# Patient Record
Sex: Male | Born: 1960
Health system: Southern US, Community
[De-identification: ages and names within clinical notes are randomized; demographics above are authoritative.]

## PROBLEM LIST (undated history)

## (undated) DIAGNOSIS — E113511 Type 2 diabetes mellitus with proliferative diabetic retinopathy with macular edema, right eye: Secondary | ICD-10-CM

## (undated) DIAGNOSIS — I10 Essential (primary) hypertension: Secondary | ICD-10-CM

## (undated) DIAGNOSIS — I25118 Atherosclerotic heart disease of native coronary artery with other forms of angina pectoris: Secondary | ICD-10-CM

## (undated) DIAGNOSIS — J069 Acute upper respiratory infection, unspecified: Secondary | ICD-10-CM

## (undated) DIAGNOSIS — E782 Mixed hyperlipidemia: Secondary | ICD-10-CM

## (undated) DIAGNOSIS — Z794 Long term (current) use of insulin: Secondary | ICD-10-CM

## (undated) DIAGNOSIS — E119 Type 2 diabetes mellitus without complications: Secondary | ICD-10-CM

## (undated) DIAGNOSIS — R059 Cough, unspecified: Secondary | ICD-10-CM

## (undated) DIAGNOSIS — I219 Acute myocardial infarction, unspecified: Secondary | ICD-10-CM

## (undated) DIAGNOSIS — Z803 Family history of malignant neoplasm of breast: Secondary | ICD-10-CM

## (undated) DIAGNOSIS — Z8719 Personal history of other diseases of the digestive system: Secondary | ICD-10-CM

## (undated) DIAGNOSIS — E785 Hyperlipidemia, unspecified: Secondary | ICD-10-CM

## (undated) DIAGNOSIS — E039 Hypothyroidism, unspecified: Secondary | ICD-10-CM

## (undated) DIAGNOSIS — C61 Malignant neoplasm of prostate: Secondary | ICD-10-CM

## (undated) DIAGNOSIS — Z800 Family history of malignant neoplasm of digestive organs: Secondary | ICD-10-CM

## (undated) HISTORY — DX: Family history of malignant neoplasm of digestive organs: Z80.0

## (undated) HISTORY — DX: Family history of malignant neoplasm of breast: Z80.3

## (undated) HISTORY — DX: Long term (current) use of insulin: E11.3511

## (undated) HISTORY — DX: Acute upper respiratory infection, unspecified: J06.9

## (undated) HISTORY — DX: Personal history of other diseases of the digestive system: Z87.19

## (undated) HISTORY — DX: Cough, unspecified: R05.9

## (undated) HISTORY — DX: Type 2 diabetes mellitus with proliferative diabetic retinopathy with macular edema, right eye: E11.3511

## (undated) HISTORY — PX: CARDIAC CATHETERIZATION: SHX172

## (undated) HISTORY — PX: SHOULDER SURGERY: SHX246

## (undated) HISTORY — PX: RETINAL DETACHMENT SURGERY: SHX105

## (undated) HISTORY — DX: Atherosclerotic heart disease of native coronary artery with other forms of angina pectoris: I25.118

## (undated) HISTORY — DX: Hypothyroidism, unspecified: E03.9

## (undated) HISTORY — DX: Mixed hyperlipidemia: E78.2

## (undated) HISTORY — PX: APPENDECTOMY: SHX54

## (undated) HISTORY — DX: Long term (current) use of insulin: Z79.4

## (undated) HISTORY — PX: PROSTATE BIOPSY: SHX241

## (undated) MED FILL — Leuprolide Acetate (4 Month) For Subcutaneous Inj Kit 30 MG: SUBCUTANEOUS | Qty: 30 | Status: AC

---

## 1998-05-24 ENCOUNTER — Ambulatory Visit: Admission: RE | Admit: 1998-05-24 | Discharge: 1998-05-24 | Payer: Self-pay | Admitting: Pulmonary Disease

## 1998-08-09 ENCOUNTER — Ambulatory Visit: Admission: RE | Admit: 1998-08-09 | Discharge: 1998-08-09 | Payer: Self-pay | Admitting: Pulmonary Disease

## 2003-02-07 ENCOUNTER — Emergency Department (HOSPITAL_COMMUNITY): Admission: EM | Admit: 2003-02-07 | Discharge: 2003-02-07 | Payer: Self-pay | Admitting: Emergency Medicine

## 2012-12-19 DIAGNOSIS — I219 Acute myocardial infarction, unspecified: Secondary | ICD-10-CM

## 2012-12-19 HISTORY — PX: CARDIAC CATHETERIZATION: SHX172

## 2012-12-19 HISTORY — DX: Acute myocardial infarction, unspecified: I21.9

## 2013-08-02 DIAGNOSIS — E119 Type 2 diabetes mellitus without complications: Secondary | ICD-10-CM | POA: Insufficient documentation

## 2013-08-02 DIAGNOSIS — E785 Hyperlipidemia, unspecified: Secondary | ICD-10-CM | POA: Insufficient documentation

## 2013-08-02 HISTORY — DX: Type 2 diabetes mellitus without complications: E11.9

## 2013-10-04 DIAGNOSIS — I2510 Atherosclerotic heart disease of native coronary artery without angina pectoris: Secondary | ICD-10-CM

## 2013-10-04 HISTORY — DX: Atherosclerotic heart disease of native coronary artery without angina pectoris: I25.10

## 2016-03-19 ENCOUNTER — Emergency Department (HOSPITAL_COMMUNITY)
Admission: EM | Admit: 2016-03-19 | Discharge: 2016-03-19 | Disposition: A | Discharge status: Home / Self Care | Payer: BLUE CROSS/BLUE SHIELD | Attending: Emergency Medicine | Admitting: Emergency Medicine

## 2016-03-19 ENCOUNTER — Encounter (HOSPITAL_COMMUNITY): Payer: Self-pay

## 2016-03-19 DIAGNOSIS — E119 Type 2 diabetes mellitus without complications: Secondary | ICD-10-CM | POA: Insufficient documentation

## 2016-03-19 DIAGNOSIS — S41112A Laceration without foreign body of left upper arm, initial encounter: Secondary | ICD-10-CM | POA: Diagnosis not present

## 2016-03-19 DIAGNOSIS — Z9889 Other specified postprocedural states: Secondary | ICD-10-CM | POA: Diagnosis not present

## 2016-03-19 DIAGNOSIS — Y9389 Activity, other specified: Secondary | ICD-10-CM | POA: Insufficient documentation

## 2016-03-19 DIAGNOSIS — Z23 Encounter for immunization: Secondary | ICD-10-CM | POA: Diagnosis not present

## 2016-03-19 DIAGNOSIS — W208XXA Other cause of strike by thrown, projected or falling object, initial encounter: Secondary | ICD-10-CM | POA: Insufficient documentation

## 2016-03-19 DIAGNOSIS — I252 Old myocardial infarction: Secondary | ICD-10-CM | POA: Insufficient documentation

## 2016-03-19 DIAGNOSIS — I10 Essential (primary) hypertension: Secondary | ICD-10-CM | POA: Diagnosis not present

## 2016-03-19 DIAGNOSIS — S6992XA Unspecified injury of left wrist, hand and finger(s), initial encounter: Secondary | ICD-10-CM | POA: Diagnosis present

## 2016-03-19 DIAGNOSIS — Y998 Other external cause status: Secondary | ICD-10-CM | POA: Insufficient documentation

## 2016-03-19 DIAGNOSIS — S51812A Laceration without foreign body of left forearm, initial encounter: Secondary | ICD-10-CM | POA: Insufficient documentation

## 2016-03-19 DIAGNOSIS — IMO0002 Reserved for concepts with insufficient information to code with codable children: Secondary | ICD-10-CM

## 2016-03-19 DIAGNOSIS — Y9289 Other specified places as the place of occurrence of the external cause: Secondary | ICD-10-CM | POA: Diagnosis not present

## 2016-03-19 HISTORY — DX: Essential (primary) hypertension: I10

## 2016-03-19 HISTORY — DX: Hyperlipidemia, unspecified: E78.5

## 2016-03-19 HISTORY — DX: Acute myocardial infarction, unspecified: I21.9

## 2016-03-19 HISTORY — DX: Type 2 diabetes mellitus without complications: E11.9

## 2016-03-19 MED ORDER — LIDOCAINE-EPINEPHRINE (PF) 2 %-1:200000 IJ SOLN
10.0000 mL | Freq: Once | INTRAMUSCULAR | Status: AC
  Administered 2016-03-19: 10 mL
  Filled 2016-03-19: qty 20

## 2016-03-19 MED ORDER — TETANUS-DIPHTH-ACELL PERTUSSIS 5-2.5-18.5 LF-MCG/0.5 IM SUSP
0.5000 mL | Freq: Once | INTRAMUSCULAR | Status: AC
  Administered 2016-03-19: 0.5 mL via INTRAMUSCULAR
  Filled 2016-03-19: qty 0.5

## 2016-03-19 MED ORDER — CEPHALEXIN 500 MG PO CAPS
500.0000 mg | ORAL_CAPSULE | Freq: Four times a day (QID) | ORAL | Status: DC
Start: 2016-03-19 — End: 2017-07-28

## 2016-03-19 NOTE — Discharge Instructions (Signed)
[name],  Nice meeting you! Please follow-up with your primary care provider or elsewhere to have your sutures removed in 10 days. Return to the emergency department if you develop red streaking around the wound, yellow/green drainage, fevers/chills. Feel better soon!  S. Wendie Simmer, PA-C

## 2016-03-19 NOTE — ED Notes (Signed)
Pt here with a laceration to the left antecubital region. He was removing a window when the window fell and cut his left arm. Pt feels it cut his artery due to blood squirting and bleeding perfusely. Pt denies pain. Seen at [facility] and was sent here. Pt takes Brilinta due to previous MI.

## 2016-03-19 NOTE — ED Provider Notes (Signed)
CSN: [id number]     Arrival date & time 03/19/16  1258 History   First MD Initiated Contact with Patient 03/19/16 1324     Chief Complaint  Patient presents with  . Extremity Laceration   HPI Adam Cruz is a 55 y.o. male PMH significant for MI, DM, hyperlipidemia, HTN presenting with a left antecubital laceration. He states he was removing a window when the window fell and landed on his left arm. The window was still intact. He endorsed arterial bleeding, (blood squirting and bleeding profusely). He was seen at urgent care and sent here for concern for arterial bleeding. He takes Brilinta for a previous MI.   Past Medical History  Diagnosis Date  . MI (myocardial infarction) ([place])   . Diabetes mellitus without complication ([place])   . Hyperlipidemia   . Hypertension    Past Surgical History  Procedure Laterality Date  . Cardiac catheterization    . Appendectomy     History reviewed. No pertinent family history. Social History  Substance Use Topics  . Smoking status: Never Smoker   . Smokeless tobacco: None  . Alcohol Use: No    Review of Systems  Ten systems are reviewed and are negative for acute change except as noted in the HPI  Allergies  Review of patient's allergies indicates no known allergies.  Home Medications   Prior to Admission medications   Not on File   BP 120/81 mmHg  Pulse 93  Temp(Src) 98.3 F (36.8 C) (Oral)  Resp 16  SpO2 96% Physical Exam  Constitutional: He appears well-developed and well-nourished. No distress.  HENT:  Head: Normocephalic and atraumatic.  Eyes: Conjunctivae are normal. Right eye exhibits no discharge. Left eye exhibits no discharge. No scleral icterus.  Neck: No tracheal deviation present.  Cardiovascular: Normal rate, regular rhythm and intact distal pulses.   Pulmonary/Chest: Effort normal. No respiratory distress.  Abdominal: Soft. Bowel sounds are normal. He exhibits no distension and no mass. There is no  tenderness. There is no rebound and no guarding.  Musculoskeletal: Normal range of motion. He exhibits no edema.  NVI BL.   Lymphadenopathy:    He has no cervical adenopathy.  Neurological: He is alert. Coordination normal.  Skin: Skin is warm and dry. No rash noted. He is not diaphoretic. No erythema.  1.5 cm laceration at left antecubital. Bleeding controlled. Large clot visualized. No pulsatile hematoma.   Psychiatric: He has a normal mood and affect. His behavior is normal.  Nursing note and vitals reviewed.   ED Course  .Marland KitchenLaceration Repair Date/Time: 03/19/2016 2:32 PM Performed by: Alisia Ferrari NICOLE Authorized by: [place] Consent: Verbal consent obtained. Consent given by: patient Patient understanding: patient states understanding of the procedure being performed Patient identity confirmed: verbally with patient Body area: upper extremity Location details: left upper arm Laceration length: 1.5 cm Foreign bodies: no foreign bodies Tendon involvement: none Nerve involvement: none Anesthesia: local infiltration Local anesthetic: lidocaine 2% with epinephrine Preparation: Patient was prepped and draped in the usual sterile fashion. Irrigation solution: saline Irrigation method: syringe Amount of cleaning: standard Skin closure: 4-0 Prolene Number of sutures: 3 Technique: simple Approximation: close Approximation difficulty: simple Dressing: antibiotic ointment, 4x4 sterile gauze, gauze roll and pressure dressing Patient tolerance: Patient tolerated the procedure well with no immediate complications       MDM   Final diagnoses:  Laceration   Dr. Tomi Bamberger evaluated patient as well. No concern for current arterial bleeding, as clot is visualized  and bleeding is controlled. Patient is neurovascularly intact BL.  Pressure irrigation performed. Wound explored and base of wound visualized in a bloodless field without evidence of foreign body.  Laceration  occurred < 8 hours prior to repair which was well tolerated. Tdap updated.  Pt discharged with keflex. Discussed suture home care with patient and answered questions. Pt to follow-up for wound check and suture removal in 10-14 days; they are to return to the ED sooner for signs of infection. Pt is hemodynamically stable with no complaints prior to dc.    Ivor Lions, PA-C 03/19/16 1443  Dorie Rank, MD 03/19/16 [phone]

## 2016-04-08 DIAGNOSIS — I10 Essential (primary) hypertension: Secondary | ICD-10-CM | POA: Diagnosis not present

## 2016-04-08 DIAGNOSIS — E039 Hypothyroidism, unspecified: Secondary | ICD-10-CM | POA: Diagnosis not present

## 2016-04-08 DIAGNOSIS — E559 Vitamin D deficiency, unspecified: Secondary | ICD-10-CM | POA: Diagnosis not present

## 2016-04-08 DIAGNOSIS — E1165 Type 2 diabetes mellitus with hyperglycemia: Secondary | ICD-10-CM | POA: Diagnosis not present

## 2016-06-17 DIAGNOSIS — E559 Vitamin D deficiency, unspecified: Secondary | ICD-10-CM | POA: Diagnosis not present

## 2016-06-17 DIAGNOSIS — E785 Hyperlipidemia, unspecified: Secondary | ICD-10-CM | POA: Diagnosis not present

## 2016-06-17 DIAGNOSIS — E1165 Type 2 diabetes mellitus with hyperglycemia: Secondary | ICD-10-CM | POA: Diagnosis not present

## 2016-06-17 DIAGNOSIS — I10 Essential (primary) hypertension: Secondary | ICD-10-CM | POA: Diagnosis not present

## 2016-07-18 DIAGNOSIS — E1165 Type 2 diabetes mellitus with hyperglycemia: Secondary | ICD-10-CM | POA: Diagnosis not present

## 2016-07-18 DIAGNOSIS — I2510 Atherosclerotic heart disease of native coronary artery without angina pectoris: Secondary | ICD-10-CM | POA: Diagnosis not present

## 2016-07-18 DIAGNOSIS — E785 Hyperlipidemia, unspecified: Secondary | ICD-10-CM | POA: Diagnosis not present

## 2016-07-18 DIAGNOSIS — E039 Hypothyroidism, unspecified: Secondary | ICD-10-CM | POA: Diagnosis not present

## 2016-07-21 DIAGNOSIS — H25813 Combined forms of age-related cataract, bilateral: Secondary | ICD-10-CM | POA: Diagnosis not present

## 2016-07-21 DIAGNOSIS — E113213 Type 2 diabetes mellitus with mild nonproliferative diabetic retinopathy with macular edema, bilateral: Secondary | ICD-10-CM | POA: Diagnosis not present

## 2016-08-03 DIAGNOSIS — E113412 Type 2 diabetes mellitus with severe nonproliferative diabetic retinopathy with macular edema, left eye: Secondary | ICD-10-CM | POA: Diagnosis not present

## 2016-08-10 DIAGNOSIS — E113412 Type 2 diabetes mellitus with severe nonproliferative diabetic retinopathy with macular edema, left eye: Secondary | ICD-10-CM | POA: Diagnosis not present

## 2016-09-11 DIAGNOSIS — I252 Old myocardial infarction: Secondary | ICD-10-CM | POA: Diagnosis not present

## 2016-09-11 DIAGNOSIS — T22211A Burn of second degree of right forearm, initial encounter: Secondary | ICD-10-CM | POA: Diagnosis not present

## 2016-09-11 DIAGNOSIS — T24201A Burn of second degree of unspecified site of right lower limb, except ankle and foot, initial encounter: Secondary | ICD-10-CM | POA: Diagnosis not present

## 2016-09-11 DIAGNOSIS — E119 Type 2 diabetes mellitus without complications: Secondary | ICD-10-CM | POA: Diagnosis not present

## 2016-09-11 DIAGNOSIS — T23201A Burn of second degree of right hand, unspecified site, initial encounter: Secondary | ICD-10-CM | POA: Diagnosis not present

## 2016-09-11 DIAGNOSIS — T24231A Burn of second degree of right lower leg, initial encounter: Secondary | ICD-10-CM | POA: Diagnosis not present

## 2016-09-11 DIAGNOSIS — T310 Burns involving less than 10% of body surface: Secondary | ICD-10-CM | POA: Diagnosis not present

## 2016-09-11 DIAGNOSIS — Z955 Presence of coronary angioplasty implant and graft: Secondary | ICD-10-CM | POA: Diagnosis not present

## 2016-09-11 DIAGNOSIS — T2200XA Burn of unspecified degree of shoulder and upper limb, except wrist and hand, unspecified site, initial encounter: Secondary | ICD-10-CM | POA: Diagnosis not present

## 2016-10-04 DIAGNOSIS — T24201D Burn of second degree of unspecified site of right lower limb, except ankle and foot, subsequent encounter: Secondary | ICD-10-CM | POA: Diagnosis not present

## 2016-10-04 DIAGNOSIS — T310 Burns involving less than 10% of body surface: Secondary | ICD-10-CM

## 2016-10-04 DIAGNOSIS — G8911 Acute pain due to trauma: Secondary | ICD-10-CM

## 2016-10-04 HISTORY — DX: Burns involving less than 10% of body surface: T31.0

## 2016-10-04 HISTORY — DX: Acute pain due to trauma: G89.11

## 2016-10-24 DIAGNOSIS — E1140 Type 2 diabetes mellitus with diabetic neuropathy, unspecified: Secondary | ICD-10-CM | POA: Diagnosis not present

## 2016-10-24 DIAGNOSIS — E785 Hyperlipidemia, unspecified: Secondary | ICD-10-CM | POA: Diagnosis not present

## 2016-10-24 DIAGNOSIS — E0851 Diabetes mellitus due to underlying condition with diabetic peripheral angiopathy without gangrene: Secondary | ICD-10-CM | POA: Diagnosis not present

## 2016-10-24 DIAGNOSIS — E1165 Type 2 diabetes mellitus with hyperglycemia: Secondary | ICD-10-CM | POA: Diagnosis not present

## 2016-11-17 DIAGNOSIS — E113412 Type 2 diabetes mellitus with severe nonproliferative diabetic retinopathy with macular edema, left eye: Secondary | ICD-10-CM | POA: Diagnosis not present

## 2016-11-17 DIAGNOSIS — E113213 Type 2 diabetes mellitus with mild nonproliferative diabetic retinopathy with macular edema, bilateral: Secondary | ICD-10-CM | POA: Diagnosis not present

## 2017-02-10 DIAGNOSIS — H6690 Otitis media, unspecified, unspecified ear: Secondary | ICD-10-CM | POA: Diagnosis not present

## 2017-02-10 DIAGNOSIS — J40 Bronchitis, not specified as acute or chronic: Secondary | ICD-10-CM | POA: Diagnosis not present

## 2017-02-21 DIAGNOSIS — E1165 Type 2 diabetes mellitus with hyperglycemia: Secondary | ICD-10-CM | POA: Diagnosis not present

## 2017-02-21 DIAGNOSIS — E039 Hypothyroidism, unspecified: Secondary | ICD-10-CM | POA: Diagnosis not present

## 2017-02-21 DIAGNOSIS — E1140 Type 2 diabetes mellitus with diabetic neuropathy, unspecified: Secondary | ICD-10-CM | POA: Diagnosis not present

## 2017-02-21 DIAGNOSIS — I10 Essential (primary) hypertension: Secondary | ICD-10-CM | POA: Diagnosis not present

## 2017-04-05 DIAGNOSIS — E291 Testicular hypofunction: Secondary | ICD-10-CM | POA: Diagnosis not present

## 2017-04-05 DIAGNOSIS — E1140 Type 2 diabetes mellitus with diabetic neuropathy, unspecified: Secondary | ICD-10-CM | POA: Diagnosis not present

## 2017-04-05 DIAGNOSIS — E785 Hyperlipidemia, unspecified: Secondary | ICD-10-CM | POA: Diagnosis not present

## 2017-04-05 DIAGNOSIS — I10 Essential (primary) hypertension: Secondary | ICD-10-CM | POA: Diagnosis not present

## 2017-05-03 DIAGNOSIS — M9902 Segmental and somatic dysfunction of thoracic region: Secondary | ICD-10-CM | POA: Diagnosis not present

## 2017-05-03 DIAGNOSIS — M9903 Segmental and somatic dysfunction of lumbar region: Secondary | ICD-10-CM | POA: Diagnosis not present

## 2017-05-03 DIAGNOSIS — M9905 Segmental and somatic dysfunction of pelvic region: Secondary | ICD-10-CM | POA: Diagnosis not present

## 2017-05-03 DIAGNOSIS — M5441 Lumbago with sciatica, right side: Secondary | ICD-10-CM | POA: Diagnosis not present

## 2017-05-05 DIAGNOSIS — M9902 Segmental and somatic dysfunction of thoracic region: Secondary | ICD-10-CM | POA: Diagnosis not present

## 2017-05-05 DIAGNOSIS — M5441 Lumbago with sciatica, right side: Secondary | ICD-10-CM | POA: Diagnosis not present

## 2017-05-05 DIAGNOSIS — M9903 Segmental and somatic dysfunction of lumbar region: Secondary | ICD-10-CM | POA: Diagnosis not present

## 2017-05-05 DIAGNOSIS — M9905 Segmental and somatic dysfunction of pelvic region: Secondary | ICD-10-CM | POA: Diagnosis not present

## 2017-05-29 DIAGNOSIS — N521 Erectile dysfunction due to diseases classified elsewhere: Secondary | ICD-10-CM | POA: Diagnosis not present

## 2017-05-29 DIAGNOSIS — I10 Essential (primary) hypertension: Secondary | ICD-10-CM | POA: Diagnosis not present

## 2017-05-29 DIAGNOSIS — E1140 Type 2 diabetes mellitus with diabetic neuropathy, unspecified: Secondary | ICD-10-CM | POA: Diagnosis not present

## 2017-05-29 DIAGNOSIS — Z1389 Encounter for screening for other disorder: Secondary | ICD-10-CM | POA: Diagnosis not present

## 2017-05-29 DIAGNOSIS — E785 Hyperlipidemia, unspecified: Secondary | ICD-10-CM | POA: Diagnosis not present

## 2017-05-29 DIAGNOSIS — Z0000 Encounter for general adult medical examination without abnormal findings: Secondary | ICD-10-CM | POA: Diagnosis not present

## 2017-06-02 DIAGNOSIS — R972 Elevated prostate specific antigen [PSA]: Secondary | ICD-10-CM | POA: Diagnosis not present

## 2017-06-02 DIAGNOSIS — N3000 Acute cystitis without hematuria: Secondary | ICD-10-CM | POA: Diagnosis not present

## 2017-06-08 ENCOUNTER — Other Ambulatory Visit: Payer: Self-pay | Admitting: Urology

## 2017-06-08 DIAGNOSIS — N4232 Atypical small acinar proliferation of prostate: Secondary | ICD-10-CM | POA: Diagnosis not present

## 2017-06-08 DIAGNOSIS — R972 Elevated prostate specific antigen [PSA]: Secondary | ICD-10-CM | POA: Diagnosis not present

## 2017-06-08 DIAGNOSIS — C61 Malignant neoplasm of prostate: Secondary | ICD-10-CM | POA: Diagnosis not present

## 2017-06-15 DIAGNOSIS — C61 Malignant neoplasm of prostate: Secondary | ICD-10-CM | POA: Diagnosis not present

## 2017-06-19 DIAGNOSIS — C61 Malignant neoplasm of prostate: Secondary | ICD-10-CM | POA: Diagnosis not present

## 2017-06-20 DIAGNOSIS — C61 Malignant neoplasm of prostate: Secondary | ICD-10-CM | POA: Diagnosis not present

## 2017-06-26 DIAGNOSIS — C61 Malignant neoplasm of prostate: Secondary | ICD-10-CM | POA: Diagnosis not present

## 2017-07-07 DIAGNOSIS — C61 Malignant neoplasm of prostate: Secondary | ICD-10-CM | POA: Diagnosis not present

## 2017-07-20 ENCOUNTER — Telehealth: Payer: Self-pay | Admitting: Medical Oncology

## 2017-07-20 NOTE — Telephone Encounter (Signed)
I called pt to introduce myself as the Prostate Nurse Navigator and the Coordinator of the Prostate [place].  1. I confirmed with the patient he is aware of his referral to the clinic 07/28/17 arriving between 7:45-8:00 am.  2. I discussed the format of the clinic and the physicians he will be seeing that day.  3. I discussed where the clinic is located and how to contact me.  4. I confirmed his address and informed him I would be mailing a packet of information and forms to be completed. I asked him to bring them with him the day of his appointment.   He voiced understanding of the above. I asked him to call me if he has any questions or concerns regarding his appointments or the forms he needs to complete.

## 2017-07-21 ENCOUNTER — Telehealth: Payer: Self-pay | Admitting: Medical Oncology

## 2017-07-21 NOTE — Telephone Encounter (Signed)
Called and faxed request for prostate biopsy slides for Prostate MDC

## 2017-07-24 ENCOUNTER — Encounter: Payer: Self-pay | Admitting: Medical Oncology

## 2017-07-24 DIAGNOSIS — C61 Malignant neoplasm of prostate: Secondary | ICD-10-CM | POA: Diagnosis not present

## 2017-07-26 ENCOUNTER — Encounter: Payer: Self-pay | Admitting: Radiation Oncology

## 2017-07-26 NOTE — Progress Notes (Signed)
GU Location of Tumor / Histology: prostatic adenocarcinoma  If Prostate Cancer, Gleason Score is (4 + 4) and PSA is (66) in June 2018. Prostate volume: 28.24 grams.   Adam Cruz was referred to Dr. Emi Holes by Dr. Reesa Chew for further evaluation of an elevated PSA and LUTS.  Biopsies of prostate (if applicable) revealed:    Past/Anticipated interventions by urology, if any: prostate biopsy, CT which revealed RP and bulky pelvic LAD. Negative bone scan. Casodex and Lupron. Referral to [facility].   Past/Anticipated interventions by medical oncology, if any: no  Weight changes, if any: no  Bowel/Bladder complaints, if any: urgency, frequency, hesitancy, slow urine stream  Nausea/Vomiting, if any: no  Pain issues, if any:  Back pain  SAFETY ISSUES:  Prior radiation? no  Pacemaker/ICD? no  Possible current pregnancy? no  Is the patient on methotrexate? no  Current Complaints / other details:  56 year old male. Married with one son and one daughter. Strong family hx of ca. NKDA.

## 2017-07-27 ENCOUNTER — Telehealth: Payer: Self-pay | Admitting: Medical Oncology

## 2017-07-27 NOTE — Telephone Encounter (Signed)
Called [name] to confirm appointment for Prostate MDC. I reviewed location, registration and reminded him to bring his completed medical forms. He voiced understanding.

## 2017-07-27 NOTE — Progress Notes (Signed)
Adam Cruz called stating he received a call from [facility] informing him he has an appointment with Dr. Alinda Money at 9 am 07/28/17. He is confused. I explained he will be seeing Dr. Alinda Money in the Prostate [place] here at [facility] 07/28/17. He voiced understanding.

## 2017-07-28 ENCOUNTER — Encounter: Payer: Self-pay | Admitting: Medical Oncology

## 2017-07-28 ENCOUNTER — Encounter: Payer: Self-pay | Admitting: General Practice

## 2017-07-28 ENCOUNTER — Ambulatory Visit
Admission: RE | Admit: 2017-07-28 | Discharge: 2017-07-28 | Disposition: A | Discharge status: Home / Self Care | Payer: BLUE CROSS/BLUE SHIELD | Source: Ambulatory Visit | Attending: Radiation Oncology | Admitting: Radiation Oncology

## 2017-07-28 ENCOUNTER — Encounter: Payer: Self-pay | Admitting: Radiation Oncology

## 2017-07-28 ENCOUNTER — Ambulatory Visit (HOSPITAL_BASED_OUTPATIENT_CLINIC_OR_DEPARTMENT_OTHER): Payer: BLUE CROSS/BLUE SHIELD | Admitting: Oncology

## 2017-07-28 VITALS — BP 136/83 | HR 75 | Resp 16 | Ht 71.5 in | Wt 221.4 lb

## 2017-07-28 DIAGNOSIS — R972 Elevated prostate specific antigen [PSA]: Secondary | ICD-10-CM | POA: Diagnosis not present

## 2017-07-28 DIAGNOSIS — C61 Malignant neoplasm of prostate: Secondary | ICD-10-CM

## 2017-07-28 DIAGNOSIS — Z800 Family history of malignant neoplasm of digestive organs: Secondary | ICD-10-CM

## 2017-07-28 DIAGNOSIS — Z801 Family history of malignant neoplasm of trachea, bronchus and lung: Secondary | ICD-10-CM

## 2017-07-28 DIAGNOSIS — Z803 Family history of malignant neoplasm of breast: Secondary | ICD-10-CM | POA: Diagnosis not present

## 2017-07-28 DIAGNOSIS — Z1501 Genetic susceptibility to malignant neoplasm of breast: Secondary | ICD-10-CM | POA: Diagnosis not present

## 2017-07-28 DIAGNOSIS — Z79818 Long term (current) use of other agents affecting estrogen receptors and estrogen levels: Secondary | ICD-10-CM | POA: Diagnosis not present

## 2017-07-28 DIAGNOSIS — I219 Acute myocardial infarction, unspecified: Secondary | ICD-10-CM | POA: Diagnosis not present

## 2017-07-28 DIAGNOSIS — E119 Type 2 diabetes mellitus without complications: Secondary | ICD-10-CM

## 2017-07-28 DIAGNOSIS — M545 Low back pain: Secondary | ICD-10-CM | POA: Diagnosis not present

## 2017-07-28 DIAGNOSIS — C778 Secondary and unspecified malignant neoplasm of lymph nodes of multiple regions: Secondary | ICD-10-CM

## 2017-07-28 HISTORY — DX: Malignant neoplasm of prostate: C61

## 2017-07-28 MED ORDER — ABIRATERONE ACETATE 250 MG PO TABS: 1000.0000 mg | ORAL_TABLET | Freq: Every day | ORAL | 0 refills | Status: DC

## 2017-07-28 NOTE — Consult Note (Signed)
[facility]     07/28/2017     --------------------------------------------------------------------------------     Adam Cruz   MRN: [id number]  PRIMARY CARE:  [name], MD   DOB: [birth year], 56 year old Male Male  REFERRING:  [name], MD   SSN: [id number]  PROVIDER:  Festus Aloe, M.D.     TREATING:  Raynelle Bring, M.D.     LOCATION:  [facility] [phone]     --------------------------------------------------------------------------------     CC/HPI: CC: Prostate Cancer     Physician requesting consult: Dr. Eda Keys   PCP: Dr. [name]   Location of consult: [facility] - Prostate Cancer [facility]     Adam Cruz is a 56 year old gentleman who was found to have a screening PSA of 66.7. This prompted a urologic referral to Dr. Emi Holes in [place] who performed a TRUS biopsy of the prostate on 06/08/17 that confirmed Gleason 4+4=8 adenocarcinoma. Staging CT imaging revealed large, bulky pelvic and retroperitoneal lymphadenopathy. Bone scan imaging was negative for metastatic disease. He began combined androgen blockade under the care of Dr. Emi Holes in early July with Lupron and bicalutamide apparently.     Family history: He has a mother who was diagnosed with breast cancer in her 48s and a daughter diagnosed with breast cancer at 52 who is BRCA-2 positive.     Imaging studies: As above.     PMH: He has a history of CAD s/p stent placement (on Brillinta), diabetes, hypothyroidism, GERD, hyperlipidemia, and hypertension.   PSH: Laparoscopic appendectomy, cardiac stent placement.     Urinary function: IPSS is 19.   Erectile function: SHIM score is 5.        ALLERGIES: None     MEDICATIONS: Levothyroxine Sodium 125 mcg tablet   Lisinopril 5 mg tablet   Metoprolol Succinate 25 mg tablet, extended release 24 hr   Omeprazole 40 mg capsule,delayed release   Amitriptyline Hcl 25 mg tablet   Aspirin Ec 81  mg tablet, delayed release   Bicalutamide 50 mg tablet   Brilinta 90 mg tablet   Glimepiride 4 mg tablet   Horizant 600 mg tablet, extended release   Jardiance   Rosuvastatin Calcium 40 mg tablet   Soliqua 100-33 100 unit-33 mcg/ml (3 ml) insulin pen   Vitamin D        GU PSH: None     NON-GU PSH: Appendectomy (laparoscopic)  Cardiac Stent Placement  Shoulder Surgery (Unspecified)       GU PMH: Prostate Cancer - 07/07/2017       NON-GU PMH: Diabetes Type 2  GERD  Hypercholesterolemia  Hypertension  Myocardial Infarction       FAMILY HISTORY: Breast Cancer - Grandfather, Daughter, Mother  Cancer - Grandfather  pancreatic cancer - Mother  Tuberculosis - Grandmother     SOCIAL HISTORY: Marital Status: Married  Preferred Language: English  Current Smoking Status: Patient has never smoked.     Tobacco Use Assessment Completed: Used Tobacco in last 30 days?  Drinks 4+ caffeinated drinks per day.       Notes: 1 son, 1 daughter      REVIEW OF SYSTEMS:     GU Review Male:   Patient denies frequent urination, hard to postpone urination, burning/ pain with urination, get up at night to urinate, leakage of urine, stream starts and stops, trouble starting your streams, and have to strain to urinate .   Gastrointestinal (Lower):  Patient denies diarrhea and constipation.   Gastrointestinal (Upper):   Patient denies nausea and vomiting.   Constitutional:   Patient denies fever, night sweats, weight loss, and fatigue.   Skin:   Patient denies skin rash/ lesion and itching.   Eyes:   Patient denies blurred vision and double vision.   Ears/ Nose/ Throat:   Patient denies sore throat and sinus problems.   Hematologic/Lymphatic:   Patient denies swollen glands and easy bruising.   Cardiovascular:   Patient denies leg swelling and chest pains.   Respiratory:   Patient denies cough and shortness of breath.   Endocrine:   Patient denies excessive thirst.   Musculoskeletal:   Patient  denies back pain and joint pain.   Neurological:   Patient denies headaches and dizziness.   Psychologic:   Patient denies depression and anxiety.     VITAL SIGNS: None     MULTI-SYSTEM PHYSICAL EXAMINATION:     Constitutional: Well-nourished. No physical deformities. Normally developed. Good grooming.        PAST DATA REVIEWED:   Source Of History:  Patient   Lab Test Review:   PSA   Records Review:   Pathology Reports, Previous Patient Records   X-Ray Review: C.T. Abdomen/Pelvis: Reviewed Films.   Bone Scan: Reviewed Films.       07/07/17   PSA   Total PSA 11.30 ng/mL      07/07/17   Hormones   Testosterone, Total 13.3 ng/dL       PROCEDURES: None     ASSESSMENT:       ICD-10 Details   1 GU:   Prostate Cancer - C61      PLAN:             Document  Letter(s):  Created for Patient: Clinical Summary            Notes:   1. Metastatic prostate cancer: I had a detailed discuss with Adam Cruz and his wife today. He has met with Dr. [name] earlier and is scheduled to see Dr. Tammi Klippel later this morning.     We discussed that he does not have a curative situation but a very treatable situation. We discussed systemic therapy options and he is interested in proceeding with ADT and abiraterone/prednisone per his discussion with Dr. [name]. We reviewed the side effects of these therapies in detail and I answered multiple questions for him. He wishes to begin therapy as soon as possible. I will notify Dr. Junious Silk of his intent to help coordinate care with Dr. [name]. The patient may want to consolidate his care at the cancer center with Dr. [name].     CC: Dr. Eda Keys   Dr. [name]   Dr. Zola Button   Dr. Tyler Pita       E & M CODE: I spent at least 24 minutes face to face with the patient, more than 50% of that time was spent on counseling and/or coordinating care.            The information contained in this medical record document

## 2017-07-28 NOTE — Progress Notes (Signed)
                               Care Plan Summary  Name: Adam Cruz DOB: 04/08/1961   Your Medical Team:   Urologist -  Dr. Raynelle Bring, [facility]  Radiation Oncologist - Dr. Tyler Pita, [facility]   Medical Oncologist - Dr. Zola Button, [place]  Recommendations: 1) Continue androgen deprivation(hormone therapy)  2) Zytiga(Abiraterone) oral chemotherapy   * These recommendations are based on information available as of today's consult.      Recommendations may change depending on the results of further tests or exams  Next Steps: 1) Dr. [name] will write prescription for [facility] and you will be contacted by Margarito Liner chemotherapy pharmacist 2) Dr. [name] will schedule follow up appointment here at [facility] in 4-6 weeks. 3) Referral to genetic counseling   When appointments need to be scheduled, you will be contacted by [facility] and/or [facility].  Mr. Fenech was given business cards for all providers and a copy of " Fall Prevention Patient Safety Plan". I also gave him printed patient information on Zytiga and asked him to call with questions or concerns.  Questions?  Please do not hesitate to call Cira Rue, RN, BSN, OCN at [phone] any questions or concerns.  Shirlean Mylar is your Oncology Nurse Navigator and is available to assist you while you're receiving your medical care at [facility].

## 2017-07-28 NOTE — Progress Notes (Signed)
Reason for Referral: Prostate cancer.   HPI: 56 year old gentleman currently resides in [place], [place] and uses medical care at [facility]. He has history of diabetes, hyperlipidemia as well as history of MI. He has also a family history of breast cancer that includes male breast cancer in his grandfather, breast cancer in his mother and also breast cancer with his daughter in her 74s. She was found to have a BRCA2 mutation and underwent bilateral mastectomy and hysterectomy.  He was in his usual health and on a routine screening his PSA was noted to be 66 and was referred to urology in [place] and underwent a biopsy by Dr. Emi Holes which showed prostate cancer Gleason score 4+4 = 8 in 3 cores and 4+3 = 7 in 3 other cores. Bone scan did not show any evidence of metastatic disease but CT scan showed bulky adenopathy involving the pelvis and abdomen. No visceral metastasis was noted at the time. He was started on the Lupron at 30 mg in July 2018 as well as bicalutamide.  He was referred by his primary care physician for a second opinion with Dr. Junious Silk and bicalutamide was discontinued. He was also referred to the prostate cancer multidisciplinary clinic for further discussion and opinion. Clinically, he reports very little symptoms at this time. He does report lower back pain especially with immobility. He feels relatively normal when he is active but feels discomfort after sitting for prolonged period of time. He denied any hip pain or discomfort. He denied any weakness or decline in his energy or performance status. He continues to work full time without any decline ability to do so.  He does not report any headaches, blurry vision, syncope or seizures. He does not report any fevers, chills, sweats or weight loss. He does not report any chest pain, palpitation, orthopnea or leg edema. He does not report any cough, wheezing, hemoptysis or shortness of breath. He does not report any nausea,  vomiting or abdominal pain. He does not report any constipation, diarrhea or change in his bowel habits. He does not report any frequency urgency or hesitancy. He has not reported any skeletal complaints. Remaining review of systems unremarkable.    Past Medical History:  Diagnosis Date  . Diabetes mellitus without complication ([place])   . Hyperlipidemia   . Hypertension   . MI (myocardial infarction) ([place])   . Prostate cancer [facility])   :  Past Surgical History:  Procedure Laterality Date  . APPENDECTOMY    . CARDIAC CATHETERIZATION    . PROSTATE BIOPSY    . SHOULDER SURGERY    :   Current Outpatient Prescriptions:  .  abiraterone Acetate (ZYTIGA) 250 MG tablet, Take 4 tablets (1,000 mg total) by mouth daily. Take on an empty stomach 1 hour before or 2 hours after a meal, Disp: 120 tablet, Rfl: 0 .  amitriptyline (ELAVIL) 25 MG tablet, , Disp: , Rfl:  .  aspirin EC 81 MG tablet, TK 1 T PO DAILY, Disp: , Rfl:  .  cephALEXin (KEFLEX) 500 MG capsule, Take 1 capsule (500 mg total) by mouth 4 (four) times daily., Disp: 28 capsule, Rfl: 0 .  Cholecalciferol (VITAMIN D3) 5000 units CAPS, TAKE 1 BY MOUTH DAILY, Disp: , Rfl:  .  empagliflozin (JARDIANCE) 10 MG TABS tablet, , Disp: , Rfl:  .  Gabapentin Enacarbil (HORIZANT) 600 MG TBCR, TK 1 T PO BID, Disp: , Rfl:  .  GLIPIZIDE PO, Take by mouth., Disp: , Rfl:  .  HYDROcodone-acetaminophen (NORCO/VICODIN) 5-325 MG tablet, Take by mouth., Disp: , Rfl:  .  Insulin Glargine-Lixisenatide 100-33 UNT-MCG/ML SOPN, Inject into the skin., Disp: , Rfl:  .  levothyroxine (SYNTHROID, LEVOTHROID) 125 MCG tablet, , Disp: , Rfl:  .  lisinopril (PRINIVIL,ZESTRIL) 5 MG tablet, TAKE 1 TABLET BY MOUTH DAILY, Disp: , Rfl:  .  omeprazole (PRILOSEC) 40 MG capsule, TAKE 1 CAPSULE BY MOUTH ONCE DAILY BEFORE A MEAL IN COMBINATION WITH CLARITHROMYCIN, Disp: , Rfl:  .  rosuvastatin (CRESTOR) 40 MG tablet, , Disp: , Rfl:  .  silver sulfADIAZINE (SILVADENE) 1 % cream,  Apply topically., Disp: , Rfl:  .  ticagrelor (BRILINTA) 90 MG TABS tablet, Take by mouth., Disp: , Rfl: :  No Known Allergies:  Family History  Problem Relation Age of Onset  . Cancer Mother        breast and pancreatic  . Cancer Maternal Grandfather        breast  . Cancer Daughter        breast  :  Social History   Social History  . Marital status: Married    Spouse name: N/A  . Number of children: N/A  . Years of education: N/A   Occupational History  . Not on file.   Social History Main Topics  . Smoking status: Never Smoker  . Smokeless tobacco: Never Used  . Alcohol use No  . Drug use: No  . Sexual activity: No   Other Topics Concern  . Not on file   Social History Narrative  . No narrative on file  :  Pertinent items are noted in HPI.  Exam: ECOG 0.  General appearance: alert and cooperative appeared without distress. Throat: No oral thrush or ulcers. Neck: no adenopathy Back: negative Resp: clear to auscultation bilaterally Cardio: regular rate and rhythm, S1, S2 normal, no murmur, click, rub or gallop GI: soft, non-tender; bowel sounds normal; no masses,  no organomegaly Extremities: extremities normal, atraumatic, no cyanosis or edema Neurological: No deficits noted.    Assessment and Plan:   56 year old gentleman with the following issues:  1. Prostate cancer diagnosed in June 2018. He presented with a PSA of 66 and found to have a Gleason score 4+4 = 8 and three quarters and Gleason score 4+3 = 7 and 3 other cores. His staging workup revealed bulky pelvic lymphadenopathy and bone scan is negative. He was started on androgen deprivation therapy in the form of Lupron in July 2018.  His case was discussed today the prostate cancer multidisciplinary clinic. CT scan and bone scan were discussed with the reviewing radiologist and his pathology was reviewed today with pathology. The natural course of this disease was discussed today with the patient.  He appears to presents with advanced prostate cancer with a high Gleason score and bulky adenopathy. Androgen deprivation therapy is the cornerstone of treating advanced prostate cancer and this was discussed with him today in detail. Complications associated with this therapy including weight gain, hyperlipidemia, hyperglycemia, sexual dysfunction, loss of muscle mass among others. Androgen deprivation therapy could be achieved by continuing Lupron or surgical orchiectomy.  I discussed with him the rationale for the adding therapy to androgen deprivation. I discussed with him the clinical trials alert to the approval of Taxotere chemotherapy as well as Zytiga. Risks and benefits of both those drugs were discussed today in detail and he appears to be a good candidate for both. After discussion, he is willing to proceed with Zytiga at this time.  Total dose will be 1000 mg daily. Prednisone will be withheld given his diabetes and risk of hypoglycemia. Complications associated with this medication include fatigue, weight gain, edema and adrenal insufficiency. The benefit will be improving his overall survival and prolonged disease control. He understands that disease is incurable but could be managed for an extended period of time.  Written information was given to the patient and we will get the process started to get him this medication in the immediate future.  2. Androgen deprivation therapy: This will be continued indefinitely.  3. Genetic counseling: He has clear family history of breast cancer and BRCA mutation. I discussed with him the importance of genetic counseling and testing for this mutation as it might have therapeutic implication in the future. He is agreeable to that and we will make the appropriate referrals.  4. Follow-up: Will be in 4-5 weeks to follow his progress.

## 2017-07-28 NOTE — Progress Notes (Signed)
Radiation Oncology         [phone] ________________________________  Multidisciplinary Prostate Cancer Clinic  Initial Radiation Oncology Consultation  Name: Adam Cruz MRN: [id number]  Date: 07/28/2017  DOB: Aug 31, 1961  XT:KWIOX, [name], MD  Raynelle Bring, MD   REFERRING PHYSICIAN: Raynelle Bring, MD  DIAGNOSIS: 56 y.o. gentleman with stage cT2a cN1 cM0 adenocarcinoma of the prostate with a Gleason's score of 4+4 and a PSA of 66.7.    ICD-10-CM   1. Malignant neoplasm of prostate ([place]) C61     HISTORY OF PRESENT ILLNESS::Adam Cruz Treat is a 56 y.o. gentleman.  He was noted to have an elevated PSA of 66.7 by his primary care physician, Dr. Reesa Chew, Marlene Lard.  Accordingly, he was initially referred for evaluation in urology by Dr. Emi Holes  On 06/02/17,  digital rectal examination was performed at that time revealing a hard, nodular prostate, L>R.  The patient proceeded to transrectal ultrasound with 12 biopsies of the prostate on 06/08/17.  The prostate volume measured 28.24 cc.  Out of 12 core biopsies, 6 were positive.  The maximum Gleason score was 4+4=8 , and this was seen in left base, left mid, left lateral apex, left lateral mid, left lateral base, left apex.  He was started on Casodex and Lupron on 06/20/17.    CT abdomen and pelvis and bone scan were performed on 06/19/2017 for disease staging. CT abdomen and pelvis demonstrated bulky retroperitoneal and pelvic lymphadenopathy consistent with metastatic prostate cancer. There were no findings for osseous metastatic disease. Bone scan was negative for osseous metastatic disease as well.  He was recently evaluated by Dr. Junious Silk on 07/07/17, for a second opinion.  The patient reviewed the biopsy results with Dr. Junious Silk and he has kindly been referred today to the multidisciplinary prostate cancer clinic for presentation of pathology and radiology studies in our conference for discussion of potential radiation treatment options  and clinical evaluation.  PREVIOUS RADIATION THERAPY: No  PAST MEDICAL HISTORY:  has a past medical history of Diabetes mellitus without complication ([place]); Hyperlipidemia; Hypertension; MI (myocardial infarction) ([place]); and Prostate cancer ([place]).    PAST SURGICAL HISTORY: Past Surgical History:  Procedure Laterality Date  . APPENDECTOMY    . CARDIAC CATHETERIZATION    . PROSTATE BIOPSY    . SHOULDER SURGERY      FAMILY HISTORY: family history includes Cancer in his daughter, maternal grandfather, and mother.  SOCIAL HISTORY:  reports that he has never smoked. He has never used smokeless tobacco. He reports that he does not drink alcohol or use drugs.  ALLERGIES: Patient has no known allergies.  MEDICATIONS:  Current Outpatient Prescriptions  Medication Sig Dispense Refill  . abiraterone Acetate (ZYTIGA) 250 MG tablet Take 4 tablets (1,000 mg total) by mouth daily. Take on an empty stomach 1 hour before or 2 hours after a meal 120 tablet 0  . amitriptyline (ELAVIL) 25 MG tablet     . ASPIRIN 81 PO TK 1 T PO QD  6  . bicalutamide (CASODEX) 50 MG tablet TK 1 T PO  D  3  . Cholecalciferol (VITAMIN D3) 5000 units CAPS TAKE 1 BY MOUTH DAILY    . empagliflozin (JARDIANCE) 10 MG TABS tablet     . Gabapentin Enacarbil (HORIZANT) 600 MG TBCR TK 1 T PO BID    . glimepiride (AMARYL) 4 MG tablet TK 1 T PO QD  2  . GLIPIZIDE PO Take by mouth.    Marland Kitchen HYDROcodone-acetaminophen (NORCO/VICODIN) 5-325 MG  tablet Take by mouth.    . Insulin Glargine-Lixisenatide 100-33 UNT-MCG/ML SOPN Inject into the skin.    Marland Kitchen levothyroxine (SYNTHROID, LEVOTHROID) 125 MCG tablet     . lisinopril (PRINIVIL,ZESTRIL) 5 MG tablet TAKE 1 TABLET BY MOUTH DAILY    . metoprolol succinate (TOPROL-XL) 25 MG 24 hr tablet   3  . omeprazole (PRILOSEC) 40 MG capsule TAKE 1 CAPSULE BY MOUTH ONCE DAILY BEFORE A MEAL IN COMBINATION WITH CLARITHROMYCIN    . rosuvastatin (CRESTOR) 40 MG tablet     . silver sulfADIAZINE (SILVADENE) 1  % cream Apply topically.    . ticagrelor (BRILINTA) 90 MG TABS tablet Take by mouth.     No current facility-administered medications for this encounter.     REVIEW OF SYSTEMS:  On review of systems, the patient reports that he is doing well overall. He denies any chest pain, shortness of breath, cough, fevers, chills, night sweats, unintended weight changes. He denies any bowel or bladder disturbances, and denies abdominal pain, nausea or vomiting. He denies any new musculoskeletal or joint aches or pains, new skin lesions or concerns. A complete review of systems is obtained and is otherwise negative.    PHYSICAL EXAM:    height is 5' 11.5" (1.816 m) and weight is 221 lb 6.4 oz (100.4 kg). His blood pressure is 136/83 and his pulse is 75. His respiration is 16 and oxygen saturation is 100%.   In general this is a well appearing caucasian male in no acute distress. He is alert and oriented x4 and appropriate throughout the examination. HEENT reveals that the patient is normocephalic, atraumatic. EOMs are intact. PERRLA. Skin is intact without any evidence of gross lesions. Cardiovascular exam reveals a regular rate and rhythm, no clicks rubs or murmurs are auscultated. Chest is clear to auscultation bilaterally. Lymphatic assessment is performed and does not reveal any adenopathy in the cervical, supraclavicular, axillary, or inguinal chains. Abdomen has active bowel sounds in all quadrants and is intact. The abdomen is soft, non tender, non distended. Lower extremities are negative for pretibial pitting edema, deep calf tenderness, cyanosis or clubbing. Please note the digital rectal exam findings described above.  KPS = 100  100 - Normal; no complaints; no evidence of disease. 90   - Able to carry on normal activity; minor signs or symptoms of disease. 80   - Normal activity with effort; some signs or symptoms of disease. 10   - Cares for self; unable to carry on normal activity or to do active  work. 60   - Requires occasional assistance, but is able to care for most of his personal needs. 50   - Requires considerable assistance and frequent medical care. 58   - Disabled; requires special care and assistance. 17   - Severely disabled; hospital admission is indicated although death not imminent. 103   - Very sick; hospital admission necessary; active supportive treatment necessary. 10   - Moribund; fatal processes progressing rapidly. 0     - Dead  Karnofsky DA, Abelmann WH, Craver LS and Burchenal JH [phone]) The use of the nitrogen mustards in the palliative treatment of carcinoma: with particular reference to bronchogenic carcinoma Cancer 1 634-56   LABORATORY DATA:  No results found for: WBC, HGB, HCT, MCV, PLT No results found for: NA, K, CL, CO2 No results found for: ALT, AST, GGT, ALKPHOS, BILITOT   RADIOGRAPHY: No results found.    IMPRESSION/PLAN: 56 y.o. gentleman with stage cT2a cN1 cM0 adenocarcinoma  of the prostate with a Gleason's score of 4+4 and a PSA of 66.7.  We talked to the patient and his wife about the findings and workup thus far. We discussed the natural history of Stage IVA prostate carcinoma and general treatment, highlighting the role of radiotherapy in the management. We discussed the available radiation techniques, and focused on the details of logistics and delivery. We reviewed the anticipated acute and late sequelae associated with radiation in this setting. The patient was encouraged to ask questions that were answered to his satisfaction.  Consensus at prostate [place] is that the patient will most likely benefit from LT-ADT in combination with systemic Zytiga.  We will therefore standby and be available to offer radiotherapy if indicated. Otherwise, he will continue to follow-up with Dr. [name] for disease management.     Nicholos Johns, PA-C    Tyler Pita, MD  [facility] Direct Dial: [phone]  Fax:  [phone] [email or web address]  Skype  [email or web address]     This document serves as a record of services personally performed by Tyler Pita MD. It was created on his behalf by Delton Coombes, a trained medical scribe. The creation of this record is based on the scribe's personal observations and the provider's statements to them. This document has been checked and approved by the attending provider.

## 2017-07-28 NOTE — Progress Notes (Signed)
[place] Psychosocial Distress Screening Spiritual Care  Met with Eduardo and spouse Jenny Reichmann in [facility] to introduce  team/resources, reviewing distress screen per protocol.  The patient scored a 5 on the Psychosocial Distress Thermometer which indicates moderate distress. Also assessed for distress and other psychosocial needs.   ONCBCN DISTRESS SCREENING 07/28/2017  Screening Type Initial Screening  Distress experienced in past week (1-10) 5  Emotional problem type Nervousness/Anxiety;Adjusting to illness  Physical Problem type Pain;Sleep/insomnia  Referral to support programs Yes   Per pt and spouse, anxiety from facing the unknown related to dx/tx has been his greatest stressor recently.  Jenny Reichmann noted a shift in how he was talking about his situation even just between their drive to [facility] and their experience at the end of [facility].  He agreed that gaining additional information and a care plan is helping him cope.   Introduced [employer] in the context of empathic listening, normalization of feelings, and encouraging layers of support through this experience.   Follow up needed: Yes.  Family knows to contact Team with questions or concerns at any time.  Plan to send a f/u note of encouragement, as well.   [place], [place], [facility] Pager [phone] Voicemail [phone]

## 2017-07-31 ENCOUNTER — Telehealth: Payer: Self-pay | Admitting: Pharmacist

## 2017-07-31 ENCOUNTER — Telehealth: Payer: Self-pay

## 2017-07-31 ENCOUNTER — Encounter: Payer: Self-pay | Admitting: *Deleted

## 2017-07-31 DIAGNOSIS — C61 Malignant neoplasm of prostate: Secondary | ICD-10-CM

## 2017-07-31 NOTE — Telephone Encounter (Signed)
Spoke with patient and he is aware of his new appt 9/18 per 8/10 inbasket

## 2017-07-31 NOTE — Telephone Encounter (Signed)
Oral Oncology Pharmacist Encounter  Received new prescription for Zytiga for the treatment of advanced, high-risk, androgen-sensitive prostate cancer in conjunction with Lupron, planned duration until disease progression or unacceptable toxicity.  CMET from 6/18 scanned into Epic assessed, OK for treatment.  Current medication list in Epic reviewed, DDIs with Zytiga identified:  Zytiga and metoprolol: category D interaction: Zytiga inhibits CYP2D6, thereby increasing systemic exposure to metoprolol, noted patient on low dose metoprolol. BPs and HRs in Epic assessed, no bradycardia or hypotension noted, no change to current therapy indicated at this time. BP and HR will continue to be monitored.  Zytiga and amitriptyline: category D interaction: Due to CYP2D6 inhibition by Zytiga, amitriptyline systemic exposure may be increased, and amitriptyline has a narrow therapeutic index. Patient will be monitored for increased amitriptyline side effects.  Prior authorization will be submitted to patient's insurance for approval prior to sending prescription to [facility] (ph: [phone]) where it will have to be filled per insurance requirement.  Oral Oncology Clinic will continue to follow for insurance authorization, copayment issues, initial counseling and start date.  [name], PharmD, BCPS, BCOP 07/31/2017 3:15 PM Oral Oncology Clinic [phone]

## 2017-07-31 NOTE — Addendum Note (Signed)
Addended by: Wyatt Portela on: 07/31/2017 08:37 AM   Modules accepted: Orders

## 2017-08-01 ENCOUNTER — Encounter: Payer: Self-pay | Admitting: Medical Oncology

## 2017-08-01 ENCOUNTER — Telehealth: Payer: Self-pay | Admitting: Medical Oncology

## 2017-08-01 NOTE — Telephone Encounter (Signed)
Adam Cruz called stating he has not heard from [facility] with update on his Zytiga. I explained that the authorization process can take days but I will be happy to ask [name] to call with an update. He and his wife are planning to go to [place] Thursday, but he will cancel the trip if he can get his medication. I explained that he has received Lupron which is treatment and he can still go and start the [place] upon returning. He voiced understanding and looks forward to hearing from [facility].

## 2017-08-01 NOTE — Telephone Encounter (Signed)
Oral Chemotherapy Pharmacist Encounter   I spoke with patient for overview of new oral chemotherapy medication: Zytiga for the treatment of advanced, high-risk, androgen-sensitive prostate cancer in conjunction with Lupron, planned duration until disease progression or unacceptable toxicity.  Pt is doing well. The prescription has been faxed to [facility] (ph: [phone]) per insurance requirement.   Counseled patient on administration, dosing, side effects, safe handling, and monitoring. Patient will take Zytiga 250mg  tablets, 4 tablets by mouth once daily on an empty stomach, 1 hr before or 2 hrs after a meal.  Side effects include but not limited to: fatigue, LE edema, hypertension, arthralgias and hot flushes.   Discussed medication interactions with metoprolol and amitriptyline. Patient knows to be aware of increased side effects and call the office if he experiences hypotension, bradycardia, increased somnolence, urinary retention, and increased thirst.  Reviewed with patient importance of keeping a medication schedule and plan for any missed doses.  Mr. Stfleur voiced understanding and appreciation.   All questions answered.  Will follow up with patient regarding copayment issues and start date. Provided patient with pharmacy number and Oral Oncology Clinic.  Patient knows to call the office with questions or concerns. Oral Oncology Clinic will continue to follow.  Thank you,  [name], PharmD, BCPS, BCOP 08/01/2017  10:11 AM Oral Oncology Clinic [phone]

## 2017-08-01 NOTE — Progress Notes (Signed)
I spoke with Margarito Liner chemotherapy pharmacist regarding patient's concern of approval and approximate date of receiving the medication. She will call Mr. Ridlon with an update on status of approval.

## 2017-08-02 ENCOUNTER — Telehealth: Payer: Self-pay | Admitting: Oncology

## 2017-08-02 ENCOUNTER — Encounter: Payer: Self-pay | Admitting: General Practice

## 2017-08-02 NOTE — Progress Notes (Signed)
[place] Spiritual Care Note  Mailed f/u note of support and encouragement to Adam Cruz and wife Adam Cruz.  Encouraged them to contact Support Team anytime with needs or questions.   [place], [place], [facility] Pager [phone] Voicemail [phone]

## 2017-08-02 NOTE — Telephone Encounter (Signed)
sw pt to confirm genetics appt 8/29 at 1300 per sch msg

## 2017-08-03 NOTE — Telephone Encounter (Signed)
Oral Chemotherapy Pharmacist Encounter  I called [facility] (ph: [phone]) to ensure receipt of Zytiga prescription faxed on 08/01/17.  Prescription is being processed and they have already spoken with patient to schedule delivery.  Fabio Asa will be delivered to patient's home on Monday 08/07/17 per patient request as he is out of town this weekend. Copayment $10  Oral Oncology Clinic will continue to follow.  [name], PharmD, BCPS, BCOP 08/03/2017  12:43 PM Oral Oncology Clinic [phone]

## 2017-08-10 ENCOUNTER — Telehealth: Payer: Self-pay | Admitting: Medical Oncology

## 2017-08-10 NOTE — Telephone Encounter (Signed)
[name] called stating that he is having major issues with constipation. He started [place] and is not sure if this is related. I transferred call to [facility]- Dr. Hazeline Junker nurse.

## 2017-08-16 ENCOUNTER — Ambulatory Visit (HOSPITAL_BASED_OUTPATIENT_CLINIC_OR_DEPARTMENT_OTHER): Payer: BLUE CROSS/BLUE SHIELD | Admitting: Genetic Counselor

## 2017-08-16 ENCOUNTER — Encounter: Payer: Self-pay | Admitting: Genetic Counselor

## 2017-08-16 ENCOUNTER — Other Ambulatory Visit: Payer: BLUE CROSS/BLUE SHIELD

## 2017-08-16 DIAGNOSIS — Z800 Family history of malignant neoplasm of digestive organs: Secondary | ICD-10-CM | POA: Diagnosis not present

## 2017-08-16 DIAGNOSIS — C61 Malignant neoplasm of prostate: Secondary | ICD-10-CM | POA: Diagnosis not present

## 2017-08-16 DIAGNOSIS — Z315 Encounter for genetic counseling: Secondary | ICD-10-CM | POA: Diagnosis not present

## 2017-08-16 DIAGNOSIS — Z803 Family history of malignant neoplasm of breast: Secondary | ICD-10-CM | POA: Diagnosis not present

## 2017-08-16 NOTE — Progress Notes (Signed)
REFERRING PROVIDER: Wyatt Portela, MD [place], [place] [address]  PRIMARY PROVIDER:  [name], MD  PRIMARY REASON FOR VISIT:  1. Malignant neoplasm of prostate ([place])   2. Family history of breast cancer   3. Family history of breast cancer in male   2. Family history of pancreatic cancer      HISTORY OF PRESENT ILLNESS:   Adam Cruz, a 56 y.o. male, was seen for a [place] cancer genetics consultation at the request of Dr. [name] due to a personal and family history of cancer.  Adam Cruz presents to clinic today to discuss the possibility of a hereditary predisposition to cancer, genetic testing, and to further clarify his future cancer risks, as well as potential cancer risks for family members.   In 2018, at the age of 57, Adam Cruz was diagnosed with prostate cancer.  His Gleason score is 8. This is treated with oral chemotherapy.      CANCER HISTORY:   No history exists.     RISK FACTORS:  Dermatology: No Colonoscopy: yes; a few polyps. PSA: 66 Gleason score: 8  Past Medical History:  Diagnosis Date  . Diabetes mellitus without complication ([place])   . Family history of breast cancer   . Family history of breast cancer in male   . Family history of pancreatic cancer   . Hyperlipidemia   . Hypertension   . MI (myocardial infarction) ([place])   . Prostate cancer [facility])     Past Surgical History:  Procedure Laterality Date  . APPENDECTOMY    . CARDIAC CATHETERIZATION    . PROSTATE BIOPSY    . SHOULDER SURGERY      Social History   Social History  . Marital status: Married    Spouse name: N/A  . Number of children: N/A  . Years of education: N/A   Social History Main Topics  . Smoking status: Never Smoker  . Smokeless tobacco: Never Used  . Alcohol use No  . Drug use: No  . Sexual activity: No   Other Topics Concern  . None   Social History Narrative  . None     FAMILY HISTORY:  We obtained a detailed,  4-generation family history.  Significant diagnoses are listed below: Family History  Problem Relation Age of Onset  . Breast cancer Mother 77  . Pancreatic cancer Mother 90  . Breast cancer Maternal Grandfather        dx in his 22s  . Breast cancer Daughter 22       reportedly BRCA pos  . Cirrhosis Father 84       non alcoholic cirrhosis    The patient has a son and daughter. His daughter developed breast cancer at 68 and was reportedly diagnosed with a BRCA mutation.  He has one brother who has two boys and a girl.  Both parents are deceased.  The patient's mother developed breast cancer at 59 and pancreatic cancer at 54.  She has a brother and sister, who are cancer free.  Their children are also cancer free.  Both maternal grandparents are deceased.  The grandmother died at 58 and grandfather died from breast cancer at 8.  He had several siblings who had cancer.  The patient's father had seven sisters and a brother.  None had cancer.  The paternal grandparents are both deceased from old age.  Patient's maternal ancestors are of Caucasian descent, and paternal ancestors are of Caucasian descent. There is  no reported Ashkenazi Jewish ancestry. There is no known consanguinity.  GENETIC COUNSELING ASSESSMENT: Adam Cruz is a 56 y.o. male with a personal history of prostate cancer and family history of breast and pancreatic cancer which is somewhat suggestive of a hereditary breast and ovarian cancer syndrome and predisposition to cancer. We, therefore, discussed and recommended the following at today's visit.   DISCUSSION: We discussed that about 5-10% of breast cancer is hereditary with most cases due to BRCA mutations.  Other genes as also associated with breast, male breast, and pancreatic cancer, including PALB2.  ATM and CHEK2 are other genes associated with hereditary breast cancer syndromes.  We do not have a copy of his daughter's genetic testing, and she is currently on  vacation.  In order to get his test results back, we will perform a panel test.  We reviewed the characteristics, features and inheritance patterns of hereditary cancer syndromes. We also discussed genetic testing, including the appropriate family members to test, the process of testing, insurance coverage and turn-around-time for results. We discussed the implications of a negative, positive and/or variant of uncertain significant result. We recommended Adam Cruz pursue genetic testing for the common hereditary cancer panel and pancreatic cancer gene panel. The Hereditary Gene Panel offered by [facility] includes sequencing and/or deletion duplication testing of the following 46 genes: APC, ATM, AXIN2, BARD1, BMPR1A, BRCA1, BRCA2, BRIP1, CDH1, CDKN2A (p14ARF), CDKN2A (p16INK4a), CHEK2, CTNNA1, DICER1, EPCAM (Deletion/duplication testing only), GREM1 (promoter region deletion/duplication testing only), KIT, MEN1, MLH1, MSH2, MSH3, MSH6, MUTYH, NBN, NF1, NHTL1, PALB2, PDGFRA, PMS2, POLD1, POLE, PTEN, RAD50, RAD51C, RAD51D, SDHB, SDHC, SDHD, SMAD4, SMARCA4. STK11, TP53, TSC1, TSC2, and VHL.  The following genes were evaluated for sequence changes only: SDHA and HOXB13 c.251G>A variant only.  The Pancreatic Cancer gene panel offered by [facility] and includes sequencing and rearrangement analysis for the following 12 genes: APC, ATM, BRCA1, BRCA2, CDK4, CDKN2A, EPCAM, MLH1, MSH2, MSH6, PALB2, PMS2, STK11, TP53, VHL, and XRCC2.     Based on Adam Cruz personal and family history of cancer, he meets medical criteria for genetic testing. Despite that he meets criteria, he may still have an out of pocket cost. We discussed that if his out of pocket cost for testing is over $100, the laboratory will call and confirm whether he wants to proceed with testing.  If the out of pocket cost of testing is less than $100 he will be billed by the genetic testing laboratory.   PLAN: After considering the risks, benefits, and  limitations, Adam Cruz  provided informed consent to pursue genetic testing and the blood sample was sent to [facility] for analysis of the Common Hereditary cancer panel and pancreatic cancer panel. Results should be available within approximately 2-3 weeks' time, at which point they will be disclosed by telephone to Adam Cruz, as will any additional recommendations warranted by these results. Adam Cruz will receive a summary of his genetic counseling visit and a copy of his results once available. This information will also be available in Epic. We encouraged Adam Cruz to remain in contact with cancer genetics annually so that we can continuously update the family history and inform him of any changes in cancer genetics and testing that may be of benefit for his family. Adam Cruz questions were answered to his satisfaction today. Our contact information was provided should additional questions or concerns arise.  Lastly, we encouraged Adam Cruz to remain in contact with cancer genetics annually so that we can  continuously update the family history and inform him of any changes in cancer genetics and testing that may be of benefit for this family.   Mr.  Cruz questions were answered to his satisfaction today. Our contact information was provided should additional questions or concerns arise. Thank you for the referral and allowing [place] to share in the care of your patient.   Sherilee Smotherman P. [name], [place], [facility] Certified Genetic Counselor [email or web address] phone: [phone]  The patient was seen for a total of 55 minutes in face-to-face genetic counseling.  This patient was discussed with Drs. Magrinat, Lindi Adie and/or Burr Medico who agrees with the above.    _______________________________________________________________________ For Office Staff:  Number of people involved in session: 1 Was an Intern/ student involved with case: no

## 2017-08-17 ENCOUNTER — Telehealth: Payer: Self-pay | Admitting: Medical Oncology

## 2017-08-17 NOTE — Telephone Encounter (Signed)
Adam Cruz states he is doing well except his joints ache and some back pain. This is not new but has gotten slightly worse. Last week he was having major issues with constipation but states that he has that under control. He is tolerating Zytiga well and will follow up with Dr. [name] 09/05/17. I asked him to call me with questions or concerns. He voiced understanding.

## 2017-08-22 ENCOUNTER — Encounter: Payer: Self-pay | Admitting: Medical Oncology

## 2017-08-22 ENCOUNTER — Other Ambulatory Visit: Payer: Self-pay | Admitting: *Deleted

## 2017-08-22 DIAGNOSIS — C61 Malignant neoplasm of prostate: Secondary | ICD-10-CM

## 2017-08-22 MED ORDER — ABIRATERONE ACETATE 250 MG PO TABS: 1000.0000 mg | ORAL_TABLET | Freq: Every day | ORAL | 0 refills | Status: DC

## 2017-08-30 DIAGNOSIS — E291 Testicular hypofunction: Secondary | ICD-10-CM | POA: Diagnosis not present

## 2017-08-30 DIAGNOSIS — I10 Essential (primary) hypertension: Secondary | ICD-10-CM | POA: Diagnosis not present

## 2017-08-30 DIAGNOSIS — M7989 Other specified soft tissue disorders: Secondary | ICD-10-CM | POA: Diagnosis not present

## 2017-08-30 DIAGNOSIS — E1140 Type 2 diabetes mellitus with diabetic neuropathy, unspecified: Secondary | ICD-10-CM | POA: Diagnosis not present

## 2017-09-04 DIAGNOSIS — M79605 Pain in left leg: Secondary | ICD-10-CM | POA: Diagnosis not present

## 2017-09-04 DIAGNOSIS — M7989 Other specified soft tissue disorders: Secondary | ICD-10-CM | POA: Diagnosis not present

## 2017-09-05 ENCOUNTER — Ambulatory Visit (HOSPITAL_BASED_OUTPATIENT_CLINIC_OR_DEPARTMENT_OTHER): Payer: BLUE CROSS/BLUE SHIELD | Admitting: Oncology

## 2017-09-05 ENCOUNTER — Other Ambulatory Visit (HOSPITAL_BASED_OUTPATIENT_CLINIC_OR_DEPARTMENT_OTHER): Payer: BLUE CROSS/BLUE SHIELD

## 2017-09-05 ENCOUNTER — Telehealth: Payer: Self-pay | Admitting: Oncology

## 2017-09-05 VITALS — BP 156/84 | HR 73 | Temp 98.2°F | Resp 20 | Ht 71.5 in | Wt 225.5 lb

## 2017-09-05 DIAGNOSIS — R61 Generalized hyperhidrosis: Secondary | ICD-10-CM | POA: Diagnosis not present

## 2017-09-05 DIAGNOSIS — C778 Secondary and unspecified malignant neoplasm of lymph nodes of multiple regions: Secondary | ICD-10-CM

## 2017-09-05 DIAGNOSIS — E291 Testicular hypofunction: Secondary | ICD-10-CM

## 2017-09-05 DIAGNOSIS — C61 Malignant neoplasm of prostate: Secondary | ICD-10-CM | POA: Diagnosis not present

## 2017-09-05 LAB — CBC WITH DIFFERENTIAL/PLATELET
BASO%: 0.7 % (ref 0.0–2.0)
Basophils Absolute: 0.1 10*3/uL (ref 0.0–0.1)
EOS%: 8.2 % — ABNORMAL HIGH (ref 0.0–7.0)
Eosinophils Absolute: 0.6 10*3/uL — ABNORMAL HIGH (ref 0.0–0.5)
HCT: 39.6 % (ref 38.4–49.9)
HGB: 13.1 g/dL (ref 13.0–17.1)
LYMPH#: 1.7 10*3/uL (ref 0.9–3.3)
LYMPH%: 25.2 % (ref 14.0–49.0)
MCH: 27.8 pg (ref 27.2–33.4)
MCHC: 33.1 g/dL (ref 32.0–36.0)
MCV: 84.1 fL (ref 79.3–98.0)
MONO#: 1 10*3/uL — ABNORMAL HIGH (ref 0.1–0.9)
MONO%: 14.1 % — AB (ref 0.0–14.0)
NEUT#: 3.5 10*3/uL (ref 1.5–6.5)
NEUT%: 51.8 % (ref 39.0–75.0)
Platelets: 170 10*3/uL (ref 140–400)
RBC: 4.71 10*6/uL (ref 4.20–5.82)
RDW: 14.6 % (ref 11.0–14.6)
WBC: 6.7 10*3/uL (ref 4.0–10.3)

## 2017-09-05 LAB — COMPREHENSIVE METABOLIC PANEL
ALT: 24 U/L (ref 0–55)
AST: 30 U/L (ref 5–34)
Albumin: 3.6 g/dL (ref 3.5–5.0)
Alkaline Phosphatase: 71 U/L (ref 40–150)
Anion Gap: 9 mEq/L (ref 3–11)
BILIRUBIN TOTAL: 0.43 mg/dL (ref 0.20–1.20)
BUN: 13.2 mg/dL (ref 7.0–26.0)
CALCIUM: 9.9 mg/dL (ref 8.4–10.4)
CHLORIDE: 111 meq/L — AB (ref 98–109)
CO2: 21 meq/L — AB (ref 22–29)
CREATININE: 1 mg/dL (ref 0.7–1.3)
EGFR: 84 mL/min/{1.73_m2} — AB (ref >90)
Glucose: 199 mg/dl — ABNORMAL HIGH (ref 70–140)
Potassium: 3.5 mEq/L (ref 3.5–5.1)
Sodium: 141 mEq/L (ref 136–145)
TOTAL PROTEIN: 6.7 g/dL (ref 6.4–8.3)

## 2017-09-05 NOTE — Progress Notes (Signed)
Hematology and Oncology Follow Up Visit  TAE VONADA [id number] 1961-04-17 56 y.o. 09/05/2017 4:02 PM [name], MDSaeed, [name], MD   Principle Diagnosis: Prostate cancer diagnosed in June 2018. He presented with a PSA of 66 and found to have a Gleason score 4+4 = 8 in 3 cores and Gleason score 4+3 = 7 and 3 other cores. His staging workup revealed bulky pelvic lymphadenopathy and bone scan is negative.   Prior Therapy:  Androgen deprivation therapy started in July 2018.  Current therapy: He continues to receive androgen deprivation therapy at [facility] urology. Zytiga 1000 mg daily started in August 2018.  Interim History: Mr. Karger presents today for a follow-up visit. Since the last visit, he started Zytiga at 1000 mg daily and reports no complications related to it. He denied any nausea, excessive fatigue or vomiting. He is eating well without any decline in his appetite. He does not report any bone pain or pathological fractures. He does report some occasional arthralgia in his knees. He does report some hot flashes related to androgen deprivation.  Medications: I have reviewed the patient's current medications.  Current Outpatient Prescriptions  Medication Sig Dispense Refill  . abiraterone Acetate (ZYTIGA) 250 MG tablet Take 4 tablets (1,000 mg total) by mouth daily. Take on an empty stomach 1 hour before or 2 hours after a meal 120 tablet 0  . amitriptyline (ELAVIL) 25 MG tablet     . ASPIRIN 81 PO TK 1 T PO QD  6  . bicalutamide (CASODEX) 50 MG tablet TK 1 T PO  D  3  . Cholecalciferol (VITAMIN D3) 5000 units CAPS TAKE 1 BY MOUTH DAILY    . empagliflozin (JARDIANCE) 10 MG TABS tablet     . Gabapentin Enacarbil (HORIZANT) 600 MG TBCR TK 1 T PO BID    . glimepiride (AMARYL) 4 MG tablet TK 1 T PO QD  2  . GLIPIZIDE PO Take by mouth.    Marland Kitchen HYDROcodone-acetaminophen (NORCO/VICODIN) 5-325 MG tablet Take by mouth.    . Insulin Glargine-Lixisenatide 100-33 UNT-MCG/ML SOPN  Inject into the skin.    Marland Kitchen levothyroxine (SYNTHROID, LEVOTHROID) 125 MCG tablet     . lisinopril (PRINIVIL,ZESTRIL) 5 MG tablet TAKE 1 TABLET BY MOUTH DAILY    . metoprolol succinate (TOPROL-XL) 25 MG 24 hr tablet   3  . omeprazole (PRILOSEC) 40 MG capsule TAKE 1 CAPSULE BY MOUTH ONCE DAILY BEFORE A MEAL IN COMBINATION WITH CLARITHROMYCIN    . rosuvastatin (CRESTOR) 40 MG tablet     . silver sulfADIAZINE (SILVADENE) 1 % cream Apply topically.    . ticagrelor (BRILINTA) 90 MG TABS tablet Take by mouth.     No current facility-administered medications for this visit.      Allergies: No Known Allergies  Past Medical History, Surgical history, Social history, and Family History were reviewed and updated.  Physical Exam: Blood pressure (!) 156/84, pulse 73, temperature 98.2 F (36.8 C), temperature source Oral, resp. rate 20, height 5' 11.5" (1.816 m), weight 225 lb 8 oz (102.3 kg), SpO2 98 %. ECOG: 0 General appearance: alert and cooperative appeared without distress. Head: Normocephalic, without obvious abnormality without oral thrush. Neck: no adenopathy Lymph nodes: Cervical, supraclavicular, and axillary nodes normal. Heart:regular rate and rhythm, S1, S2 normal, no murmur, click, rub or gallop Lung:chest clear, no wheezing, rales, normal symmetric air entry Abdomin: soft, non-tender, without masses or organomegaly EXT:no erythema, induration, or nodules   Lab Results: Lab Results  Component Value Date  WBC 6.7 09/05/2017   HGB 13.1 09/05/2017   HCT 39.6 09/05/2017   MCV 84.1 09/05/2017   PLT 170 09/05/2017     Chemistry      Component Value Date/Time   NA 141 09/05/2017 1510   K 3.5 09/05/2017 1510   CO2 21 (L) 09/05/2017 1510   BUN 13.2 09/05/2017 1510   CREATININE 1.0 09/05/2017 1510      Component Value Date/Time   CALCIUM 9.9 09/05/2017 1510   ALKPHOS 71 09/05/2017 1510   AST 30 09/05/2017 1510   ALT 24 09/05/2017 1510   BILITOT 0.43 09/05/2017 1510        Impression and Plan:  56 year old gentleman with the following issues:  1. Prostate cancer diagnosed in June 2018. He presented with a PSA of 66 and found to have a Gleason score 4+4 = 8 and three quarters and Gleason score 4+3 = 7 and 3 other cores. His staging workup revealed bulky pelvic lymphadenopathy and bone scan is negative.   He was started on androgen deprivation therapy in the form of Lupron in July 2018.  Zytiga 1000 mg daily added to androgen deprivation therapy in August 2018. He completed 1 month of therapy without complications. The plan is to continue with the same dose and schedule without any dose reduction or delay.  2. Androgen deprivation therapy: This will be continued indefinitely.  3. Genetic counseling: He has clear family history of breast cancer and BRCA mutation. He completed genetic counseling and BRCA testing. Results are not available to me at this time.  4. Follow-up: Will be in 4-5 weeks to follow his progress.  Zola Button, MD 9/18/20184:02 PM

## 2017-09-05 NOTE — Telephone Encounter (Signed)
Gave avs and calendar for October  °

## 2017-09-06 ENCOUNTER — Telehealth: Payer: Self-pay | Admitting: *Deleted

## 2017-09-06 LAB — PSA: Prostate Specific Ag, Serum: <0.1 ng/mL (ref 0.0–4.0)

## 2017-09-06 NOTE — Telephone Encounter (Signed)
-----   Message from Wyatt Portela, MD sent at 09/06/2017  9:39 AM EDT ----- Please let him know his PSA is down.

## 2017-09-06 NOTE — Telephone Encounter (Signed)
As noted below by Dr. Shadad, I informed patient of his PSA level. Patient verbalized understanding. 

## 2017-09-08 ENCOUNTER — Encounter: Payer: Self-pay | Admitting: Genetic Counselor

## 2017-09-08 ENCOUNTER — Telehealth: Payer: Self-pay | Admitting: Genetic Counselor

## 2017-09-08 DIAGNOSIS — Z1501 Genetic susceptibility to malignant neoplasm of breast: Secondary | ICD-10-CM | POA: Insufficient documentation

## 2017-09-08 DIAGNOSIS — Z1509 Genetic susceptibility to other malignant neoplasm: Secondary | ICD-10-CM

## 2017-09-08 DIAGNOSIS — Z1379 Encounter for other screening for genetic and chromosomal anomalies: Secondary | ICD-10-CM

## 2017-09-08 HISTORY — DX: Encounter for other screening for genetic and chromosomal anomalies: Z13.79

## 2017-09-08 HISTORY — DX: Genetic susceptibility to malignant neoplasm of breast: Z15.01

## 2017-09-08 NOTE — Telephone Encounter (Signed)
Revealed that genetic testing found the BRCA2 hereditary mutation that was found in his daughter.  He will come back in on Monday to discuss further.

## 2017-09-11 ENCOUNTER — Ambulatory Visit (HOSPITAL_BASED_OUTPATIENT_CLINIC_OR_DEPARTMENT_OTHER): Payer: BLUE CROSS/BLUE SHIELD | Admitting: Genetic Counselor

## 2017-09-11 DIAGNOSIS — Z1509 Genetic susceptibility to other malignant neoplasm: Secondary | ICD-10-CM

## 2017-09-11 DIAGNOSIS — C61 Malignant neoplasm of prostate: Secondary | ICD-10-CM

## 2017-09-11 DIAGNOSIS — Z803 Family history of malignant neoplasm of breast: Secondary | ICD-10-CM

## 2017-09-11 DIAGNOSIS — Z800 Family history of malignant neoplasm of digestive organs: Secondary | ICD-10-CM | POA: Diagnosis not present

## 2017-09-11 DIAGNOSIS — Z315 Encounter for genetic counseling: Secondary | ICD-10-CM | POA: Diagnosis not present

## 2017-09-11 DIAGNOSIS — Z1379 Encounter for other screening for genetic and chromosomal anomalies: Secondary | ICD-10-CM

## 2017-09-11 DIAGNOSIS — Z1501 Genetic susceptibility to malignant neoplasm of breast: Secondary | ICD-10-CM

## 2017-09-11 NOTE — Progress Notes (Signed)
GENETIC TEST RESULTS   Patient Name: Adam Cruz Patient Age: 56 y.o. Encounter Date: 09/11/2017  Referring Provider: Zola Button, MD    Adam Cruz was seen in the [place] clinic on September 11, 2017 due to a personal and family history of cancer and concern regarding a hereditary predisposition to cancer in the family. Please refer to the prior Genetics clinic note for more information regarding Adam Cruz medical and family histories and our assessment at the time.   FAMILY HISTORY:  We obtained a detailed, 4-generation family history.  Significant diagnoses are listed below: Family History  Problem Relation Age of Onset  . Breast cancer Mother 19  . Pancreatic cancer Mother 68  . Breast cancer Maternal Grandfather        dx in his 27s  . Breast cancer Daughter 46       reportedly BRCA pos  . Cirrhosis Father 70       non alcoholic cirrhosis    The patient has a son and daughter. His daughter developed breast cancer at 27 and was reportedly diagnosed with a BRCA mutation.  He has one brother who has two boys and a girl.  Both parents are deceased.  The patient's mother developed breast cancer at 36 and pancreatic cancer at 23.  She has a brother and sister, who are cancer free.  Their children are also cancer free.  Both maternal grandparents are deceased.  The grandmother died at 58 and grandfather died from breast cancer at 63.  He had several siblings who had cancer.  The patient's father had seven sisters and a brother.  None had cancer.  The paternal grandparents are both deceased from old age.  Patient's maternal ancestors are of Caucasian descent, and paternal ancestors are of Caucasian descent. There is no reported Ashkenazi Jewish ancestry. There is no known consanguinity.  GENETIC TESTING:  At the time of Adam Cruz visit, we recommended he pursue genetic testing of the Common Hereditary cancer test. The genetic testing reported on September 08, 2017 through the Common Hereditary Cancer Panel offered by [facility] identified a single, heterozygous pathogenic gene mutation called BRCA2, [id number] (Splice acceptor). There were no deleterious mutations in APC, ATM, AXIN2, BARD1, BMPR1A, BRCA1, BRIP1, CDH1, CDKN2A, CHEK2, DICER1, EPCAM, GREM1, KIT, MEN1, MLH1, MSH2, MSH6, MUTYH, NBN, NF1, PALB2, PDGFRA, PMS2, POLD1, POLE, PTEN, RAD50, RAD51C, RAD51D, SDHA, SDHB, SDHC, SDHD, SMAD4, SMARCA4. STK11, TP53, TSC1, TSC2, and VHL.   Marland Kitchen    MEDICAL MANAGEMENT: Both men and women who have a BRCA mutation have an increased risk for cancer. We focused on the male risk for cancer due to BRCA2 mutations.  For men who harbor BRCA mutations, the general risk for cancer seems to be only slightly increased. As we discussed, BRCA mutations confer a slightly increased risk for breast cancer in men, and an increased risk for prostate cancer. We recommend that your physician perform a clinical breast exam yearly. Alternatively, you can be seen in the [facility]. A referral from a primary care physician to the cancer center will be needed.  Finally, if a breast mass is noticed, Adam Cruz should be sure and have a physician evaluate it. Adam Cruz has a diagnosis of prostate cancer and is being followed by an oncologist for this.  Adam Cruz will follow up with his current oncologist Dr. Zola Button for high risk screening.  We discussed the pancreatic cancer study at [facility].  In the  past this study would offer pancreatic cancer screening through a research protocol for patients who test positive for genes associated with an increased risk for pancreatic cancer (like BRCA2) and have a family history of pancreatic cancer.  Adam Cruz fits this criteria.  The study is no longer under a research protocol, and they now see patients clinically.  I will make a referral to this program for Adam Cruz.  FAMILY MEMBERS: It is important that all  of Adam Cruz relatives (both men and women) know of the presence of this gene mutation. Site-specific genetic testing can sort out who in the family is at risk and who is not.   Adam Cruz children and siblings have a 50% chance to have inherited this mutation. We recommend they have genetic testing for this same mutation, as identifying the presence of this mutation would allow them to also take advantage of risk-reducing measures. Adam Cruz reports that his daughter has a BRCA2 mutation, but that his son does not.  He will contact his brother, as well as his maternal aunt and uncle, about the hereditary mutation.  We discussed that if family members are tested within 49 days of when his test was complete, they can be tested for free.  They may have a charge for the office visit for genetic counseling, however.  SUPPORT AND RESOURCES: If Adam Cruz is interested in BRCA-specific information and support, there are two groups, Facing Our Risk (www.facingourrisk.com) and Bright Pink (www.brightpink.org) which some people have found useful. They provide opportunities to speak with other individuals from high-risk families. To locate genetic counselors in other cities, visit the website of the [employer] (ArtistMovie.se) and Secretary/administrator for a Social worker by zip code.  We encouraged Adam Cruz to remain in contact with [place] on an annual basis so we can update his personal and family histories, and let him know of advances in cancer genetics that may benefit the family. Our contact number was provided. Adam Cruz questions were answered to his satisfaction today, and he knows he is welcome to call anytime with additional questions.   [name] P. [name], [place], [facility] Certified Genetic Counselor [email or web address] phone: [phone]

## 2017-09-15 ENCOUNTER — Telehealth: Payer: Self-pay | Admitting: Genetic Counselor

## 2017-09-15 NOTE — Telephone Encounter (Signed)
Spoke with patient to let him know that I had made the referral to the pancreatic cancer study at [facility].  They should get in contact with him.  Patient voiced understanding and had no further questions.

## 2017-09-18 ENCOUNTER — Telehealth: Payer: Self-pay | Admitting: Medical Oncology

## 2017-09-18 NOTE — Telephone Encounter (Signed)
Adam Cruz called stating he has a cancer policy. He received a letter asking if he could get information regarding why Dr. [name] prescribed Zytiga. I informed his that Dr. Hazeline Junker consult note discusses this. I emailed him a release of information form and asked him to provide me with fax number and I can submit for him. He also asked for a copy of note.

## 2017-09-29 ENCOUNTER — Other Ambulatory Visit: Payer: Self-pay | Admitting: *Deleted

## 2017-09-29 ENCOUNTER — Encounter: Payer: Self-pay | Admitting: *Deleted

## 2017-09-29 DIAGNOSIS — C61 Malignant neoplasm of prostate: Secondary | ICD-10-CM

## 2017-09-29 MED ORDER — ABIRATERONE ACETATE 250 MG PO TABS: 1000.0000 mg | ORAL_TABLET | Freq: Every day | ORAL | 0 refills | Status: DC

## 2017-10-12 ENCOUNTER — Ambulatory Visit (HOSPITAL_BASED_OUTPATIENT_CLINIC_OR_DEPARTMENT_OTHER): Payer: BLUE CROSS/BLUE SHIELD | Admitting: Oncology

## 2017-10-12 ENCOUNTER — Telehealth: Payer: Self-pay

## 2017-10-12 ENCOUNTER — Other Ambulatory Visit (HOSPITAL_BASED_OUTPATIENT_CLINIC_OR_DEPARTMENT_OTHER): Payer: BLUE CROSS/BLUE SHIELD

## 2017-10-12 VITALS — BP 164/92 | HR 69 | Temp 98.6°F | Resp 20 | Ht 71.5 in | Wt 220.7 lb

## 2017-10-12 DIAGNOSIS — E291 Testicular hypofunction: Secondary | ICD-10-CM

## 2017-10-12 DIAGNOSIS — C61 Malignant neoplasm of prostate: Secondary | ICD-10-CM

## 2017-10-12 DIAGNOSIS — C778 Secondary and unspecified malignant neoplasm of lymph nodes of multiple regions: Secondary | ICD-10-CM | POA: Diagnosis not present

## 2017-10-12 DIAGNOSIS — R413 Other amnesia: Secondary | ICD-10-CM | POA: Diagnosis not present

## 2017-10-12 DIAGNOSIS — R7989 Other specified abnormal findings of blood chemistry: Secondary | ICD-10-CM

## 2017-10-12 LAB — CBC WITH DIFFERENTIAL/PLATELET
BASO%: 0.9 % (ref 0.0–2.0)
BASOS ABS: 0.1 10*3/uL (ref 0.0–0.1)
EOS ABS: 0.5 10*3/uL (ref 0.0–0.5)
EOS%: 6.5 % (ref 0.0–7.0)
HCT: 42.9 % (ref 38.4–49.9)
HGB: 14.3 g/dL (ref 13.0–17.1)
LYMPH%: 33.7 % (ref 14.0–49.0)
MCH: 28.1 pg (ref 27.2–33.4)
MCHC: 33.3 g/dL (ref 32.0–36.0)
MCV: 84.3 fL (ref 79.3–98.0)
MONO#: 0.9 10*3/uL (ref 0.1–0.9)
MONO%: 11.2 % (ref 0.0–14.0)
NEUT#: 3.7 10*3/uL (ref 1.5–6.5)
NEUT%: 47.7 % (ref 39.0–75.0)
Platelets: 151 10*3/uL (ref 140–400)
RBC: 5.09 10*6/uL (ref 4.20–5.82)
RDW: 14.6 % (ref 11.0–14.6)
WBC: 7.7 10*3/uL (ref 4.0–10.3)
lymph#: 2.6 10*3/uL (ref 0.9–3.3)

## 2017-10-12 LAB — COMPREHENSIVE METABOLIC PANEL
ALK PHOS: 106 U/L (ref 40–150)
ALT: 326 U/L (ref 0–55)
AST: 211 U/L (ref 5–34)
Albumin: 3.9 g/dL (ref 3.5–5.0)
Anion Gap: 10 mEq/L (ref 3–11)
BUN: 15.6 mg/dL (ref 7.0–26.0)
CALCIUM: 9.9 mg/dL (ref 8.4–10.4)
CHLORIDE: 109 meq/L (ref 98–109)
CO2: 23 mEq/L (ref 22–29)
Creatinine: 1 mg/dL (ref 0.7–1.3)
GLUCOSE: 116 mg/dL (ref 70–140)
POTASSIUM: 3.2 meq/L — AB (ref 3.5–5.1)
SODIUM: 142 meq/L (ref 136–145)
Total Bilirubin: 0.74 mg/dL (ref 0.20–1.20)
Total Protein: 7.1 g/dL (ref 6.4–8.3)

## 2017-10-12 NOTE — Progress Notes (Addendum)
Hematology and Oncology Follow Up Visit  Adam Cruz [id number] 1961-07-05 56 y.o. 10/12/2017 1:57 PM Adam Cruz, Adam Cruz, Adam Leo, MD   Principle Diagnosis: Prostate cancer diagnosed in June 2018. He presented with a PSA of 66 and found to have a Gleason score 4+4 = 8 in 3 cores and Gleason score 4+3 = 7 and 3 other cores. His staging workup revealed bulky pelvic lymphadenopathy and bone scan is negative.   Prior Therapy:  Androgen deprivation therapy started in July 2018.  Current therapy: Androgen deprivation therapy.  He is due for Lupron in December 2018. Zytiga 1000 mg daily started in August 2018.  Interim History: Adam Cruz presents today for a follow-up visit. Since the last visit, he reports no major changes in his health.  He continues to take Zytiga at 1000 mg daily and reports no complications related to it. He denied any nausea, excessive fatigue or vomiting. He is eating well without any decline in his appetite. He does not report any bone pain or pathological fractures.  He does report lower extremity discomfort that has improved over the last 2 days.  He continues to have issues with memory and concentration and at times he reports having word finding difficulties.  He denies any headaches or seizures.  He does not report any headaches, blurry vision, syncope or seizures. He does not report any fevers, chills, sweats or weight loss. He does not report any chest pain, palpitation, orthopnea or leg edema. He does not report any cough, wheezing, hemoptysis or shortness of breath. He does not report any nausea, vomiting or abdominal pain. He does not report any constipation, diarrhea or change in his bowel habits. He does not report any frequency urgency or hesitancy. He has not reported any skeletal complaints. Remaining review of systems unremarkable  Medications: I have reviewed the patient's current medications.  Current Outpatient Prescriptions  Medication Sig  Dispense Refill  . abiraterone Acetate (ZYTIGA) 250 MG tablet Take 4 tablets (1,000 mg total) by mouth daily. Take on an empty stomach 1 hour before or 2 hours after a meal 120 tablet 0  . amitriptyline (ELAVIL) 25 MG tablet     . ASPIRIN 81 PO TK 1 T PO QD  6  . bicalutamide (CASODEX) 50 MG tablet TK 1 T PO  D  3  . Cholecalciferol (VITAMIN D3) 5000 units CAPS TAKE 1 BY MOUTH DAILY    . empagliflozin (JARDIANCE) 10 MG TABS tablet     . Gabapentin Enacarbil (HORIZANT) 600 MG TBCR TK 1 T PO BID    . glimepiride (AMARYL) 4 MG tablet TK 1 T PO QD  2  . GLIPIZIDE PO Take by mouth.    Marland Kitchen HYDROcodone-acetaminophen (NORCO/VICODIN) 5-325 MG tablet Take by mouth.    . Insulin Glargine-Lixisenatide 100-33 UNT-MCG/ML SOPN Inject into the skin.    Marland Kitchen levothyroxine (SYNTHROID, LEVOTHROID) 125 MCG tablet     . lisinopril (PRINIVIL,ZESTRIL) 5 MG tablet TAKE 1 TABLET BY MOUTH DAILY    . metoprolol succinate (TOPROL-XL) 25 MG 24 hr tablet   3  . omeprazole (PRILOSEC) 40 MG capsule TAKE 1 CAPSULE BY MOUTH ONCE DAILY BEFORE A MEAL IN COMBINATION WITH CLARITHROMYCIN    . rosuvastatin (CRESTOR) 40 MG tablet     . silver sulfADIAZINE (SILVADENE) 1 % cream Apply topically.    . ticagrelor (BRILINTA) 90 MG TABS tablet Take by mouth.     No current facility-administered medications for this visit.      Allergies:  No Known Allergies  Past Medical History, Surgical history, Social history, and Family History were reviewed and updated.  Physical Exam: Blood pressure (!) 164/92, pulse 69, temperature 98.6 F (37 C), temperature source Oral, resp. rate 20, height 5' 11.5" (1.816 m), weight 220 lb 11.2 oz (100.1 kg), SpO2 100 %. ECOG: 0 General appearance: Well-appearing gentleman without distress. Head: Normocephalic, without obvious abnormality no oral thrush or ulcers. Neck: no adenopathy Lymph nodes: Cervical, supraclavicular, and axillary nodes normal. Heart:regular rate and rhythm, S1, S2 normal, no murmur,  click, rub or gallop Lung:chest clear, no wheezing, rales, normal symmetric air entry Abdomin: soft, non-tender, without masses or organomegaly no shifting dullness or ascites. EXT:no erythema, induration, or nodules   Lab Results: Lab Results  Component Value Date   WBC 7.7 10/12/2017   HGB 14.3 10/12/2017   HCT 42.9 10/12/2017   MCV 84.3 10/12/2017   PLT 151 10/12/2017     Chemistry      Component Value Date/Time   NA 141 09/05/2017 1510   K 3.5 09/05/2017 1510   CO2 21 (L) 09/05/2017 1510   BUN 13.2 09/05/2017 1510   CREATININE 1.0 09/05/2017 1510      Component Value Date/Time   CALCIUM 9.9 09/05/2017 1510   ALKPHOS 71 09/05/2017 1510   AST 30 09/05/2017 1510   ALT 24 09/05/2017 1510   BILITOT 0.43 09/05/2017 1510       Impression and Plan:  56 year old gentleman with the following issues:  1. Prostate cancer diagnosed in June 2018. He presented with a PSA of 66 and found to have a Gleason score 4+4 = 8 and three quarters and Gleason score 4+3 = 7 and 3 other cores. His staging workup revealed bulky pelvic lymphadenopathy and bone scan is negative.   He was started on androgen deprivation therapy in the form of Lupron in July 2018.  Zytiga 1000 mg daily added to androgen deprivation therapy in August 2018.  He continues to tolerate this therapy without complications.  His PSA continues to be undetectable.  The plan is to continue with the same dose and schedule and we will repeat staging workup in January 2019.  2. Androgen deprivation therapy: This will be continued indefinitely.  He will receive Lupron in December 2018 with the next visit.  3. Genetic counseling: He has clear family history of breast cancer and BRCA mutation. He completed genetic counseling and BRCA testing.  He will also follow-up at [facility] for pancreatic cancer screening study.  4.  Memory issues: Unclear etiology.  He is reporting issues with concentration and word  finding difficulties.  I will refer him to neurology for an evaluation.  5. Follow-up: Will be in 4-5 weeks to follow his progress.  Adam Button, MD 10/25/20181:57 PM   The results of his liver function test and PSA was discussed today over the phone.  His PSA continues to be undetectable.  His AST and ALT are elevated likely related to Zytiga.  I recommend repeating his liver function test in 1 week to ensure no further increase at this time.  His bilirubin is within normal range and I see no need for any dose modification at this time.  However, if his LFTs continue to increase, we will consider dose reduction.  Adam Button MD 10/13/17

## 2017-10-12 NOTE — Telephone Encounter (Signed)
Printed avs and calender for upcoming appointment per 10/25 los. Spoke with [facility].. Concerning referal they will contact patient.

## 2017-10-13 ENCOUNTER — Telehealth: Payer: Self-pay | Admitting: Oncology

## 2017-10-13 LAB — PSA

## 2017-10-13 NOTE — Addendum Note (Signed)
Addended by: Wyatt Portela on: 10/13/2017 09:02 AM   Modules accepted: Orders

## 2017-10-13 NOTE — Telephone Encounter (Signed)
Scheduled lab appt per 10/26 sch message - patient is aware of appt.

## 2017-10-16 ENCOUNTER — Encounter: Payer: Self-pay | Admitting: Neurology

## 2017-10-19 ENCOUNTER — Other Ambulatory Visit (HOSPITAL_BASED_OUTPATIENT_CLINIC_OR_DEPARTMENT_OTHER): Payer: BLUE CROSS/BLUE SHIELD

## 2017-10-19 ENCOUNTER — Telehealth: Payer: Self-pay | Admitting: *Deleted

## 2017-10-19 ENCOUNTER — Other Ambulatory Visit: Payer: Self-pay | Admitting: *Deleted

## 2017-10-19 DIAGNOSIS — R413 Other amnesia: Secondary | ICD-10-CM

## 2017-10-19 DIAGNOSIS — C61 Malignant neoplasm of prostate: Secondary | ICD-10-CM

## 2017-10-19 DIAGNOSIS — C778 Secondary and unspecified malignant neoplasm of lymph nodes of multiple regions: Secondary | ICD-10-CM

## 2017-10-19 LAB — COMPREHENSIVE METABOLIC PANEL
ALBUMIN: 3.7 g/dL (ref 3.5–5.0)
ALK PHOS: 108 U/L (ref 40–150)
ALT: 142 U/L — ABNORMAL HIGH (ref 0–55)
AST: 85 U/L — ABNORMAL HIGH (ref 5–34)
Anion Gap: 9 mEq/L (ref 3–11)
BILIRUBIN TOTAL: 0.77 mg/dL (ref 0.20–1.20)
BUN: 18.4 mg/dL (ref 7.0–26.0)
CALCIUM: 10 mg/dL (ref 8.4–10.4)
CO2: 24 mEq/L (ref 22–29)
Chloride: 109 mEq/L (ref 98–109)
Creatinine: 1 mg/dL (ref 0.7–1.3)
Glucose: 147 mg/dl — ABNORMAL HIGH (ref 70–140)
POTASSIUM: 2.9 meq/L — AB (ref 3.5–5.1)
Sodium: 142 mEq/L (ref 136–145)
TOTAL PROTEIN: 7.1 g/dL (ref 6.4–8.3)

## 2017-10-19 MED ORDER — POTASSIUM CHLORIDE CRYS ER 20 MEQ PO TBCR: 20.0000 meq | EXTENDED_RELEASE_TABLET | Freq: Every day | ORAL | 0 refills | Status: DC

## 2017-10-19 NOTE — Telephone Encounter (Signed)
As noted below by Dr. [name], I informed patient of his lab results. Potassium was 2.9. I escribed a prescription to [facility] in [place], [place]. Patient verbalized understanding. Also, liver is normal. There are no changes to his Zytiga dose. Patient verbalized understanding.

## 2017-10-19 NOTE — Telephone Encounter (Signed)
-----   Message from Wyatt Portela, MD sent at 10/19/2017  3:16 PM EDT ----- Please let him know:  His liver function test are improved. No changes in Zytiga. They will likely be normal next lab visit.   His K is low and need Kdur 30meq daily (#30) for a month.

## 2017-10-25 ENCOUNTER — Other Ambulatory Visit: Payer: Self-pay | Admitting: Oncology

## 2017-10-25 DIAGNOSIS — C61 Malignant neoplasm of prostate: Secondary | ICD-10-CM

## 2017-10-26 DIAGNOSIS — L821 Other seborrheic keratosis: Secondary | ICD-10-CM | POA: Diagnosis not present

## 2017-10-26 DIAGNOSIS — L820 Inflamed seborrheic keratosis: Secondary | ICD-10-CM | POA: Diagnosis not present

## 2017-10-26 DIAGNOSIS — L570 Actinic keratosis: Secondary | ICD-10-CM | POA: Diagnosis not present

## 2017-10-26 DIAGNOSIS — B079 Viral wart, unspecified: Secondary | ICD-10-CM | POA: Diagnosis not present

## 2017-10-26 DIAGNOSIS — L918 Other hypertrophic disorders of the skin: Secondary | ICD-10-CM | POA: Diagnosis not present

## 2017-10-30 DIAGNOSIS — Z1501 Genetic susceptibility to malignant neoplasm of breast: Secondary | ICD-10-CM | POA: Diagnosis not present

## 2017-10-30 DIAGNOSIS — Z1503 Genetic susceptibility to malignant neoplasm of prostate: Secondary | ICD-10-CM | POA: Diagnosis not present

## 2017-10-30 DIAGNOSIS — Z1509 Genetic susceptibility to other malignant neoplasm: Secondary | ICD-10-CM | POA: Diagnosis not present

## 2017-10-30 DIAGNOSIS — Z1289 Encounter for screening for malignant neoplasm of other sites: Secondary | ICD-10-CM | POA: Diagnosis not present

## 2017-10-30 DIAGNOSIS — Z800 Family history of malignant neoplasm of digestive organs: Secondary | ICD-10-CM | POA: Diagnosis not present

## 2017-11-21 ENCOUNTER — Ambulatory Visit (HOSPITAL_BASED_OUTPATIENT_CLINIC_OR_DEPARTMENT_OTHER): Payer: BLUE CROSS/BLUE SHIELD

## 2017-11-21 ENCOUNTER — Ambulatory Visit (HOSPITAL_BASED_OUTPATIENT_CLINIC_OR_DEPARTMENT_OTHER): Payer: BLUE CROSS/BLUE SHIELD | Admitting: Oncology

## 2017-11-21 ENCOUNTER — Telehealth: Payer: Self-pay | Admitting: Oncology

## 2017-11-21 ENCOUNTER — Other Ambulatory Visit (HOSPITAL_BASED_OUTPATIENT_CLINIC_OR_DEPARTMENT_OTHER): Payer: BLUE CROSS/BLUE SHIELD

## 2017-11-21 ENCOUNTER — Ambulatory Visit: Payer: BLUE CROSS/BLUE SHIELD

## 2017-11-21 VITALS — BP 171/86 | HR 82 | Temp 98.4°F | Resp 18 | Ht 71.5 in | Wt 216.2 lb

## 2017-11-21 DIAGNOSIS — C61 Malignant neoplasm of prostate: Secondary | ICD-10-CM

## 2017-11-21 DIAGNOSIS — C778 Secondary and unspecified malignant neoplasm of lymph nodes of multiple regions: Secondary | ICD-10-CM

## 2017-11-21 DIAGNOSIS — E291 Testicular hypofunction: Secondary | ICD-10-CM

## 2017-11-21 DIAGNOSIS — Z5111 Encounter for antineoplastic chemotherapy: Secondary | ICD-10-CM | POA: Diagnosis not present

## 2017-11-21 DIAGNOSIS — R05 Cough: Secondary | ICD-10-CM

## 2017-11-21 DIAGNOSIS — R413 Other amnesia: Secondary | ICD-10-CM

## 2017-11-21 LAB — CBC WITH DIFFERENTIAL/PLATELET
BASO%: 1 % (ref 0.0–2.0)
BASOS ABS: 0.1 10*3/uL (ref 0.0–0.1)
EOS ABS: 0.4 10*3/uL (ref 0.0–0.5)
EOS%: 4.6 % (ref 0.0–7.0)
HCT: 42 % (ref 38.4–49.9)
HGB: 14 g/dL (ref 13.0–17.1)
LYMPH%: 25.4 % (ref 14.0–49.0)
MCH: 28.2 pg (ref 27.2–33.4)
MCHC: 33.4 g/dL (ref 32.0–36.0)
MCV: 84.3 fL (ref 79.3–98.0)
MONO#: 0.7 10*3/uL (ref 0.1–0.9)
MONO%: 8.6 % (ref 0.0–14.0)
NEUT#: 5.1 10*3/uL (ref 1.5–6.5)
NEUT%: 60.4 % (ref 39.0–75.0)
Platelets: 148 10*3/uL (ref 140–400)
RBC: 4.98 10*6/uL (ref 4.20–5.82)
RDW: 14.9 % — ABNORMAL HIGH (ref 11.0–14.6)
WBC: 8.5 10*3/uL (ref 4.0–10.3)
lymph#: 2.1 10*3/uL (ref 0.9–3.3)

## 2017-11-21 LAB — COMPREHENSIVE METABOLIC PANEL
ALT: 32 U/L (ref 0–55)
AST: 29 U/L (ref 5–34)
Albumin: 3.5 g/dL (ref 3.5–5.0)
Alkaline Phosphatase: 119 U/L (ref 40–150)
Anion Gap: 8 mEq/L (ref 3–11)
BUN: 14.2 mg/dL (ref 7.0–26.0)
CHLORIDE: 106 meq/L (ref 98–109)
CO2: 25 meq/L (ref 22–29)
CREATININE: 1.1 mg/dL (ref 0.7–1.3)
Calcium: 9.4 mg/dL (ref 8.4–10.4)
EGFR: >60 mL/min/{1.73_m2} (ref >60)
Glucose: 238 mg/dl — ABNORMAL HIGH (ref 70–140)
Potassium: 3.6 mEq/L (ref 3.5–5.1)
SODIUM: 140 meq/L (ref 136–145)
Total Bilirubin: 0.4 mg/dL (ref 0.20–1.20)
Total Protein: 6.9 g/dL (ref 6.4–8.3)

## 2017-11-21 MED ORDER — LEUPROLIDE ACETATE (4 MONTH) 30 MG IM KIT
30.0000 mg | PACK | Freq: Once | INTRAMUSCULAR | Status: AC
  Administered 2017-11-21: 30 mg via INTRAMUSCULAR
  Filled 2017-11-21: qty 30

## 2017-11-21 NOTE — Telephone Encounter (Signed)
Gave avs and calendar for January 2019 °

## 2017-11-21 NOTE — Progress Notes (Signed)
Hematology and Oncology Follow Up Visit  Adam Cruz [id number] 05-07-1961 56 y.o. 11/21/2017 3:10 PM Adam Cruz, MDSaeed, Adam Leo, MD   Principle Diagnosis: Prostate cancer diagnosed in June 2018. He presented with a PSA of 66 and found to have a Gleason score 4+4 = 8 in 3 cores and Gleason score 4+3 = 7 and 3 other cores. His staging workup revealed bulky pelvic lymphadenopathy and bone scan is negative.   Prior Therapy:  Androgen deprivation therapy started in July 2018.  Current therapy: Androgen deprivation therapy.  He is due for Lupron in December 2018. Zytiga 1000 mg daily started in August 2018.  Interim History: [name] presents today for a follow-up visit. Since the last visit, he reports developing upper respiratory tract infection.  He is reporting some symptoms of congestion and nonproductive cough.  He denies any fevers or shortness of breath.  He continues to take Zytiga at 1000 mg daily and reports no complications related to it. He denied any nausea, excessive fatigue or vomiting. He is eating well and his weight is stable.  He does not report any bone pain or pathological fractures.  He does not report any headaches, blurry vision, syncope or seizures. He does not report any fevers, chills, sweats or weight loss. He does not report any chest pain, palpitation, orthopnea or leg edema. He does not report any cough, wheezing, hemoptysis or shortness of breath. He does not report any nausea, vomiting or abdominal pain. He does not report any constipation, diarrhea or change in his bowel habits. He does not report any frequency urgency or hesitancy. He has not reported any skeletal complaints. Remaining review of systems unremarkable  Medications: I have reviewed the patient's current medications.  Current Outpatient Medications  Medication Sig Dispense Refill  . amitriptyline (ELAVIL) 25 MG tablet     . ASPIRIN 81 PO TK 1 T PO QD  6  . bicalutamide (CASODEX) 50 MG  tablet TK 1 T PO  D  3  . Cholecalciferol (VITAMIN D3) 5000 units CAPS TAKE 1 BY MOUTH DAILY    . empagliflozin (JARDIANCE) 10 MG TABS tablet     . Gabapentin Enacarbil (HORIZANT) 600 MG TBCR TK 1 T PO BID    . glimepiride (AMARYL) 4 MG tablet TK 1 T PO QD  2  . GLIPIZIDE PO Take by mouth.    Marland Kitchen HYDROcodone-acetaminophen (NORCO/VICODIN) 5-325 MG tablet Take by mouth.    . Insulin Glargine-Lixisenatide 100-33 UNT-MCG/ML SOPN Inject into the skin.    Marland Kitchen levothyroxine (SYNTHROID, LEVOTHROID) 125 MCG tablet     . lisinopril (PRINIVIL,ZESTRIL) 5 MG tablet TAKE 1 TABLET BY MOUTH DAILY    . metoprolol succinate (TOPROL-XL) 25 MG 24 hr tablet   3  . omeprazole (PRILOSEC) 40 MG capsule TAKE 1 CAPSULE BY MOUTH ONCE DAILY BEFORE A MEAL IN COMBINATION WITH CLARITHROMYCIN    . potassium chloride SA (K-DUR,KLOR-CON) 20 MEQ tablet Take 1 tablet (20 mEq total) by mouth daily. 30 tablet 0  . rosuvastatin (CRESTOR) 40 MG tablet     . silver sulfADIAZINE (SILVADENE) 1 % cream Apply topically.    . ticagrelor (BRILINTA) 90 MG TABS tablet Take by mouth.    Marland Kitchen ZYTIGA 250 MG tablet TAKE 4 TABLETS BY MOUTH DAILY. TAKE ON AN EMPTY STOMACH 1 HOUR BEFORE OR 2 HOURS AFTER A MEAL 120 tablet 1   No current facility-administered medications for this visit.      Allergies: No Known Allergies  Past Medical  History, Surgical history, Social history, and Family History were reviewed and updated.  Physical Exam: Blood pressure (!) 171/86, pulse 82, temperature 98.4 F (36.9 C), temperature source Oral, resp. rate 18, height 5' 11.5" (1.816 m), weight 216 lb 3.2 oz (98.1 kg), SpO2 98 %. ECOG: 0 General appearance: Alert, awake gentleman without distress. Head: Normocephalic, without obvious abnormality no oral ulcers or thrush. Neck: no adenopathy Lymph nodes: Cervical, supraclavicular, and axillary nodes normal. Heart:regular rate and rhythm, S1, S2 normal, no murmur, click, rub or gallop Lung:chest clear, no wheezing,  rales, normal symmetric air entry Abdomin: soft, non-tender, without masses or organomegaly no rebound or guarding. EXT:no erythema, induration, or nodules   Lab Results: Lab Results  Component Value Date   WBC 8.5 11/21/2017   HGB 14.0 11/21/2017   HCT 42.0 11/21/2017   MCV 84.3 11/21/2017   PLT 148 11/21/2017     Chemistry      Component Value Date/Time   NA 142 10/19/2017 1415   K 2.9 (LL) 10/19/2017 1415   CO2 24 10/19/2017 1415   BUN 18.4 10/19/2017 1415   CREATININE 1.0 10/19/2017 1415      Component Value Date/Time   CALCIUM 10.0 10/19/2017 1415   ALKPHOS 108 10/19/2017 1415   AST 85 (H) 10/19/2017 1415   ALT 142 (H) 10/19/2017 1415   BILITOT 0.77 10/19/2017 1415      Results for Adam Cruz (MRN [id number]) as of 11/21/2017 14:40  Ref. Range 09/05/2017 15:10 10/12/2017 13:16  Prostate Specific Ag, Serum Latest Ref Range: 0.0 - 4.0 ng/mL <0.1 <0.1   Impression and Plan:  56 year old gentleman with the following issues:  1. Prostate cancer diagnosed in June 2018. He presented with a PSA of 66 and found to have a Gleason score 4+4 = 8 and three quarters and Gleason score 4+3 = 7 and 3 other cores. His staging workup revealed bulky pelvic lymphadenopathy and bone scan is negative.   He was started on androgen deprivation therapy in the form of Lupron in July 2018.  Zytiga 1000 mg daily added to androgen deprivation therapy in August 2018.   His PSA continues to show excellent response and currently undetectable.  Plan is to continue with the same dose and schedule and plan to repeat imaging studies for staging purposes and 2018/03/11.  2. Androgen deprivation therapy: This will be continued indefinitely.  He will receive Lupron in December 2018 be repeated every 4 months.  3. Genetic counseling: He has clear family history of breast cancer and BRCA mutation. He completed genetic counseling and BRCA testing.    4.  Memory issues: He has an evaluation  by neurology upcoming.  5.  Elevated liver function tests: Related to Zytiga and will continue to be monitored moving forward.  I see no need for dose reduction at this time.  6. Follow-up: Will be in 4-5 weeks to follow his progress.  Zola Button, MD 12/4/20183:10 PM

## 2017-11-21 NOTE — Patient Instructions (Signed)
Leuprolide depot injection What is this medicine? LEUPROLIDE (loo PROE lide) is a man-made protein that acts like a natural hormone in the body. It decreases testosterone in men and decreases estrogen in women. In men, this medicine is used to treat advanced prostate cancer. In women, some forms of this medicine may be used to treat endometriosis, uterine fibroids, or other male hormone-related problems. This medicine may be used for other purposes; ask your health care provider or pharmacist if you have questions. COMMON BRAND NAME(S): Eligard, Lupron Depot, Lupron Depot-Ped, Viadur What should I tell my health care provider before I take this medicine? They need to know if you have any of these conditions: -diabetes -heart disease or previous heart attack -high blood pressure -high cholesterol -mental illness -osteoporosis -pain or difficulty passing urine -seizures -spinal cord metastasis -stroke -suicidal thoughts, plans, or attempt; a previous suicide attempt by you or a family member -tobacco smoker -unusual vaginal bleeding (women) -an unusual or allergic reaction to leuprolide, benzyl alcohol, other medicines, foods, dyes, or preservatives -pregnant or trying to get pregnant -breast-feeding How should I use this medicine? This medicine is for injection into a muscle or for injection under the skin. It is given by a health care professional in a hospital or clinic setting. The specific product will determine how it will be given to you. Make sure you understand which product you receive and how often you will receive it. Talk to your pediatrician regarding the use of this medicine in children. Special care may be needed. Overdosage: If you think you have taken too much of this medicine contact a poison control center or emergency room at once. NOTE: This medicine is only for you. Do not share this medicine with others. What if I miss a dose? It is important not to miss a dose.  Call your doctor or health care professional if you are unable to keep an appointment. Depot injections: Depot injections are given either once-monthly, every 12 weeks, every 16 weeks, or every 24 weeks depending on the product you are prescribed. The product you are prescribed will be based on if you are male or male, and your condition. Make sure you understand your product and dosing. What may interact with this medicine? Do not take this medicine with any of the following medications: -chasteberry This medicine may also interact with the following medications: -herbal or dietary supplements, like black cohosh or DHEA -male hormones, like estrogens or progestins and birth control pills, patches, rings, or injections -male hormones, like testosterone This list may not describe all possible interactions. Give your health care provider a list of all the medicines, herbs, non-prescription drugs, or dietary supplements you use. Also tell them if you smoke, drink alcohol, or use illegal drugs. Some items may interact with your medicine. What should I watch for while using this medicine? Visit your doctor or health care professional for regular checks on your progress. During the first weeks of treatment, your symptoms may get worse, but then will improve as you continue your treatment. You may get hot flashes, increased bone pain, increased difficulty passing urine, or an aggravation of nerve symptoms. Discuss these effects with your doctor or health care professional, some of them may improve with continued use of this medicine. Male patients may experience a menstrual cycle or spotting during the first months of therapy with this medicine. If this continues, contact your doctor or health care professional. What side effects may I notice from receiving this medicine? Side   effects that you should report to your doctor or health care professional as soon as possible: -allergic reactions like skin  rash, itching or hives, swelling of the face, lips, or tongue -breathing problems -chest pain -depression or memory disorders -pain in your legs or groin -pain at site where injected or implanted -seizures -severe headache -swelling of the feet and legs -suicidal thoughts or other mood changes -visual changes -vomiting Side effects that usually do not require medical attention (report to your doctor or health care professional if they continue or are bothersome): -breast swelling or tenderness -decrease in sex drive or performance -diarrhea -hot flashes -loss of appetite -muscle, joint, or bone pains -nausea -redness or irritation at site where injected or implanted -skin problems or acne This list may not describe all possible side effects. Call your doctor for medical advice about side effects. You may report side effects to FDA at 1-800-FDA-1088. Where should I keep my medicine? This drug is given in a hospital or clinic and will not be stored at home. NOTE: This sheet is a summary. It may not cover all possible information. If you have questions about this medicine, talk to your doctor, pharmacist, or health care provider.  2018 Elsevier/Gold Standard (2016-05-19 09:45:53)  

## 2017-11-22 ENCOUNTER — Telehealth: Payer: Self-pay | Admitting: *Deleted

## 2017-11-22 LAB — PSA

## 2017-11-22 NOTE — Telephone Encounter (Signed)
-----   Message from Wyatt Portela, MD sent at 11/22/2017  8:05 AM EST ----- Please let him know his PSA is low. His LFTs are normal too

## 2017-11-22 NOTE — Telephone Encounter (Signed)
Gave patient results of last PSA and lft's. Patient requested AVS from yesterday to give to his employer. Printed AVS and mailed to patient's home.

## 2017-11-24 DIAGNOSIS — I10 Essential (primary) hypertension: Secondary | ICD-10-CM | POA: Diagnosis not present

## 2017-11-24 DIAGNOSIS — J329 Chronic sinusitis, unspecified: Secondary | ICD-10-CM | POA: Diagnosis not present

## 2017-11-29 ENCOUNTER — Ambulatory Visit (INDEPENDENT_AMBULATORY_CARE_PROVIDER_SITE_OTHER): Payer: BLUE CROSS/BLUE SHIELD | Admitting: Neurology

## 2017-11-29 ENCOUNTER — Other Ambulatory Visit: Payer: BLUE CROSS/BLUE SHIELD

## 2017-11-29 ENCOUNTER — Encounter: Payer: Self-pay | Admitting: Neurology

## 2017-11-29 VITALS — BP 140/82 | HR 82 | Ht 72.0 in | Wt 213.0 lb

## 2017-11-29 DIAGNOSIS — R413 Other amnesia: Secondary | ICD-10-CM | POA: Diagnosis not present

## 2017-11-29 DIAGNOSIS — C61 Malignant neoplasm of prostate: Secondary | ICD-10-CM

## 2017-11-29 DIAGNOSIS — G629 Polyneuropathy, unspecified: Secondary | ICD-10-CM

## 2017-11-29 DIAGNOSIS — R2681 Unsteadiness on feet: Secondary | ICD-10-CM
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
## 2017-11-29 NOTE — Patient Instructions (Addendum)
1. Schedule MRI brain with and without contrast  We have sent a referral to [facility] for your MRI and they will call you directly to schedule your appt. They are located at [place]. If you need to contact them directly please call [phone].   2. Schedule EMG/NCV of both LE 3. Bloodwork for TSH, B12, SPEP/IFE, ESR, CRP  Your provider has requested that you have labwork completed today. Please go to [facility] ([id number]) on the second floor of this building before leaving the office today. You do not need to check in. If you are not called within 15 minutes please check with the front desk.   4. Schedule Neurocognitive testing with Dr. Si Raider 5. Refer to PT for Balance therapy (at [facility]) 6. Follow-up after tests  RECOMMENDATIONS FOR ALL PATIENTS WITH MEMORY PROBLEMS: 1. Continue to exercise (Recommend 30 minutes of walking everyday, or 3 hours every week) 2. Increase social interactions - continue going to [place] and enjoy social gatherings with friends and family 3. Eat healthy, avoid fried foods and eat more fruits and vegetables 4. Maintain adequate blood pressure, blood sugar, and blood cholesterol level. Reducing the risk of stroke and cardiovascular disease also helps promoting better memory. 5. Avoid stressful situations. Live a simple life and avoid aggravations. Organize your time and prepare for the next day in anticipation. 6. Sleep well, avoid any interruptions of sleep and avoid any distractions in the bedroom that may interfere with adequate sleep quality 7. Avoid sugar, avoid sweets as there is a strong link between excessive sugar intake, diabetes, and cognitive impairment We discussed the Mediterranean diet, which has been shown to help patients reduce the risk of progressive memory disorders and reduces cardiovascular risk. This includes eating fish, eat fruits and green leafy vegetables, nuts like almonds and hazelnuts, walnuts,  and also use olive oil. Avoid fast foods and fried foods as much as possible. Avoid sweets and sugar as sugar use has been linked to worsening of memory function.

## 2017-12-01 DIAGNOSIS — E785 Hyperlipidemia, unspecified: Secondary | ICD-10-CM | POA: Diagnosis not present

## 2017-12-01 DIAGNOSIS — I10 Essential (primary) hypertension: Secondary | ICD-10-CM | POA: Diagnosis not present

## 2017-12-01 DIAGNOSIS — Z23 Encounter for immunization: Secondary | ICD-10-CM | POA: Diagnosis not present

## 2017-12-01 DIAGNOSIS — E1140 Type 2 diabetes mellitus with diabetic neuropathy, unspecified: Secondary | ICD-10-CM | POA: Diagnosis not present

## 2017-12-01 LAB — PROTEIN ELECTROPHORESIS, SERUM
ALPHA 2: 0.9 g/dL (ref 0.5–0.9)
Albumin ELP: 3.6 g/dL — ABNORMAL LOW (ref 3.8–4.8)
Alpha 1: 0.3 g/dL (ref 0.2–0.3)
BETA GLOBULIN: 0.4 g/dL (ref 0.4–0.6)
Beta 2: 0.4 g/dL (ref 0.2–0.5)
GAMMA GLOBULIN: 0.7 g/dL — AB (ref 0.8–1.7)
TOTAL PROTEIN: 6.3 g/dL (ref 6.1–8.1)

## 2017-12-01 LAB — C-REACTIVE PROTEIN: CRP: 2.2 mg/L (ref <8.0)

## 2017-12-01 LAB — SEDIMENTATION RATE: Sed Rate: 31 mm/h — ABNORMAL HIGH (ref 0–20)

## 2017-12-01 LAB — IMMUNOFIXATION ELECTROPHORESIS
IGG (IMMUNOGLOBIN G), SERUM: 814 mg/dL (ref 694–1618)
IGM, SERUM: 47 mg/dL — AB (ref 48–271)
Immunofix Electr Int: NOT DETECTED
Immunoglobulin A: 146 mg/dL (ref 81–463)

## 2017-12-01 LAB — VITAMIN B12: Vitamin B-12: 664 pg/mL (ref 200–1100)

## 2017-12-01 LAB — TSH: TSH: 1.8 mIU/L (ref 0.40–4.50)

## 2017-12-07 DIAGNOSIS — L570 Actinic keratosis: Secondary | ICD-10-CM | POA: Diagnosis not present

## 2017-12-07 DIAGNOSIS — L918 Other hypertrophic disorders of the skin: Secondary | ICD-10-CM | POA: Diagnosis not present

## 2017-12-07 DIAGNOSIS — L820 Inflamed seborrheic keratosis: Secondary | ICD-10-CM | POA: Diagnosis not present

## 2017-12-09 ENCOUNTER — Other Ambulatory Visit: Payer: BLUE CROSS/BLUE SHIELD

## 2017-12-10 ENCOUNTER — Ambulatory Visit
Admission: RE | Admit: 2017-12-10 | Discharge: 2017-12-10 | Disposition: A | Discharge status: Home / Self Care | Payer: BLUE CROSS/BLUE SHIELD | Source: Ambulatory Visit | Attending: Neurology | Admitting: Neurology

## 2017-12-10 DIAGNOSIS — R413 Other amnesia: Secondary | ICD-10-CM | POA: Diagnosis not present

## 2017-12-14 ENCOUNTER — Telehealth: Payer: Self-pay

## 2017-12-14 NOTE — Telephone Encounter (Signed)
-----   Message from Cameron Sprang, MD sent at 12/11/2017 10:15 AM EST ----- Pls let him know MRI brain normal, no evidence of tumor, stroke, or bleed. Thanks

## 2017-12-14 NOTE — Telephone Encounter (Signed)
LMOM relaying message below.  

## 2017-12-21 ENCOUNTER — Other Ambulatory Visit: Payer: Self-pay | Admitting: Oncology

## 2017-12-21 DIAGNOSIS — C61 Malignant neoplasm of prostate: Secondary | ICD-10-CM

## 2017-12-26 ENCOUNTER — Inpatient Hospital Stay: Payer: BLUE CROSS/BLUE SHIELD | Attending: Radiation Oncology | Admitting: Oncology

## 2017-12-26 ENCOUNTER — Inpatient Hospital Stay: Payer: BLUE CROSS/BLUE SHIELD | Attending: Oncology

## 2017-12-26 ENCOUNTER — Telehealth: Payer: Self-pay | Admitting: Oncology

## 2017-12-26 VITALS — BP 148/91 | HR 90 | Temp 98.5°F | Resp 18 | Ht 72.0 in | Wt 212.7 lb

## 2017-12-26 DIAGNOSIS — C61 Malignant neoplasm of prostate: Secondary | ICD-10-CM | POA: Insufficient documentation

## 2017-12-26 DIAGNOSIS — E291 Testicular hypofunction: Secondary | ICD-10-CM

## 2017-12-26 DIAGNOSIS — R748 Abnormal levels of other serum enzymes: Secondary | ICD-10-CM | POA: Insufficient documentation

## 2017-12-26 DIAGNOSIS — G629 Polyneuropathy, unspecified: Secondary | ICD-10-CM | POA: Insufficient documentation

## 2017-12-26 DIAGNOSIS — R413 Other amnesia: Secondary | ICD-10-CM | POA: Insufficient documentation

## 2017-12-26 DIAGNOSIS — Z803 Family history of malignant neoplasm of breast: Secondary | ICD-10-CM | POA: Insufficient documentation

## 2017-12-26 DIAGNOSIS — M6281 Muscle weakness (generalized): Secondary | ICD-10-CM | POA: Insufficient documentation

## 2017-12-26 DIAGNOSIS — E876 Hypokalemia: Secondary | ICD-10-CM | POA: Diagnosis not present

## 2017-12-26 LAB — CBC WITH DIFFERENTIAL/PLATELET
Abs Granulocyte: 3.9 10*3/uL (ref 1.5–6.5)
BASOS ABS: 0.1 10*3/uL (ref 0.0–0.1)
Basophils Relative: 1 %
EOS ABS: 0.3 10*3/uL (ref 0.0–0.5)
EOS PCT: 5 %
HEMATOCRIT: 41 % (ref 38.4–49.9)
HEMOGLOBIN: 13.5 g/dL (ref 13.0–17.1)
Lymphocytes Relative: 32 %
Lymphs Abs: 2.3 10*3/uL (ref 0.9–3.3)
MCH: 27.5 pg (ref 27.2–33.4)
MCHC: 33 g/dL (ref 32.0–36.0)
MCV: 83.4 fL (ref 79.3–98.0)
Monocytes Absolute: 0.6 10*3/uL (ref 0.1–0.9)
Monocytes Relative: 8 %
NEUTROS PCT: 54 %
Neutro Abs: 3.9 10*3/uL (ref 1.5–6.5)
Platelets: 143 10*3/uL (ref 140–400)
RBC: 4.91 MIL/uL (ref 4.20–5.82)
RDW: 14.8 % (ref 11.0–15.6)
WBC: 7.2 10*3/uL (ref 4.0–10.3)

## 2017-12-26 LAB — COMPREHENSIVE METABOLIC PANEL
ALT: 42 U/L (ref 0–55)
AST: 35 U/L — ABNORMAL HIGH (ref 5–34)
Albumin: 3.6 g/dL (ref 3.5–5.0)
Alkaline Phosphatase: 121 U/L (ref 40–150)
Anion gap: 10 (ref 3–11)
BUN: 19 mg/dL (ref 7–26)
CALCIUM: 9.8 mg/dL (ref 8.4–10.4)
CHLORIDE: 106 mmol/L (ref 98–109)
CO2: 23 mmol/L (ref 22–29)
CREATININE: 1.15 mg/dL (ref 0.70–1.30)
GFR calc non Af Amer: >60 mL/min (ref >60)
Glucose, Bld: 376 mg/dL — ABNORMAL HIGH (ref 70–140)
Potassium: 3.6 mmol/L (ref 3.5–5.1)
Sodium: 139 mmol/L (ref 136–145)
Total Bilirubin: 0.4 mg/dL (ref 0.2–1.2)
Total Protein: 6.8 g/dL (ref 6.4–8.3)

## 2017-12-26 NOTE — Telephone Encounter (Signed)
Scheduled appt per 1/8 los - Gave patient AVS and calender per los. Lab and f/u in 4 wks.

## 2017-12-26 NOTE — Progress Notes (Signed)
Hematology and Oncology Follow Up Visit  Adam Cruz [id number] [birth year] 57 y.o. 12/26/2017 4:25 PM Adam Cruz, MDSaeed, Adam Leo, MD   Principle Diagnosis: 57 year old gentleman with prostate cancer diagnosed in June 2018. He presented with a PSA of 66 and found to have a Gleason score 4+4 = 8 in 3 cores and Gleason score 4+3 = 7 and 3 other cores.  He was found to have pelvic lymphadenopathy bone disease.  Prior Therapy:  Androgen deprivation therapy started in July 2018.  Current therapy: Lupron 30 mg every 4 months given on November 21, 2017.   Zytiga 1000 mg daily started in August 2018.  Interim History: Adam Cruz here for a follow-up visit.  He reports few issues since the last visit.  He reports lower extremity weakness from the knee down.  Reports his strength has decreased bilaterally and feels it with his mobility.  This weakness is affected his ambulation and stability and currently uses a cane.  He denies any falls or syncope.  He denied any muscle pain or joint swelling.  He does have neuropathy in his lower extremities but has been chronic and not dramatically changed.  His weakness has been rather constant and not relieved or exacerbated by any symptoms.  He continues to have issues related to memory and reflexes.  His wife also reports some periodic hallucinations.  He was evaluated by neurology and MRI of the brain showed no evidence of malignancy or abnormalities.  He denied any new neurological deficits such as upper extremity weakness, seizure activity or altered mental status.  He continues to take Zytiga at 1000 mg daily . He denied any worsening edema, nausea, excessive fatigue or vomiting. He is eating well and his weight is unchanged. He does not report any bone pain or pathological fractures.  He does not report any headaches, blurry vision, syncope.  He denies any other neurological deficits.  He does not report any fevers, chills, sweats or weight loss. He  does not report any chest pain, palpitation, orthopnea or leg edema. He does not report any cough, wheezing, hemoptysis or shortness of breath. He does not report any nausea, vomiting or abdominal pain. He does not report any constipation, diarrhea or change in his bowel habits. He does not report any frequency urgency or hesitancy. He has not reported any skeletal complaints.  He denied any lymphadenopathy, petechiae or bruises.  He denies any mood issues such as anxiety or depression.  He denies any muscle pain or swelling.  Remaining review of systems is negative.  Medications: I have reviewed the patient's current medications.  Current Outpatient Medications  Medication Sig Dispense Refill  . amitriptyline (ELAVIL) 25 MG tablet     . ASPIRIN 81 PO TK 1 T PO QD  6  . bicalutamide (CASODEX) 50 MG tablet TK 1 T PO  D  3  . Cholecalciferol (VITAMIN D3) 5000 units CAPS TAKE 1 BY MOUTH DAILY    . empagliflozin (JARDIANCE) 10 MG TABS tablet     . Gabapentin Enacarbil (HORIZANT) 600 MG TBCR TK 1 T PO BID    . glimepiride (AMARYL) 4 MG tablet TK 1 T PO QD  2  . GLIPIZIDE PO Take by mouth.    Marland Kitchen HYDROcodone-acetaminophen (NORCO/VICODIN) 5-325 MG tablet Take by mouth.    . Insulin Glargine-Lixisenatide 100-33 UNT-MCG/ML SOPN Inject into the skin.    Marland Kitchen levothyroxine (SYNTHROID, LEVOTHROID) 125 MCG tablet     . lisinopril (PRINIVIL,ZESTRIL) 5 MG tablet TAKE 1  TABLET BY MOUTH DAILY    . metoprolol succinate (TOPROL-XL) 25 MG 24 hr tablet   3  . omeprazole (PRILOSEC) 40 MG capsule TAKE 1 CAPSULE BY MOUTH ONCE DAILY BEFORE A MEAL IN COMBINATION WITH CLARITHROMYCIN    . potassium chloride SA (K-DUR,KLOR-CON) 20 MEQ tablet Take 1 tablet (20 mEq total) by mouth daily. 30 tablet 0  . rosuvastatin (CRESTOR) 40 MG tablet     . silver sulfADIAZINE (SILVADENE) 1 % cream Apply topically.    . ticagrelor (BRILINTA) 90 MG TABS tablet Take by mouth.    Marland Kitchen ZYTIGA 250 MG tablet TAKE 4 TABLETS BY MOUTH DAILY . TAKE ON AN  EMPTY STOMACH 1 HOUR BEFORE OR 2 HOURS AFTER A MEAL 120 tablet 0   No current facility-administered medications for this visit.      Allergies: No Known Allergies  Past Medical History, Surgical history, Social history, and Family History are unchanged.  He denies any home or tobacco use.  Physical Exam: Blood pressure (!) 148/91, pulse 90, temperature 98.5 F (36.9 C), temperature source Oral, resp. rate 18, height 6' (1.829 m), weight 212 lb 11.2 oz (96.5 kg), SpO2 98 %. ECOG: 1 General appearance: Well-appearing gentleman appeared without distress. Head: Atraumatic and normocephalic. Oropharynx: No oral ulcers or thrush. Eyes: No scleral icterus. Lymph nodes: Cervical, supraclavicular, and axillary nodes normal. Heart: No murmurs rubs or gallops.  Regular rate and rhythm. Lung: Clear in all lung fields.  No rhonchi or wheezes or dullness to percussion. Abdomin: No rebound or guarding.  Soft, nontender with good bowel sounds. EXT:no erythema, induration, or nodules Neurological: No deficits motor or sensory. Skeletal: No joint swelling.  Range of motion normal in his upper and lower extremity joints. Psychiatric: Appropriate mood and affect.   Lab Results: Lab Results  Component Value Date   WBC 7.2 12/26/2017   HGB 13.5 12/26/2017   HCT 41.0 12/26/2017   MCV 83.4 12/26/2017   PLT 143 12/26/2017     Chemistry      Component Value Date/Time   NA 140 11/21/2017 1444   K 3.6 11/21/2017 1444   CO2 25 11/21/2017 1444   BUN 14.2 11/21/2017 1444   CREATININE 1.1 11/21/2017 1444      Component Value Date/Time   CALCIUM 9.4 11/21/2017 1444   ALKPHOS 119 11/21/2017 1444   AST 29 11/21/2017 1444   ALT 32 11/21/2017 1444   BILITOT 0.40 11/21/2017 1444     Results for Adam Cruz, Adam Cruz (MRN [id number]) as of 12/26/2017 16:36  Ref. Range 09/05/2017 15:10 10/12/2017 13:16 11/21/2017 14:44  Prostate Specific Ag, Serum Latest Ref Range: 0.0 - 4.0 ng/mL <0.1 <0.1 <0.1      Impression and Plan:  57 year old gentleman with the following issues:  1.  Hormone sensitive advanced prostate cancer diagnosed in June 2018. He presented with a PSA of 66 and found to have a Gleason score 4+4 = 8 and three quarters and Gleason score 4+3 = 7 and 3 other cores. Hie has d bulky pelvic lymphadenopathy without bone disease.  He was started on androgen deprivation therapy in the form of Lupron in July 2018.  Zytiga 1000 mg daily added to androgen deprivation therapy in August 2018.   His PSA in December 2018 continues to be undetectable.  The natural course of this disease was reviewed today with the patient and his family.  He understands treatment at this time is palliative and not curative.  The addition of Zytiga to androgen deprivation  therapy improves overall survival as well as disease control long term.  Complications associated with this medication were reviewed again and he appears to tolerate this medication reasonably well and willing to continue.  He is experiencing muscle weakness which could be related to [facility] but could also be related to decreased testosterone state.   Plan to repeat staging workup with a CT scan in June 2019.  2. Androgen deprivation therapy: He is currently on Lupron 30 mg every 4 months.  He received Lupron in December and will receive the next injection in March 2019.  Complications associated with long-term androgen deprivation was reviewed today which includes muscle weakness, weight gain, osteoporosis among others.  3. Genetic counseling: He has clear family history of breast cancer and BRCA mutation. He completed genetic counseling and BRCA testing.  The implication of this genetic testing was discussed again.  He is also aware of the therapeutic ramification in the future as well.  4.  Memory issues: Unclear etiology.  He has been evaluated by neurology with an MRI obtained on 12/10/2017 was reviewed and showed no acute  pathology.  5.  Elevated liver function tests: Related to Zytiga.  Liver function test repeated on December 26, 2017 and reviewed today and all within normal range with a mild elevation in his AST at 35.  6.  Hypokalemia: His potassium repeated today and currently at 3.6.  His hypokalemia is related to [facility] and he is on adequate potassium replacement.  7.  Muscle weakness: Related to androgen deprivation therapy.  We discussed different strategies to improve his muscle tone and thickness including continuing exercises which include walking on the treadmill and stretching.  8. Follow-up: Will be in 4 weeks to follow his progress.  More than 30 minutes spent on today's encounter with 25 minutes of that was face-to-face with the patient today.  Greater than 50% of the time was spent counseling, coordination of care and discussing different therapeutic options.    Zola Button, MD 1/8/20194:25 PM

## 2017-12-28 ENCOUNTER — Telehealth: Payer: Self-pay | Admitting: *Deleted

## 2017-12-28 LAB — PROSTATE-SPECIFIC AG, SERUM (LABCORP)

## 2017-12-28 NOTE — Telephone Encounter (Signed)
As noted below by Dr. Shadad, I informed patient of his PSA level. He verbalized understanding.  

## 2017-12-28 NOTE — Telephone Encounter (Signed)
-----   Message from Wyatt Portela, MD sent at 12/28/2017 10:24 AM EST ----- Please let him know his PSA is still low

## 2018-01-16 ENCOUNTER — Other Ambulatory Visit: Payer: Self-pay | Admitting: Oncology

## 2018-01-16 DIAGNOSIS — C61 Malignant neoplasm of prostate: Secondary | ICD-10-CM

## 2018-01-23 ENCOUNTER — Telehealth: Payer: Self-pay | Admitting: Medical Oncology

## 2018-01-23 NOTE — Telephone Encounter (Signed)
Spoke with Adam Cruz to see if he would be wiling to participate in a prostate research lab draw. I informed him that one of the research nurses will obtain a consent form and he will receive a gift card. He states he is willing to participate. I asked him to arrive 30 minutes early. He voiced understanding.

## 2018-01-24 ENCOUNTER — Encounter: Payer: Self-pay | Admitting: Medical Oncology

## 2018-01-25 ENCOUNTER — Other Ambulatory Visit: Payer: Self-pay | Admitting: Medical Oncology

## 2018-01-25 ENCOUNTER — Inpatient Hospital Stay: Payer: BLUE CROSS/BLUE SHIELD | Admitting: Medical Oncology

## 2018-01-25 ENCOUNTER — Inpatient Hospital Stay: Payer: BLUE CROSS/BLUE SHIELD

## 2018-01-25 ENCOUNTER — Inpatient Hospital Stay: Payer: BLUE CROSS/BLUE SHIELD | Attending: Radiation Oncology | Admitting: Oncology

## 2018-01-25 ENCOUNTER — Telehealth: Payer: Self-pay | Admitting: Oncology

## 2018-01-25 ENCOUNTER — Telehealth: Payer: Self-pay | Admitting: *Deleted

## 2018-01-25 VITALS — BP 140/78 | HR 84 | Temp 98.9°F | Resp 18 | Ht 72.0 in | Wt 208.8 lb

## 2018-01-25 DIAGNOSIS — C61 Malignant neoplasm of prostate: Secondary | ICD-10-CM | POA: Diagnosis not present

## 2018-01-25 DIAGNOSIS — R351 Nocturia: Secondary | ICD-10-CM | POA: Insufficient documentation

## 2018-01-25 DIAGNOSIS — E291 Testicular hypofunction: Secondary | ICD-10-CM | POA: Diagnosis not present

## 2018-01-25 DIAGNOSIS — E876 Hypokalemia: Secondary | ICD-10-CM | POA: Diagnosis not present

## 2018-01-25 LAB — CMP (CANCER CENTER ONLY)
ALK PHOS: 127 U/L (ref 40–150)
ALT: 69 U/L — AB (ref 0–55)
AST: 65 U/L — ABNORMAL HIGH (ref 5–34)
Albumin: 3.4 g/dL — ABNORMAL LOW (ref 3.5–5.0)
Anion gap: 12 — ABNORMAL HIGH (ref 3–11)
BUN: 14 mg/dL (ref 7–26)
CALCIUM: 9.9 mg/dL (ref 8.4–10.4)
CO2: 27 mmol/L (ref 22–29)
CREATININE: 1.01 mg/dL (ref 0.70–1.30)
Chloride: 102 mmol/L (ref 98–109)
GFR, Est AFR Am: >60 mL/min (ref >60)
Glucose, Bld: 255 mg/dL — ABNORMAL HIGH (ref 70–140)
Potassium: 2.5 mmol/L — CL (ref 3.5–5.1)
Sodium: 141 mmol/L (ref 136–145)
Total Bilirubin: 0.6 mg/dL (ref 0.2–1.2)
Total Protein: 6.9 g/dL (ref 6.4–8.3)

## 2018-01-25 LAB — CBC WITH DIFFERENTIAL (CANCER CENTER ONLY)
BASOS ABS: 0.1 10*3/uL (ref 0.0–0.1)
BASOS PCT: 1 %
EOS ABS: 0.2 10*3/uL (ref 0.0–0.5)
Eosinophils Relative: 2 %
HCT: 40.2 % (ref 38.4–49.9)
HEMOGLOBIN: 13.6 g/dL (ref 13.0–17.1)
Lymphocytes Relative: 21 %
Lymphs Abs: 2.6 10*3/uL (ref 0.9–3.3)
MCH: 27.7 pg (ref 27.2–33.4)
MCHC: 33.8 g/dL (ref 32.0–36.0)
MCV: 81.8 fL (ref 79.3–98.0)
Monocytes Absolute: 1.1 10*3/uL — ABNORMAL HIGH (ref 0.1–0.9)
Monocytes Relative: 9 %
NEUTROS PCT: 67 %
Neutro Abs: 8.2 10*3/uL — ABNORMAL HIGH (ref 1.5–6.5)
Platelet Count: 167 10*3/uL (ref 140–400)
RBC: 4.92 MIL/uL (ref 4.20–5.82)
RDW: 14.1 % (ref 11.0–14.6)
WBC: 12.2 10*3/uL — AB (ref 4.0–10.3)

## 2018-01-25 LAB — RESEARCH LABS

## 2018-01-25 NOTE — Telephone Encounter (Signed)
Called and spoke to patient regarding potassium level of 2.5 today. Patient stopped taking his potassium because it's to hard to swallow. Instructed him to restart it and only take it once a day. Instructed him to put in applesauce and coat it. He verbalized understanding.

## 2018-01-25 NOTE — Telephone Encounter (Signed)
Gave avs and calendar for march °

## 2018-01-25 NOTE — Progress Notes (Signed)
Hematology and Oncology Follow Up Visit  Adam Cruz [id number] December 11, 1961 57 y.o. 01/25/2018 3:30 PM Adam Cruz, MDSaeed, Adam Leo, MD   Principle Diagnosis: 57 year old man with advanced prostate cancer diagnosed in June 2018 with lymphadenopathy and bone disease. He presented with a PSA of 66 and found to have a Gleason score 4+4 = 8.    Prior Therapy:  Androgen deprivation therapy started in July 2018.  Current therapy:  Lupron 30 mg every 4 months given on November 21, 2017.   Zytiga 1000 mg daily started in August 2018.  Interim History: Adam Cruz presents today for a follow-up visit with his wife.  He continues to take Zytiga at 1000 mg daily without any new complications.  He denied any worsening edema, nausea, excessive fatigue or vomiting.  He does report periodic weakness but appears to have improved in Adam last few weeks.  He does use a cane for stability and has not had any falls or syncope.  He denies any new bone pain or pathological fractures.  He denies any lower extremity edema.  He continues to have issues with frequency but no hematuria or dysuria.  He does report nocturia at times but has been manageable.  His performance status and activity level remains limited but not declining.  He does not report any headaches, blurry vision, syncope.  He denies any peripheral neuropathy.  He does not report any fevers, chills, sweats. He does not report any chest pain, palpitation, orthopnea or leg edema. He does not report any cough, wheezing, hemoptysis or shortness of breath. He does not report any nausea, vomiting or abdominal pain. He does not report any constipation, diarrhea or change in his bowel habits.  He does not report any arthralgias or myalgias.  He denied any lymphadenopathy, petechiae or bruises.  He denies any mood issues such as anxiety or depression.  He denies any heat or cold intolerance.  He does not report any skin rashes or lesions.  Remaining review  of systems is negative.  Medications: I have reviewed Adam Cruz's current medications.  Current Outpatient Medications  Medication Sig Dispense Refill  . abiraterone acetate (ZYTIGA) 250 MG tablet TAKE 4 TABLETS BY MOUTH DAILY . TAKE ON AN EMPTY STOMACH 1 HOUR BEFORE OR 2 HOURS AFTER A MEAL 120 tablet 1  . amitriptyline (ELAVIL) 25 MG tablet     . ASPIRIN 81 PO TK 1 T PO QD  6  . bicalutamide (CASODEX) 50 MG tablet TK 1 T PO  D  3  . Cholecalciferol (VITAMIN D3) 5000 units CAPS TAKE 1 BY MOUTH DAILY    . empagliflozin (JARDIANCE) 10 MG TABS tablet     . Gabapentin Enacarbil (HORIZANT) 600 MG TBCR TK 1 T PO BID    . glimepiride (AMARYL) 4 MG tablet TK 1 T PO QD  2  . GLIPIZIDE PO Take by mouth.    Marland Kitchen HYDROcodone-acetaminophen (NORCO/VICODIN) 5-325 MG tablet Take by mouth.    . Insulin Glargine-Lixisenatide 100-33 UNT-MCG/ML SOPN Inject into Adam skin.    Marland Kitchen levothyroxine (SYNTHROID, LEVOTHROID) 125 MCG tablet     . lisinopril (PRINIVIL,ZESTRIL) 5 MG tablet TAKE 1 TABLET BY MOUTH DAILY    . metoprolol succinate (TOPROL-XL) 25 MG 24 hr tablet   3  . omeprazole (PRILOSEC) 40 MG capsule TAKE 1 CAPSULE BY MOUTH ONCE DAILY BEFORE A MEAL IN COMBINATION WITH CLARITHROMYCIN    . potassium chloride SA (K-DUR,KLOR-CON) 20 MEQ tablet Take 1 tablet (20 mEq total) by  mouth daily. 30 tablet 0  . rosuvastatin (CRESTOR) 40 MG tablet     . silver sulfADIAZINE (SILVADENE) 1 % cream Apply topically.    . ticagrelor (BRILINTA) 90 MG TABS tablet Take by mouth.     No current facility-administered medications for this visit.      Allergies: No Known Allergies  Past Medical History, Surgical history, Social history, and Family History are unchanged.  He denies any home or tobacco use.  Physical Exam: Blood pressure 140/78, pulse 84, temperature 98.9 F (37.2 C), temperature source Oral, resp. rate 18, height 6' (1.829 m), weight 208 lb 12.8 oz (94.7 kg), SpO2 99 %. ECOG: 1 General appearance: Alert, awake  gentleman without distress. Head: Normocephalic atraumatic. Oropharynx: Mucous membranes are moist and pink.  No oral ulcers or thrush. Eyes: No scleral icterus.  Pupils are equal and round and reactive to light. Lymph nodes: Cervical, supraclavicular, and axillary nodes normal. Heart: No murmurs rubs or gallops.  Regular rate and rhythm. Lung: Clear without rhonchi, wheezes or dullness to percussion. Abdomin: Soft, nontender without rebound or guarding.  Good bowel sounds. Neurological: No deficits motor or sensory.  Able to ambulate without difficulties. Skeletal: No joint deformity or effusion. Psychiatric: Appropriate affect.   Lab Results: Lab Results  Component Value Date   WBC 12.2 (H) 01/25/2018   HGB 13.5 12/26/2017   HCT 40.2 01/25/2018   MCV 81.8 01/25/2018   PLT 167 01/25/2018     Chemistry      Component Value Date/Time   NA 139 12/26/2017 1534   NA 140 11/21/2017 1444   K 3.6 12/26/2017 1534   K 3.6 11/21/2017 1444   CL 106 12/26/2017 1534   CO2 23 12/26/2017 1534   CO2 25 11/21/2017 1444   BUN 19 12/26/2017 1534   BUN 14.2 11/21/2017 1444   CREATININE 1.15 12/26/2017 1534   CREATININE 1.1 11/21/2017 1444      Component Value Date/Time   CALCIUM 9.8 12/26/2017 1534   CALCIUM 9.4 11/21/2017 1444   ALKPHOS 121 12/26/2017 1534   ALKPHOS 119 11/21/2017 1444   AST 35 (H) 12/26/2017 1534   AST 29 11/21/2017 1444   ALT 42 12/26/2017 1534   ALT 32 11/21/2017 1444   BILITOT 0.4 12/26/2017 1534   BILITOT 0.40 11/21/2017 1444     Results for Adam Cruz (MRN [id number]) as of 01/25/2018 15:04  Ref. Range 11/21/2017 14:44 12/26/2017 15:34  Prostate Specific Ag, Serum Latest Ref Range: 0.0 - 4.0 ng/mL <0.1 <0.1      Impression and Plan:  57 year old man with Adam following issues:  1.  Advanced prostate cancer that remains to be hormone sensitive diagnosed in June 2018.   He was started on androgen deprivation therapy in Adam form of Lupron in July  2018.  Zytiga 1000 mg daily added in August 2018.  Risks and benefits of continuing Zytiga was reviewed again with Adam Cruz today.  Long-term complication associated with medication which includes worsening arthralgias, myalgias, hypertension as well as edema.  We also discussed alternative options again which include systemic chemotherapy versus androgen deprivation therapy alone.  He feels Adam side effects associated with therapy is predominantly related to androgen deprivation rather than Zytiga.  After discussion today is agreeable to continue with Adam current therapy given that his PSA had an excellent response.  2. Androgen deprivation therapy: Risks and benefits of continuing Lupron long-term was reviewed.  He is currently on Lupron 30 mg every 4 months and  will be repeated in April 2019.  This will be continued indefinitely.  3. Genetic counseling: He has clear family history of breast cancer and BRCA mutation.  Adam therapeutic implication of this mutation was discussed.  Treatment with a PARP inhibitor could be an option in Adam future.  4.  Hypokalemia: Appropriate potassium supplements will be given to Adam Cruz.  His hypokalemia is related to Zytiga.  5.  Elevated liver function tests: Has normalized at this time.  This is related to Fabio Asa appears to be manageable.  6.  Muscle weakness: Related to androgen deprivation therapy as well as Zytiga.  Appears to be improving at this time.  7. Follow-up: Will be in 4 weeks to follow his progress.  25  minutes was spent with Adam Cruz face-to-face today.  More than 50% of time was dedicated to Cruz counseling, education and coordination of his care.     Zola Button, MD 2/7/20193:30 PM

## 2018-01-26 ENCOUNTER — Telehealth: Payer: Self-pay | Admitting: *Deleted

## 2018-01-26 LAB — PROSTATE-SPECIFIC AG, SERUM (LABCORP)

## 2018-01-26 NOTE — Telephone Encounter (Signed)
-----   Message from Wyatt Portela, MD sent at 01/26/2018  9:40 AM EST ----- Please let him know his PSA is low.

## 2018-01-26 NOTE — Telephone Encounter (Signed)
As noted below by Dr. [name], I informed him of his PSA level. He verbalized understanding.

## 2018-02-02 DIAGNOSIS — I10 Essential (primary) hypertension: Secondary | ICD-10-CM | POA: Diagnosis not present

## 2018-02-02 DIAGNOSIS — R51 Headache: Secondary | ICD-10-CM | POA: Diagnosis not present

## 2018-02-02 DIAGNOSIS — R079 Chest pain, unspecified: Secondary | ICD-10-CM | POA: Diagnosis not present

## 2018-02-02 DIAGNOSIS — J0110 Acute frontal sinusitis, unspecified: Secondary | ICD-10-CM | POA: Diagnosis not present

## 2018-02-05 DIAGNOSIS — E039 Hypothyroidism, unspecified: Secondary | ICD-10-CM | POA: Diagnosis not present

## 2018-02-05 DIAGNOSIS — E119 Type 2 diabetes mellitus without complications: Secondary | ICD-10-CM | POA: Diagnosis not present

## 2018-02-05 DIAGNOSIS — I10 Essential (primary) hypertension: Secondary | ICD-10-CM | POA: Diagnosis not present

## 2018-02-16 DIAGNOSIS — I10 Essential (primary) hypertension: Secondary | ICD-10-CM | POA: Diagnosis not present

## 2018-02-16 DIAGNOSIS — E785 Hyperlipidemia, unspecified: Secondary | ICD-10-CM | POA: Diagnosis not present

## 2018-02-16 DIAGNOSIS — Z794 Long term (current) use of insulin: Secondary | ICD-10-CM | POA: Diagnosis not present

## 2018-02-16 DIAGNOSIS — E1140 Type 2 diabetes mellitus with diabetic neuropathy, unspecified: Secondary | ICD-10-CM | POA: Diagnosis not present

## 2018-03-02 ENCOUNTER — Inpatient Hospital Stay: Payer: BLUE CROSS/BLUE SHIELD | Attending: Radiation Oncology | Admitting: Oncology

## 2018-03-02 ENCOUNTER — Telehealth: Payer: Self-pay

## 2018-03-02 ENCOUNTER — Inpatient Hospital Stay: Payer: BLUE CROSS/BLUE SHIELD

## 2018-03-02 VITALS — BP 133/67 | HR 77 | Temp 98.0°F | Resp 18 | Ht 72.0 in | Wt 207.7 lb

## 2018-03-02 DIAGNOSIS — R7989 Other specified abnormal findings of blood chemistry: Secondary | ICD-10-CM | POA: Diagnosis not present

## 2018-03-02 DIAGNOSIS — E291 Testicular hypofunction: Secondary | ICD-10-CM

## 2018-03-02 DIAGNOSIS — C61 Malignant neoplasm of prostate: Secondary | ICD-10-CM | POA: Diagnosis not present

## 2018-03-02 DIAGNOSIS — E119 Type 2 diabetes mellitus without complications: Secondary | ICD-10-CM

## 2018-03-02 DIAGNOSIS — R531 Weakness: Secondary | ICD-10-CM | POA: Insufficient documentation

## 2018-03-02 DIAGNOSIS — E876 Hypokalemia: Secondary | ICD-10-CM | POA: Diagnosis not present

## 2018-03-02 DIAGNOSIS — C778 Secondary and unspecified malignant neoplasm of lymph nodes of multiple regions: Secondary | ICD-10-CM

## 2018-03-02 DIAGNOSIS — Z794 Long term (current) use of insulin: Secondary | ICD-10-CM | POA: Diagnosis not present

## 2018-03-02 DIAGNOSIS — I10 Essential (primary) hypertension: Secondary | ICD-10-CM | POA: Diagnosis not present

## 2018-03-02 LAB — CBC WITH DIFFERENTIAL (CANCER CENTER ONLY)
BASOS ABS: 0.1 10*3/uL (ref 0.0–0.1)
Basophils Relative: 1 %
EOS PCT: 5 %
Eosinophils Absolute: 0.4 10*3/uL (ref 0.0–0.5)
HEMATOCRIT: 37.9 % — AB (ref 38.4–49.9)
HEMOGLOBIN: 12.9 g/dL — AB (ref 13.0–17.1)
LYMPHS PCT: 32 %
Lymphs Abs: 2.6 10*3/uL (ref 0.9–3.3)
MCH: 28.5 pg (ref 27.2–33.4)
MCHC: 34 g/dL (ref 32.0–36.0)
MCV: 83.7 fL (ref 79.3–98.0)
Monocytes Absolute: 0.8 10*3/uL (ref 0.1–0.9)
Monocytes Relative: 10 %
NEUTROS ABS: 4.2 10*3/uL (ref 1.5–6.5)
Neutrophils Relative %: 52 %
Platelet Count: 133 10*3/uL — ABNORMAL LOW (ref 140–400)
RBC: 4.53 MIL/uL (ref 4.20–5.82)
RDW: 13.5 % (ref 11.0–14.6)
WBC: 8 10*3/uL (ref 4.0–10.3)

## 2018-03-02 LAB — CMP (CANCER CENTER ONLY)
ALK PHOS: 133 U/L (ref 40–150)
ALT: 60 U/L — AB (ref 0–55)
AST: 48 U/L — AB (ref 5–34)
Albumin: 3.5 g/dL (ref 3.5–5.0)
Anion gap: 11 (ref 3–11)
BILIRUBIN TOTAL: 0.5 mg/dL (ref 0.2–1.2)
BUN: 21 mg/dL (ref 7–26)
CALCIUM: 10.6 mg/dL — AB (ref 8.4–10.4)
CO2: 27 mmol/L (ref 22–29)
CREATININE: 1.23 mg/dL (ref 0.70–1.30)
Chloride: 100 mmol/L (ref 98–109)
Glucose, Bld: 392 mg/dL — ABNORMAL HIGH (ref 70–140)
Potassium: 3 mmol/L — CL (ref 3.5–5.1)
Sodium: 138 mmol/L (ref 136–145)
Total Protein: 7 g/dL (ref 6.4–8.3)

## 2018-03-02 NOTE — Progress Notes (Signed)
Hematology and Oncology Follow Up Visit  Adam Cruz [id number] 05-19-1961 57 y.o. 03/02/2018 3:23 PM Adam Cruz, MDSaeed, Adam Leo, MD   Principle Diagnosis: 57 year old man with hormone sensitive advanced prostate cancer diagnosed in June 2018.  He presented with a PSA of 66, Gleason score 4+4 = 8 and abdominal adenopathy.   Prior Therapy:  Androgen deprivation therapy started in July 2018.  Current therapy:  Lupron 30 mg every 4 months given on November 21, 2017.  His next injection is scheduled for April 2019.  Zytiga 1000 mg daily started in August 2018.  Interim History: Adam Cruz is here for a follow-up.  Since the last visit, he had issues with hypertension and his blood pressure was elevated on multiple occasions in the last 3 weeks with symptoms of headaches at that time.  He was evaluated by his primary care physician and his blood pressure medication have been adjusted.  He is currently on lisinopril 20 mg in addition to hydrochlorothiazide that was added.  His blood pressure has been much better at this regimen but it took close to 3 weeks to adjust.  He also has been experiencing issues with alterations in his blood sugar with labile blood sugars above 300 and episodes of hypoglycemia.  He denies any recent hospitalizations or illnesses.  He continues to take Zytiga without any GI symptoms or excessive fatigue.  He takes at nighttime regularly without any missed doses. He denies any recent bone pain or pathological fractures.  His appetite has not changed dramatically and his weight is relatively stable. He does not report any headaches, blurry vision, syncope.  He denies any peripheral neuropathy.  He does not report any fevers, chills, sweats. He does not report any chest pain, palpitation, orthopnea or leg edema. He does not report any cough, wheezing, hemoptysis or shortness of breath. He does not report any nausea, vomiting or abdominal pain. He does not report any  constipation, diarrhea or change in his bowel habits.  He does not report any arthralgias or myalgias.  He denied any lymphadenopathy, petechiae or bruises.  He denies any mood issues such as anxiety or depression.  He denies any heat or cold intolerance.  He does not report any skin rashes or lesions.  Remaining review of systems is negative.  Medications: I have reviewed the patient's current medications.  Current Outpatient Medications  Medication Sig Dispense Refill  . abiraterone acetate (ZYTIGA) 250 MG tablet TAKE 4 TABLETS BY MOUTH DAILY . TAKE ON AN EMPTY STOMACH 1 HOUR BEFORE OR 2 HOURS AFTER A MEAL 120 tablet 1  . amitriptyline (ELAVIL) 25 MG tablet     . ASPIRIN 81 PO TK 1 T PO QD  6  . bicalutamide (CASODEX) 50 MG tablet TK 1 T PO  D  3  . carvedilol (COREG) 6.25 MG tablet   4  . Cholecalciferol (VITAMIN D3) 5000 units CAPS TAKE 1 BY MOUTH DAILY    . empagliflozin (JARDIANCE) 10 MG TABS tablet     . Gabapentin Enacarbil (HORIZANT) 600 MG TBCR TK 1 T PO BID    . glimepiride (AMARYL) 4 MG tablet TK 1 T PO QD  2  . GLIPIZIDE PO Take by mouth.    . hydrochlorothiazide (HYDRODIURIL) 25 MG tablet   3  . HYDROcodone-acetaminophen (NORCO/VICODIN) 5-325 MG tablet Take by mouth.    . Insulin Glargine-Lixisenatide 100-33 UNT-MCG/ML SOPN Inject into the skin.    Marland Kitchen JARDIANCE 25 MG TABS tablet TK 1 T PO  IN THE MORNING  4  . levothyroxine (SYNTHROID, LEVOTHROID) 125 MCG tablet     . lisinopril (PRINIVIL,ZESTRIL) 20 MG tablet TK 1 T PO D  6  . metoprolol succinate (TOPROL-XL) 25 MG 24 hr tablet   3  . omeprazole (PRILOSEC) 40 MG capsule TAKE 1 CAPSULE BY MOUTH ONCE DAILY BEFORE A MEAL IN COMBINATION WITH CLARITHROMYCIN    . potassium chloride SA (K-DUR,KLOR-CON) 20 MEQ tablet Take 1 tablet (20 mEq total) by mouth daily. 30 tablet 0  . rosuvastatin (CRESTOR) 40 MG tablet     . silver sulfADIAZINE (SILVADENE) 1 % cream Apply topically.    . ticagrelor (BRILINTA) 90 MG TABS tablet Take by mouth.      No current facility-administered medications for this visit.      Allergies: No Known Allergies  Past Medical History, Surgical history, Social history, and Family History are unchanged.  He denies any home or tobacco use.  Physical Exam: Blood pressure 133/67, pulse 77, temperature 98 F (36.7 C), resp. rate 18, height 6' (1.829 m), weight 207 lb 11.2 oz (94.2 kg), SpO2 97 %. ECOG: 1 General appearance: Well-appearing gentleman appeared without distress. Head: Atraumatic without abnormalities. Oropharynx: No oral thrush or ulcers. Eyes: Sclera anicteric. Lymph nodes: No lymphadenopathy noted in the cervical, supraclavicular or axillary areas. Heart: Regular rate and rhythm without murmurs or gallops. Lung: Clear to auscultation without any rhonchi, wheezes or dullness to percussion. Abdomin: Soft, nontender without any rebound or guarding. Neurological: No motor, sensory deficits.  Ambulating without difficulties. Skeletal: Full range of motion noted in all extremities. Psychiatric: Appropriate affect and mood.   Lab Results: Lab Results  Component Value Date   WBC 8.0 03/02/2018   HGB 13.5 12/26/2017   HCT 37.9 (L) 03/02/2018   MCV 83.7 03/02/2018   PLT 133 (L) 03/02/2018     Chemistry      Component Value Date/Time   NA 141 01/25/2018 1440   NA 140 11/21/2017 1444   K 2.5 (LL) 01/25/2018 1440   K 3.6 11/21/2017 1444   CL 102 01/25/2018 1440   CO2 27 01/25/2018 1440   CO2 25 11/21/2017 1444   BUN 14 01/25/2018 1440   BUN 14.2 11/21/2017 1444   CREATININE 1.01 01/25/2018 1440   CREATININE 1.1 11/21/2017 1444      Component Value Date/Time   CALCIUM 9.9 01/25/2018 1440   CALCIUM 9.4 11/21/2017 1444   ALKPHOS 127 01/25/2018 1440   ALKPHOS 119 11/21/2017 1444   AST 65 (H) 01/25/2018 1440   AST 29 11/21/2017 1444   ALT 69 (H) 01/25/2018 1440   ALT 32 11/21/2017 1444   BILITOT 0.6 01/25/2018 1440   BILITOT 0.40 11/21/2017 1444      Results for Adam Cruz, Adam Cruz (MRN [id number]) as of 03/02/2018 15:25  Ref. Range 12/26/2017 15:34 01/25/2018 14:40  Prostate Specific Ag, Serum Latest Ref Range: 0.0 - 4.0 ng/mL <0.1 <0.1      Impression and Plan:  57 year old man with the following issues:  1.  Advanced hormone sensitive prostate cancer with pelvic adenopathy diagnosed in June 2018.  He was treated initially with androgen deprivation alone in the form of Lupron.  Zytiga 1000 mg daily added in August 2018 and his PSA continues to be undetectable since that time.  He has experienced few complications related to this medication including hypokalemia, hypertension and labile blood sugar.  The risks and benefits of continuing this medication at the current dose versus dose reduction to  750 mg was discussed.  Discontinuation of Zytiga altogether and using systemic chemotherapy instead was also reviewed including long-term complication of both these measures.  After discussion today, he is agreeable to continue with Zytiga and will reduce the dose to 750 mg daily for the next month and and assess his blood pressure and blood sugar at the time.  If this maneuver helps with controlling these abnormalities, we will continue with that dose.  He is agreeable to this plan  After discussion today is agreeable to continue with the current therapy given that his PSA had an excellent response.  2. Androgen deprivation therapy: Lupron will continue at 30 mg every 4 months and will be given in April 2019.  He understands that the androgen deprivation therapy can cause worsening diabetes and hyperlipidemia control.  3. Genetic counseling: He has clear family history of breast cancer and BRCA mutation.  Repeat labs he may be needed when he developed castration resistant disease to assess for somatic or germline mutation which could have therapeutic implication.  4.  Hypokalemia: He is currently on potassium supplements which I recommended he continues.  5.  Elevated  liver function tests: Slightly elevated in February 2019 and will continue to monitor.  This is related to Zytiga.  6.  Muscle weakness: Multifactorial in nature related to Zytiga and androgen deprivation.  Appears to be manageable at this time.  7. Follow-up: Will be in 4 weeks to follow his progress.  25  minutes was spent with the patient face-to-face today.  More than 50% of time was dedicated to patient counseling, education and answering questions regarding different treatment options and complications.    Zola Button, MD 3/15/20193:23 PM

## 2018-03-02 NOTE — Telephone Encounter (Signed)
Printed avs and calender of upcoming appointment. Per 3/15 los 

## 2018-03-03 LAB — PROSTATE-SPECIFIC AG, SERUM (LABCORP): Prostate Specific Ag, Serum: <0.1 ng/mL (ref 0.0–4.0)

## 2018-03-05 ENCOUNTER — Telehealth: Payer: Self-pay | Admitting: *Deleted

## 2018-03-05 NOTE — Telephone Encounter (Signed)
Notified Pt of results.

## 2018-03-05 NOTE — Telephone Encounter (Signed)
-----   Message from Wyatt Portela, MD sent at 03/05/2018  8:27 AM EDT ----- Please let him know his PSA is low.

## 2018-03-26 DIAGNOSIS — E0865 Diabetes mellitus due to underlying condition with hyperglycemia: Secondary | ICD-10-CM | POA: Diagnosis not present

## 2018-03-26 DIAGNOSIS — E119 Type 2 diabetes mellitus without complications: Secondary | ICD-10-CM | POA: Diagnosis not present

## 2018-03-26 DIAGNOSIS — I10 Essential (primary) hypertension: Secondary | ICD-10-CM | POA: Diagnosis not present

## 2018-03-30 ENCOUNTER — Telehealth: Payer: Self-pay | Admitting: Oncology

## 2018-03-30 ENCOUNTER — Inpatient Hospital Stay: Payer: BLUE CROSS/BLUE SHIELD

## 2018-03-30 ENCOUNTER — Inpatient Hospital Stay: Payer: BLUE CROSS/BLUE SHIELD | Attending: Radiation Oncology | Admitting: Oncology

## 2018-03-30 VITALS — BP 113/68 | HR 84 | Temp 99.1°F | Resp 18 | Ht 72.0 in | Wt 203.2 lb

## 2018-03-30 DIAGNOSIS — C7951 Secondary malignant neoplasm of bone: Secondary | ICD-10-CM | POA: Diagnosis not present

## 2018-03-30 DIAGNOSIS — E876 Hypokalemia: Secondary | ICD-10-CM

## 2018-03-30 DIAGNOSIS — Z5111 Encounter for antineoplastic chemotherapy: Secondary | ICD-10-CM | POA: Diagnosis not present

## 2018-03-30 DIAGNOSIS — C61 Malignant neoplasm of prostate: Secondary | ICD-10-CM | POA: Insufficient documentation

## 2018-03-30 DIAGNOSIS — M545 Low back pain: Secondary | ICD-10-CM

## 2018-03-30 DIAGNOSIS — E291 Testicular hypofunction: Secondary | ICD-10-CM | POA: Diagnosis not present

## 2018-03-30 DIAGNOSIS — M6281 Muscle weakness (generalized): Secondary | ICD-10-CM | POA: Diagnosis not present

## 2018-03-30 LAB — CBC WITH DIFFERENTIAL (CANCER CENTER ONLY)
BASOS ABS: 0.1 10*3/uL (ref 0.0–0.1)
Basophils Relative: 1 %
EOS ABS: 0.4 10*3/uL (ref 0.0–0.5)
Eosinophils Relative: 5 %
HCT: 38 % — ABNORMAL LOW (ref 38.4–49.9)
HEMOGLOBIN: 13 g/dL (ref 13.0–17.1)
Lymphocytes Relative: 32 %
Lymphs Abs: 2.7 10*3/uL (ref 0.9–3.3)
MCH: 28.8 pg (ref 27.2–33.4)
MCHC: 34.2 g/dL (ref 32.0–36.0)
MCV: 84.1 fL (ref 79.3–98.0)
MONOS PCT: 9 %
Monocytes Absolute: 0.8 10*3/uL (ref 0.1–0.9)
NEUTROS ABS: 4.5 10*3/uL (ref 1.5–6.5)
NEUTROS PCT: 53 %
Platelet Count: 184 10*3/uL (ref 140–400)
RBC: 4.52 MIL/uL (ref 4.20–5.82)
RDW: 13.4 % (ref 11.0–14.6)
WBC: 8.4 10*3/uL (ref 4.0–10.3)

## 2018-03-30 LAB — CMP (CANCER CENTER ONLY)
ALBUMIN: 3.8 g/dL (ref 3.5–5.0)
ALT: 79 U/L — AB (ref 0–55)
AST: 58 U/L — AB (ref 5–34)
Alkaline Phosphatase: 110 U/L (ref 40–150)
Anion gap: 11 (ref 3–11)
BILIRUBIN TOTAL: 0.5 mg/dL (ref 0.2–1.2)
BUN: 21 mg/dL (ref 7–26)
CO2: 29 mmol/L (ref 22–29)
Calcium: 10.5 mg/dL — ABNORMAL HIGH (ref 8.4–10.4)
Chloride: 102 mmol/L (ref 98–109)
Creatinine: 1.02 mg/dL (ref 0.70–1.30)
GFR, Est AFR Am: >60 mL/min (ref >60)
GFR, Estimated: >60 mL/min (ref >60)
GLUCOSE: 223 mg/dL — AB (ref 70–140)
Potassium: 2.7 mmol/L — CL (ref 3.5–5.1)
SODIUM: 142 mmol/L (ref 136–145)
Total Protein: 7.5 g/dL (ref 6.4–8.3)

## 2018-03-30 MED ORDER — LEUPROLIDE ACETATE (4 MONTH) 30 MG IM KIT
30.0000 mg | PACK | Freq: Once | INTRAMUSCULAR | Status: AC
  Administered 2018-03-30: 30 mg via INTRAMUSCULAR
  Filled 2018-03-30: qty 30

## 2018-03-30 NOTE — Progress Notes (Signed)
Hematology and Oncology Follow Up Visit  WILLOUGHBY DOELL [id number] Dec 24, 1960 57 y.o. 03/30/2018 3:10 PM [name], MDSaeed, [name], MD   Principle Diagnosis: 57 year old man with castration sensitive prostate cancer with lymphadenopathy diagnosed in June 2018.  He presented with a PSA of 66, Gleason score 4+4 = 8.  He had advanced disease on presenting.   Prior Therapy:  Androgen deprivation therapy started in July 2018.  Current therapy:  Lupron 30 mg every 4 months given on November 21, 2017.  His next injection is scheduled for April 2019.  Zytiga 1000 mg daily started in August 2018.  His dose was reduced to 750 mg daily started in March 2019.  Interim History: Mr. Lakey is here for a follow-up.  He reports improvement in his overall health since the last visit.  He noticed a difference in reducing the Zytiga dose with improvement in his quality of life.  His mobility has improved and he is no longer using a cane for ambulation.  He denies any falls or syncope.  He denies any nausea or decline in his appetite.  He still able to eat reasonably well although he has lost weight.  He denies any leg edema or excessive fatigue.  He continues to monitor his blood pressure periodically with occasional elevation at this time.  He continues to follow with his primary care physician regarding this issue.  He does report upper back pain at times when he exerts himself or stands for an extended period of time.  Not associated with any neurological deficits.   He does not report any headaches, blurry vision, syncope.  No seizures or neurological deficits.  He does not report any fevers, chills, sweats. He does not report any chest pain, palpitation, orthopnea.  He does not report any cough, wheezing, hemoptysis or shortness of breath. He does not report any nausea, vomiting or abdominal pain. He does not report any constipation, diarrhea or change in his bowel habits. He denied any  lymphadenopathy, petechiae.  He denies any anxiety or depression.  He denies any heat or cold intolerance.  He does not report any skin rashes or lesions.  Remaining review of systems is negative.  Medications: I have reviewed the patient's current medications.  Current Outpatient Medications  Medication Sig Dispense Refill  . abiraterone acetate (ZYTIGA) 250 MG tablet TAKE 4 TABLETS BY MOUTH DAILY . TAKE ON AN EMPTY STOMACH 1 HOUR BEFORE OR 2 HOURS AFTER A MEAL 120 tablet 1  . amitriptyline (ELAVIL) 25 MG tablet     . ASPIRIN 81 PO TK 1 T PO QD  6  . bicalutamide (CASODEX) 50 MG tablet TK 1 T PO  D  3  . carvedilol (COREG) 6.25 MG tablet   4  . Cholecalciferol (VITAMIN D3) 5000 units CAPS TAKE 1 BY MOUTH DAILY    . empagliflozin (JARDIANCE) 10 MG TABS tablet     . Gabapentin Enacarbil (HORIZANT) 600 MG TBCR TK 1 T PO BID    . glimepiride (AMARYL) 4 MG tablet TK 1 T PO QD  2  . GLIPIZIDE PO Take by mouth.    . hydrochlorothiazide (HYDRODIURIL) 25 MG tablet   3  . HYDROcodone-acetaminophen (NORCO/VICODIN) 5-325 MG tablet Take by mouth.    . Insulin Glargine-Lixisenatide 100-33 UNT-MCG/ML SOPN Inject into the skin.    Marland Kitchen JARDIANCE 25 MG TABS tablet TK 1 T PO IN THE MORNING  4  . levothyroxine (SYNTHROID, LEVOTHROID) 125 MCG tablet     .  lisinopril (PRINIVIL,ZESTRIL) 20 MG tablet TK 1 T PO D  6  . metoprolol succinate (TOPROL-XL) 25 MG 24 hr tablet   3  . omeprazole (PRILOSEC) 40 MG capsule TAKE 1 CAPSULE BY MOUTH ONCE DAILY BEFORE A MEAL IN COMBINATION WITH CLARITHROMYCIN    . potassium chloride SA (K-DUR,KLOR-CON) 20 MEQ tablet Take 1 tablet (20 mEq total) by mouth daily. 30 tablet 0  . rosuvastatin (CRESTOR) 40 MG tablet     . silver sulfADIAZINE (SILVADENE) 1 % cream Apply topically.    . ticagrelor (BRILINTA) 90 MG TABS tablet Take by mouth.     No current facility-administered medications for this visit.      Allergies: No Known Allergies  Past Medical History, Surgical history,  Social history, and Family History are unchanged.  He denies any home or tobacco use.  Physical Exam: Blood pressure 113/68, pulse 84, temperature 99.1 F (37.3 C), temperature source Oral, resp. rate 18, height 6' (1.829 m), weight 203 lb 3.2 oz (92.2 kg), SpO2 100 %.   ECOG: 1 General appearance: Alert, awake gentleman without distress. Head: Normocephalic without any masses. Oropharynx: Mucous membranes are moist and pink. Eyes: Pupils are equal and round reactive to light. Lymph nodes: Nocervical, supraclavicular or axillary lymph node enlargements noted. Heart: Regular rate and rhythm without any murmurs or gallops. Lung: Clear in all lung fields without any wheezes or dullness to percussion. Abdomin: Soft, nontender, nondistended with good bowel sounds. Neurological: No motor or sensory deficits.. Skeletal: No joint deformity or effusion.  No clubbing or cyanosis. Psychiatric: Mood and affect appeared normal.   Lab Results: Lab Results  Component Value Date   WBC 8.0 03/02/2018   HGB 13.5 12/26/2017   HCT 37.9 (L) 03/02/2018   MCV 83.7 03/02/2018   PLT 133 (L) 03/02/2018     Chemistry      Component Value Date/Time   NA 138 03/02/2018 1450   NA 140 11/21/2017 1444   K 3.0 (LL) 03/02/2018 1450   K 3.6 11/21/2017 1444   CL 100 03/02/2018 1450   CO2 27 03/02/2018 1450   CO2 25 11/21/2017 1444   BUN 21 03/02/2018 1450   BUN 14.2 11/21/2017 1444   CREATININE 1.23 03/02/2018 1450   CREATININE 1.1 11/21/2017 1444      Component Value Date/Time   CALCIUM 10.6 (H) 03/02/2018 1450   CALCIUM 9.4 11/21/2017 1444   ALKPHOS 133 03/02/2018 1450   ALKPHOS 119 11/21/2017 1444   AST 48 (H) 03/02/2018 1450   AST 29 11/21/2017 1444   ALT 60 (H) 03/02/2018 1450   ALT 32 11/21/2017 1444   BILITOT 0.5 03/02/2018 1450   BILITOT 0.40 11/21/2017 1444      Results for REIS, GOGA (MRN [id number]) as of 03/02/2018 15:25  Ref. Range 12/26/2017 15:34 01/25/2018 14:40  Prostate  Specific Ag, Serum Latest Ref Range: 0.0 - 4.0 ng/mL <0.1 <0.1      Impression and Plan:  57 year old man with the following issues:  1.  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  He was diagnosed with metastatic disease at the time of presentation in 2018.   He is currently taking Zytiga with the dose reduction in March 2019 with significant improvement in his symptoms.  He reports better tolerance and improvement in quality of life.  He is no longer reporting unsteadiness and arthralgias at this time.  Risks and benefits of continuing this medication at the current dose was reviewed today and  he is agreeable to continue.  Further dose reductions could be considered he develops any worsening symptoms.  We will arrange for staging workup including CT scan and a bone scan before the next visit to document continuous response to therapy.  His PSA remains undetectable.   2. Androgen deprivation therapy: Long-term complication associated with Lupron was reviewed today he is agreeable to continue.  Lupron will continue at 30 mg every 4 months will be given on 03/30/2018 and repeat every 4 months.  3. Genetic testing: Obtaining tissue biopsy for molecular testing will be helpful if he developed castration resistant disease.  He does have family history of BRCA which could be associated with his diagnosis.  This can offer a targetable mutation in the future.  4.  Hypokalemia: Related to Zytiga I have recommended continuing potassium supplements.  We will continue check his potassium levels periodically.  5.  Elevated liver function tests: Remains slightly elevated but no exacerbations noted.  6.  Muscle weakness: Improved with a dose reduction of Zytiga.  We will continue to monitor at this time.  7. Follow-up: Will be in 4 weeks to follow his progress.  25  minutes was spent with the patient face-to-face today.  More than 50% of time was dedicated to patient  counseling, education and coordinating his multifaceted care.   Zola Button, MD 4/12/20193:10 PM

## 2018-03-30 NOTE — Telephone Encounter (Signed)
Scheduled appt per 4/12 los - Gave patient AVS and calender per los.  

## 2018-03-30 NOTE — Patient Instructions (Signed)
Leuprolide depot injection What is this medicine? LEUPROLIDE (loo PROE lide) is a man-made protein that acts like a natural hormone in the body. It decreases testosterone in men and decreases estrogen in women. In men, this medicine is used to treat advanced prostate cancer. In women, some forms of this medicine may be used to treat endometriosis, uterine fibroids, or other male hormone-related problems. This medicine may be used for other purposes; ask your health care provider or pharmacist if you have questions. COMMON BRAND NAME(S): Eligard, Lupron Depot, Lupron Depot-Ped, Viadur What should I tell my health care provider before I take this medicine? They need to know if you have any of these conditions: -diabetes -heart disease or previous heart attack -high blood pressure -high cholesterol -mental illness -osteoporosis -pain or difficulty passing urine -seizures -spinal cord metastasis -stroke -suicidal thoughts, plans, or attempt; a previous suicide attempt by you or a family member -tobacco smoker -unusual vaginal bleeding (women) -an unusual or allergic reaction to leuprolide, benzyl alcohol, other medicines, foods, dyes, or preservatives -pregnant or trying to get pregnant -breast-feeding How should I use this medicine? This medicine is for injection into a muscle or for injection under the skin. It is given by a health care professional in a hospital or clinic setting. The specific product will determine how it will be given to you. Make sure you understand which product you receive and how often you will receive it. Talk to your pediatrician regarding the use of this medicine in children. Special care may be needed. Overdosage: If you think you have taken too much of this medicine contact a poison control center or emergency room at once. NOTE: This medicine is only for you. Do not share this medicine with others. What if I miss a dose? It is important not to miss a dose.  Call your doctor or health care professional if you are unable to keep an appointment. Depot injections: Depot injections are given either once-monthly, every 12 weeks, every 16 weeks, or every 24 weeks depending on the product you are prescribed. The product you are prescribed will be based on if you are male or male, and your condition. Make sure you understand your product and dosing. What may interact with this medicine? Do not take this medicine with any of the following medications: -chasteberry This medicine may also interact with the following medications: -herbal or dietary supplements, like black cohosh or DHEA -male hormones, like estrogens or progestins and birth control pills, patches, rings, or injections -male hormones, like testosterone This list may not describe all possible interactions. Give your health care provider a list of all the medicines, herbs, non-prescription drugs, or dietary supplements you use. Also tell them if you smoke, drink alcohol, or use illegal drugs. Some items may interact with your medicine. What should I watch for while using this medicine? Visit your doctor or health care professional for regular checks on your progress. During the first weeks of treatment, your symptoms may get worse, but then will improve as you continue your treatment. You may get hot flashes, increased bone pain, increased difficulty passing urine, or an aggravation of nerve symptoms. Discuss these effects with your doctor or health care professional, some of them may improve with continued use of this medicine. Male patients may experience a menstrual cycle or spotting during the first months of therapy with this medicine. If this continues, contact your doctor or health care professional. What side effects may I notice from receiving this medicine? Side   effects that you should report to your doctor or health care professional as soon as possible: -allergic reactions like skin  rash, itching or hives, swelling of the face, lips, or tongue -breathing problems -chest pain -depression or memory disorders -pain in your legs or groin -pain at site where injected or implanted -seizures -severe headache -swelling of the feet and legs -suicidal thoughts or other mood changes -visual changes -vomiting Side effects that usually do not require medical attention (report to your doctor or health care professional if they continue or are bothersome): -breast swelling or tenderness -decrease in sex drive or performance -diarrhea -hot flashes -loss of appetite -muscle, joint, or bone pains -nausea -redness or irritation at site where injected or implanted -skin problems or acne This list may not describe all possible side effects. Call your doctor for medical advice about side effects. You may report side effects to FDA at 1-800-FDA-1088. Where should I keep my medicine? This drug is given in a hospital or clinic and will not be stored at home. NOTE: This sheet is a summary. It may not cover all possible information. If you have questions about this medicine, talk to your doctor, pharmacist, or health care provider.  2018 Elsevier/Gold Standard (2016-05-19 09:45:53)  

## 2018-03-31 LAB — PROSTATE-SPECIFIC AG, SERUM (LABCORP): Prostate Specific Ag, Serum: <0.1 ng/mL (ref 0.0–4.0)

## 2018-04-03 ENCOUNTER — Telehealth: Payer: Self-pay | Admitting: *Deleted

## 2018-04-03 NOTE — Telephone Encounter (Signed)
-----   Message from Wyatt Portela, MD sent at 04/02/2018  9:26 AM EDT ----- Please let him know his PSA is low

## 2018-04-03 NOTE — Telephone Encounter (Signed)
As noted below by Dr. Shadad, I informed patient of his PSA level. He verbalized understanding.  

## 2018-04-04 ENCOUNTER — Other Ambulatory Visit: Payer: Self-pay | Admitting: Oncology

## 2018-04-04 DIAGNOSIS — C61 Malignant neoplasm of prostate: Secondary | ICD-10-CM

## 2018-04-05 DIAGNOSIS — I10 Essential (primary) hypertension: Secondary | ICD-10-CM | POA: Diagnosis not present

## 2018-04-17 ENCOUNTER — Encounter: Payer: BLUE CROSS/BLUE SHIELD | Admitting: Psychology

## 2018-04-24 DIAGNOSIS — I10 Essential (primary) hypertension: Secondary | ICD-10-CM | POA: Diagnosis not present

## 2018-04-24 DIAGNOSIS — E0865 Diabetes mellitus due to underlying condition with hyperglycemia: Secondary | ICD-10-CM | POA: Diagnosis not present

## 2018-04-24 DIAGNOSIS — Z794 Long term (current) use of insulin: Secondary | ICD-10-CM | POA: Diagnosis not present

## 2018-04-27 ENCOUNTER — Ambulatory Visit (HOSPITAL_COMMUNITY)
Admission: RE | Admit: 2018-04-27 | Discharge: 2018-04-27 | Disposition: A | Discharge status: Home / Self Care | Payer: BLUE CROSS/BLUE SHIELD | Source: Ambulatory Visit | Attending: Oncology | Admitting: Oncology

## 2018-04-27 ENCOUNTER — Encounter (HOSPITAL_COMMUNITY)
Admission: RE | Admit: 2018-04-27 | Discharge: 2018-04-27 | Disposition: A | Discharge status: Home / Self Care | Payer: BLUE CROSS/BLUE SHIELD | Source: Ambulatory Visit | Attending: Oncology | Admitting: Oncology

## 2018-04-27 ENCOUNTER — Encounter (HOSPITAL_COMMUNITY): Payer: Self-pay

## 2018-04-27 ENCOUNTER — Inpatient Hospital Stay: Payer: BLUE CROSS/BLUE SHIELD | Attending: Radiation Oncology

## 2018-04-27 DIAGNOSIS — I10 Essential (primary) hypertension: Secondary | ICD-10-CM | POA: Insufficient documentation

## 2018-04-27 DIAGNOSIS — E876 Hypokalemia: Secondary | ICD-10-CM | POA: Diagnosis not present

## 2018-04-27 DIAGNOSIS — C61 Malignant neoplasm of prostate: Secondary | ICD-10-CM | POA: Insufficient documentation

## 2018-04-27 DIAGNOSIS — E291 Testicular hypofunction: Secondary | ICD-10-CM | POA: Diagnosis not present

## 2018-04-27 LAB — CMP (CANCER CENTER ONLY)
ALBUMIN: 3.7 g/dL (ref 3.5–5.0)
ALT: 89 U/L — AB (ref 0–55)
AST: 71 U/L — AB (ref 5–34)
Alkaline Phosphatase: 74 U/L (ref 40–150)
Anion gap: 8 (ref 3–11)
BUN: 21 mg/dL (ref 7–26)
CHLORIDE: 103 mmol/L (ref 98–109)
CO2: 29 mmol/L (ref 22–29)
CREATININE: 1.05 mg/dL (ref 0.70–1.30)
Calcium: 10.1 mg/dL (ref 8.4–10.4)
GFR, Est AFR Am: >60 mL/min (ref >60)
GFR, Estimated: >60 mL/min (ref >60)
GLUCOSE: 214 mg/dL — AB (ref 70–140)
POTASSIUM: 3.2 mmol/L — AB (ref 3.5–5.1)
Sodium: 140 mmol/L (ref 136–145)
Total Bilirubin: 0.5 mg/dL (ref 0.2–1.2)
Total Protein: 6.8 g/dL (ref 6.4–8.3)

## 2018-04-27 LAB — CBC WITH DIFFERENTIAL (CANCER CENTER ONLY)
BASOS ABS: 0.1 10*3/uL (ref 0.0–0.1)
BASOS PCT: 1 %
EOS PCT: 5 %
Eosinophils Absolute: 0.4 10*3/uL (ref 0.0–0.5)
HEMATOCRIT: 36.3 % — AB (ref 38.4–49.9)
Hemoglobin: 12.2 g/dL — ABNORMAL LOW (ref 13.0–17.1)
LYMPHS PCT: 29 %
Lymphs Abs: 2.4 10*3/uL (ref 0.9–3.3)
MCH: 28.5 pg (ref 27.2–33.4)
MCHC: 33.7 g/dL (ref 32.0–36.0)
MCV: 84.5 fL (ref 79.3–98.0)
MONO ABS: 1 10*3/uL — AB (ref 0.1–0.9)
Monocytes Relative: 12 %
NEUTROS ABS: 4.4 10*3/uL (ref 1.5–6.5)
Neutrophils Relative %: 53 %
PLATELETS: 185 10*3/uL (ref 140–400)
RBC: 4.3 MIL/uL (ref 4.20–5.82)
RDW: 14.1 % (ref 11.0–14.6)
WBC Count: 8.4 10*3/uL (ref 4.0–10.3)

## 2018-04-27 MED ORDER — IOPAMIDOL (ISOVUE-300) INJECTION 61%
30.0000 mL | Freq: Once | INTRAVENOUS | Status: AC | PRN
  Administered 2018-04-27: 30 mL via ORAL

## 2018-04-27 MED ORDER — IOHEXOL 300 MG/ML  SOLN
100.0000 mL | Freq: Once | INTRAMUSCULAR | Status: AC | PRN
  Administered 2018-04-27: 100 mL via INTRAVENOUS

## 2018-04-27 MED ORDER — FLUDEOXYGLUCOSE F - 18 (FDG) INJECTION: 22.0000 | Freq: Once | INTRAVENOUS | Status: DC | PRN

## 2018-04-27 MED ORDER — TECHNETIUM TC 99M MEDRONATE IV KIT
20.0000 | PACK | Freq: Once | INTRAVENOUS | Status: AC | PRN
  Administered 2018-04-27: 20 via INTRAVENOUS

## 2018-04-28 LAB — PROSTATE-SPECIFIC AG, SERUM (LABCORP): Prostate Specific Ag, Serum: <0.1 ng/mL (ref 0.0–4.0)

## 2018-05-01 ENCOUNTER — Ambulatory Visit: Payer: BLUE CROSS/BLUE SHIELD | Admitting: Oncology

## 2018-05-01 ENCOUNTER — Encounter: Payer: BLUE CROSS/BLUE SHIELD | Admitting: Psychology

## 2018-05-01 ENCOUNTER — Inpatient Hospital Stay (HOSPITAL_BASED_OUTPATIENT_CLINIC_OR_DEPARTMENT_OTHER): Payer: BLUE CROSS/BLUE SHIELD | Admitting: Oncology

## 2018-05-01 VITALS — BP 129/73 | HR 89 | Temp 98.4°F | Resp 18 | Ht 73.0 in | Wt 200.8 lb

## 2018-05-01 DIAGNOSIS — E876 Hypokalemia: Secondary | ICD-10-CM | POA: Diagnosis not present

## 2018-05-01 DIAGNOSIS — I10 Essential (primary) hypertension: Secondary | ICD-10-CM | POA: Diagnosis not present

## 2018-05-01 DIAGNOSIS — E291 Testicular hypofunction: Secondary | ICD-10-CM

## 2018-05-01 DIAGNOSIS — C61 Malignant neoplasm of prostate: Secondary | ICD-10-CM | POA: Diagnosis not present

## 2018-05-01 NOTE — Progress Notes (Signed)
Hematology and Oncology Follow Up Visit  Adam Cruz [id number] 05-17-1961 57 y.o. 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  He presented with lymphadenopathy, PSA of 66, Gleason score 4+4 = 8.     Prior Therapy:  Androgen deprivation therapy started in July 2018.  Current therapy:  Lupron 30 mg every 4 months given on November 21, 2017.  His next injection is scheduled for April 2019.  Zytiga 1000 mg daily started in August 2018.  His dose was reduced to 750 mg daily started in March 2019.  Interim History: Adam Cruz is here for a follow-up.  Since the last visit, he reports continuous improvement in his overall health.  He continues to tolerate Zytiga at the current dose without any new complications.  He denies any worsening edema, excessive fatigue or syncope.  He is ambulating without the help of a cane without any falls or dizziness.  His blood pressure been under better control which have helped his symptoms dramatically.  He is potassium remains low and has been taking potassium supplements 3 times a day.  He is taking hydrochlorothiazide twice a day which is contributing to his hypokalemia.  He does not report any headaches, blurry vision, syncope.  No motor or sensory deficits.  He does not report any fevers, chills, sweats. He does not report any chest pain, palpitation, orthopnea.  He does not report any cough, wheezing, hemoptysis or dyspnea on exertion. He does not report any nausea, vomiting or abdominal pain. He does not report any constipation, diarrhea or change in his bowel habits. He denied any lymphadenopathy, petechiae.  He denies any anxiety or depression.  He denies any heat or cold intolerance.  He does not report any skin rashes or lesions.  Does not report any bone pain or arthralgias.  Remaining review of systems is negative.  Medications: I  have reviewed the patient's current medications.  Current Outpatient Medications  Medication Sig Dispense Refill  . abiraterone acetate (ZYTIGA) 250 MG tablet TAKE 4 TABLETS BY MOUTH DAILY. TAKE ON AN EMPTY STOMACH 1 HOUR BEFORE OR 2 HOURS AFTER A MEAL. 120 tablet 0  . amitriptyline (ELAVIL) 25 MG tablet     . ASPIRIN 81 PO TK 1 T PO QD  6  . carvedilol (COREG) 6.25 MG tablet   4  . Cholecalciferol (VITAMIN D3) 5000 units CAPS TAKE 1 BY MOUTH DAILY    . Gabapentin Enacarbil (HORIZANT) 600 MG TBCR TK 1 T PO BID    . glimepiride (AMARYL) 4 MG tablet TK 1 T PO QD  2  . GLIPIZIDE PO Take by mouth.    . hydrochlorothiazide (HYDRODIURIL) 25 MG tablet   3  . Insulin Glargine-Lixisenatide 100-33 UNT-MCG/ML SOPN Inject into the skin.    Marland Kitchen JARDIANCE 25 MG TABS tablet TK 1 T PO IN THE MORNING  4  . levothyroxine (SYNTHROID, LEVOTHROID) 125 MCG tablet     . lisinopril (PRINIVIL,ZESTRIL) 20 MG tablet TK 1 T PO D  6  . metoprolol succinate (TOPROL-XL) 25 MG 24 hr tablet   3  . omeprazole (PRILOSEC) 40 MG capsule TAKE 1 CAPSULE BY MOUTH ONCE DAILY BEFORE A MEAL IN COMBINATION WITH CLARITHROMYCIN    . potassium chloride SA (K-DUR,KLOR-CON) 20 MEQ tablet Take 1 tablet (20 mEq total) by mouth daily. 30 tablet 0  . rosuvastatin (CRESTOR) 40 MG tablet     . ticagrelor (  BRILINTA) 90 MG TABS tablet Take by mouth.     No current facility-administered medications for this visit.      Allergies: No Known Allergies  Past Medical History, Surgical history, Social history, and Family History are unchanged.  He denies any home or tobacco use.  Physical Exam:  Blood pressure 129/73, pulse 89, temperature 98.4 F (36.9 C), temperature source Oral, resp. rate 18, height 6\' 1"  (1.854 m), weight 200 lb 12.8 oz (91.1 kg), SpO2 100 %.   ECOG: 1 General appearance: Well-appearing gentleman without distress. Head: Traumatic without abnormalities. Oropharynx: No oral thrush or ulcers. Eyes: Sclera anicteric. Lymph  nodes: No lymphadenopathy noted on exam the cervical, axillary or supraclavicular regions. Heart: Regular rate that any murmurs or gallops. Lung: Clear without any rhonchi, wheezes or dullness to percussion. Abdomin: Soft, good bowel sounds without any rebound or guarding. Neurological: No motor or sensory deficits. Skeletal: Full range of motion noted all joints. Skin: No rashes or lesions.   Lab Results: Lab Results  Component Value Date   WBC 8.4 04/27/2018   HGB 12.2 (L) 04/27/2018   HCT 36.3 (L) 04/27/2018   MCV 84.5 04/27/2018   PLT 185 04/27/2018     Chemistry      Component Value Date/Time   NA 140 04/27/2018 0802   NA 140 11/21/2017 1444   K 3.2 (L) 04/27/2018 0802   K 3.6 11/21/2017 1444   CL 103 04/27/2018 0802   CO2 29 04/27/2018 0802   CO2 25 11/21/2017 1444   BUN 21 04/27/2018 0802   BUN 14.2 11/21/2017 1444   CREATININE 1.05 04/27/2018 0802   CREATININE 1.1 11/21/2017 1444      Component Value Date/Time   CALCIUM 10.1 04/27/2018 0802   CALCIUM 9.4 11/21/2017 1444   ALKPHOS 74 04/27/2018 0802   ALKPHOS 119 11/21/2017 1444   AST 71 (H) 04/27/2018 0802   AST 29 11/21/2017 1444   ALT 89 (H) 04/27/2018 0802   ALT 32 11/21/2017 1444   BILITOT 0.5 04/27/2018 0802   BILITOT 0.40 11/21/2017 1444       Results for TERRYON, PINEIRO (MRN [id number]) as of 05/01/2018 16:07  Ref. Range 03/02/2018 14:50 03/30/2018 15:01 04/27/2018 08:02  Prostate Specific Ag, Serum Latest Ref Range: 0.0 - 4.0 ng/mL <0.1 <0.1 <0.1   EXAM: CT ABDOMEN AND PELVIS WITH CONTRAST  TECHNIQUE: Multidetector CT imaging of the abdomen and pelvis was performed using the standard protocol following bolus administration of intravenous contrast.  CONTRAST:  153mL OMNIPAQUE IOHEXOL 300 MG/ML  SOLN  COMPARISON:  06/19/2017  FINDINGS: Lower chest: Lung bases are clear.  Hepatobiliary: Liver is within normal limits.  Gallbladder is unremarkable. No intrahepatic or extrahepatic  ductal dilatation.  Pancreas: Within normal limits.  Spleen: Within normal limits.  Adrenals/Urinary Tract: Adrenal glands are within normal limits.  Kidneys are within normal limits.  No hydronephrosis.  Mild anterior bladder wall thickening (series 2/image 88).  Stomach/Bowel: Stomach is within normal limits.  No evidence of bowel obstruction.  Prior appendectomy.  Vascular/Lymphatic: No evidence of abdominal aortic aneurysm.  11 mm short axis portacaval node (series 2/image 32), likely reactive.  Prior retroperitoneal lymphadenopathy has essentially resolved.  Prior left pelvic lymphadenopathy is significantly improved. Residual 8 mm short axis left obturator node (series 2/image 83), previously 2.2 cm. 10 mm short axis left external iliac node (series 2/image 39), previously 2.6 cm.  Reproductive: Prostate is grossly unremarkable in this patient with known prostate cancer.  Other: No abdominopelvic  ascites.  Musculoskeletal: Sclerotic lesion along the posterior aspect of the left 7th rib (series 2/image 1), incompletely visualized. This may have been a minimally imaged on prior CT abdomen/pelvis dated 02/16/2010, suggesting that this reflects a benign bone island.  Otherwise, no focal osseous lesions.  Mild degenerative changes of the visualized thoracolumbar spine, most prominent at L5-S1.  IMPRESSION: Prostate is grossly unremarkable in this patient with history of prostate cancer.  Near complete resolution of prior abdominopelvic lymphadenopathy. Two residual left pelvic lymph nodes measure up to 10 mm short axis, as above.  Additional ancillary findings as above.  EXAM: NUCLEAR MEDICINE WHOLE BODY BONE SCAN  TECHNIQUE: Whole body anterior and posterior images were obtained approximately 3 hours after intravenous injection of radiopharmaceutical.  RADIOPHARMACEUTICALS:  22 mCi Technetium-87m MDP IV  COMPARISON:   06/19/2017  Correlation: CT abdomen and pelvis 04/27/2018  FINDINGS: Uptake at the shoulders, elbows, hips, knees, and feet, typically degenerative.  Mild uptake at the RIGHT maxilla, question sinus disease.  No definite foci of abnormal osseous tracer accumulation are identified to suggest osseous metastatic disease from prostate cancer.  Specifically no abnormal tracer localization is seen within the spine.  Expected urinary tract and soft tissue distribution of tracer.  IMPRESSION: No scintigraphic evidence of osseous metastatic disease.  Scattered degenerative type uptake at multiple joints.  RIGHT maxillary tracer localization favoring RIGHT maxillary sinus disease, recommend correlation with patient symptoms and may consider radiographic or CT assessment if clinically indicated    Impression and Plan:  57 year old man with:   1.  Castration-sensitive prostate cancer with disease to pelvic adenopathy diagnosed in 2018.    He has tolerated Zytiga at the current dose of 750 mg daily without complications.  His PSA remains undetectable.  CT scan and bone scan obtained on Apr 27, 2018 was personally reviewed and discussed with the patient today.  He had a complete response to therapy without any residual or detectable disease.  Risks and benefits of continuing this treatment was reviewed today and is agreeable to continue.  He is able to function better at a lower dose without any issues noted.  2. Androgen deprivation therapy: He receives Lupron every 4 months at 30 mg.  He will receive his next injection in July 2019.  3. Genetic testing: Has been offered to the patient he is considering it.  Somatic mutation testing will also be performed he develop recurrent disease.  4.  Hypokalemia: Related to Zytiga and hydrochlorothiazide.  He remains on potassium supplements and we will check labs periodically.  I urged him to reduce his hydrochlorothiazide dose was  coordination with his primary care physician.  5.  Liver function test surveillance: LFTs continues to be within normal range.  6.  Hypertension: Managed by his primary care physician. His blood pressure appears to be adequately controlled.  7. Follow-up: Will be in 4 weeks.   25 minutes was spent with the patient face-to-face today.  More than 50% of time was dedicated to patient counseling, education and coordination of his care.  Zola Button, MD 5/14/20194:11 PM

## 2018-05-03 ENCOUNTER — Other Ambulatory Visit: Payer: Self-pay | Admitting: Oncology

## 2018-05-03 DIAGNOSIS — C61 Malignant neoplasm of prostate: Secondary | ICD-10-CM

## 2018-05-25 ENCOUNTER — Other Ambulatory Visit: Payer: Self-pay | Admitting: Oncology

## 2018-05-25 DIAGNOSIS — C61 Malignant neoplasm of prostate: Secondary | ICD-10-CM

## 2018-05-28 DIAGNOSIS — E039 Hypothyroidism, unspecified: Secondary | ICD-10-CM | POA: Diagnosis not present

## 2018-05-28 DIAGNOSIS — E1142 Type 2 diabetes mellitus with diabetic polyneuropathy: Secondary | ICD-10-CM | POA: Diagnosis not present

## 2018-05-28 DIAGNOSIS — E785 Hyperlipidemia, unspecified: Secondary | ICD-10-CM | POA: Diagnosis not present

## 2018-05-28 DIAGNOSIS — I10 Essential (primary) hypertension: Secondary | ICD-10-CM | POA: Diagnosis not present

## 2018-06-06 ENCOUNTER — Inpatient Hospital Stay: Payer: BLUE CROSS/BLUE SHIELD | Attending: Radiation Oncology | Admitting: Oncology

## 2018-06-06 ENCOUNTER — Inpatient Hospital Stay: Payer: BLUE CROSS/BLUE SHIELD

## 2018-06-06 VITALS — BP 177/98 | HR 82 | Temp 98.5°F | Resp 18 | Ht 73.0 in | Wt 205.0 lb

## 2018-06-06 DIAGNOSIS — E119 Type 2 diabetes mellitus without complications: Secondary | ICD-10-CM | POA: Diagnosis not present

## 2018-06-06 DIAGNOSIS — E291 Testicular hypofunction: Secondary | ICD-10-CM | POA: Diagnosis not present

## 2018-06-06 DIAGNOSIS — C61 Malignant neoplasm of prostate: Secondary | ICD-10-CM | POA: Insufficient documentation

## 2018-06-06 DIAGNOSIS — I10 Essential (primary) hypertension: Secondary | ICD-10-CM | POA: Insufficient documentation

## 2018-06-06 DIAGNOSIS — E876 Hypokalemia: Secondary | ICD-10-CM | POA: Diagnosis not present

## 2018-06-06 DIAGNOSIS — Z79899 Other long term (current) drug therapy: Secondary | ICD-10-CM | POA: Insufficient documentation

## 2018-06-06 LAB — CMP (CANCER CENTER ONLY)
ALK PHOS: 80 U/L (ref 40–150)
ALT: 75 U/L — ABNORMAL HIGH (ref 0–55)
ANION GAP: 7 (ref 3–11)
AST: 76 U/L — ABNORMAL HIGH (ref 5–34)
Albumin: 3.7 g/dL (ref 3.5–5.0)
BUN: 20 mg/dL (ref 7–26)
CALCIUM: 10 mg/dL (ref 8.4–10.4)
CO2: 27 mmol/L (ref 22–29)
Chloride: 108 mmol/L (ref 98–109)
Creatinine: 0.85 mg/dL (ref 0.70–1.30)
GFR, Estimated: >60 mL/min (ref >60)
Glucose, Bld: 139 mg/dL (ref 70–140)
POTASSIUM: 3.2 mmol/L — AB (ref 3.5–5.1)
Sodium: 142 mmol/L (ref 136–145)
Total Bilirubin: 0.6 mg/dL (ref 0.2–1.2)
Total Protein: 6.8 g/dL (ref 6.4–8.3)

## 2018-06-06 LAB — CBC WITH DIFFERENTIAL (CANCER CENTER ONLY)
BASOS ABS: 0.1 10*3/uL (ref 0.0–0.1)
BASOS PCT: 1 %
Eosinophils Absolute: 0.3 10*3/uL (ref 0.0–0.5)
Eosinophils Relative: 4 %
HEMATOCRIT: 37.3 % — AB (ref 38.4–49.9)
HEMOGLOBIN: 12.6 g/dL — AB (ref 13.0–17.1)
LYMPHS PCT: 28 %
Lymphs Abs: 2.4 10*3/uL (ref 0.9–3.3)
MCH: 28.5 pg (ref 27.2–33.4)
MCHC: 33.9 g/dL (ref 32.0–36.0)
MCV: 83.9 fL (ref 79.3–98.0)
MONOS PCT: 10 %
Monocytes Absolute: 0.8 10*3/uL (ref 0.1–0.9)
NEUTROS ABS: 4.8 10*3/uL (ref 1.5–6.5)
NEUTROS PCT: 57 %
Platelet Count: 156 10*3/uL (ref 140–400)
RBC: 4.44 MIL/uL (ref 4.20–5.82)
RDW: 14.9 % — ABNORMAL HIGH (ref 11.0–14.6)
WBC Count: 8.3 10*3/uL (ref 4.0–10.3)

## 2018-06-06 MED ORDER — ABIRATERONE ACETATE 250 MG PO TABS: 750.0000 mg | ORAL_TABLET | Freq: Every day | ORAL | 3 refills | Status: DC

## 2018-06-06 NOTE — Progress Notes (Signed)
Hematology and Oncology Follow Up Visit  Adam Cruz [id number] 1961-03-15 58 y.o. 06/06/2018 12:16 PM [name], MDSaeed, [name], MD   Principle Diagnosis: 57 year old man with castration-sensitive prostate cancer presented with lymphadenopathy, PSA of 66, Gleason score 4+4 = 8.  In June 2018.   Prior Therapy:  Androgen deprivation therapy started in July 2018.  Current therapy:  Lupron 30 mg every 4 months given on November 21, 2017.  His next injection is scheduled for April 2019.  Zytiga 1000 mg daily started in August 2018.  His dose was reduced to 750 mg daily started in March 2019.  Interim History: Adam Cruz is here for a follow-up visit.  Since the last visit, he reports no major changes in his health.  He continues to tolerate Zytiga at a lower dose with much better tolerance.  He denies any new complications including excessive fatigue or worsening arthralgias.  He still ambulates without the help of a walker or cane.  He denies any falls or syncope.  He continues to have issues with fluctuation in his blood pressure at this time.  He denies any bone pain or pathological fractures.  He denies any changes in his urination habits.  He denies any dysuria or hematuria.  His appetite and performance status remain unchanged.  His quality of life remains reasonable.   He does not report any headaches, blurry vision, syncope.  He denied alteration in mental status or confusion.  He does not report any fevers, chills, sweats.  His weight has increased with improved appetite.  He does not report any chest pain, palpitation, orthopnea.  He does not report any cough, wheezing, hemoptysis or dyspnea on exertion. He does not report any nausea, vomiting or abdominal pain. He does not report any early satiety or hematochezia.  He denied any lymphadenopathy, petechiae.  He denies any anxiety or depression.  He denies any heat or cold intolerance.  He does not report any petechiae or  ecchymosis.  He denies any bleeding or thrombosis tendencies. Remaining review of systems is negative.  Medications: I have reviewed the patient's current medications.  Current Outpatient Medications  Medication Sig Dispense Refill  . abiraterone acetate (ZYTIGA) 250 MG tablet Take 3 tablets (750 mg total) by mouth daily. Take on an empty stomach 1 hour before or 2 hours after a meal 120 tablet 3  . amitriptyline (ELAVIL) 25 MG tablet     . ASPIRIN 81 PO TK 1 T PO QD  6  . carvedilol (COREG) 6.25 MG tablet   4  . Cholecalciferol (VITAMIN D3) 5000 units CAPS TAKE 1 BY MOUTH DAILY    . Gabapentin Enacarbil (HORIZANT) 600 MG TBCR TK 1 T PO BID    . glimepiride (AMARYL) 4 MG tablet TK 1 T PO QD  2  . GLIPIZIDE PO Take by mouth.    . Insulin Glargine-Lixisenatide 100-33 UNT-MCG/ML SOPN Inject into the skin.    Marland Kitchen levothyroxine (SYNTHROID, LEVOTHROID) 125 MCG tablet     . lisinopril (PRINIVIL,ZESTRIL) 20 MG tablet TK 1 T PO D  6  . metoprolol succinate (TOPROL-XL) 25 MG 24 hr tablet   3  . omeprazole (PRILOSEC) 40 MG capsule TAKE 1 CAPSULE BY MOUTH ONCE DAILY BEFORE A MEAL IN COMBINATION WITH CLARITHROMYCIN    . potassium chloride SA (K-DUR,KLOR-CON) 20 MEQ tablet Take 1 tablet (20 mEq total) by mouth daily. 30 tablet 0  . rosuvastatin (CRESTOR) 40 MG tablet     . ticagrelor (BRILINTA) 90  MG TABS tablet Take by mouth.    Marland Kitchen JARDIANCE 25 MG TABS tablet TK 1 T PO IN THE MORNING  4   No current facility-administered medications for this visit.      Allergies: No Known Allergies  Past Medical History, Surgical history, Social history, and Family History are unchanged.  He denies any home or tobacco use.  Physical Exam:  Blood pressure (!) 177/98, pulse 82, temperature 98.5 F (36.9 C), temperature source Oral, resp. rate 18, height 6\' 1"  (1.854 m), weight 205 lb (93 kg), SpO2 100 %.   ECOG: 1 General appearance: Alert, awake gentleman without distress. Head: Normocephalic without  abnormalities. Oropharynx: Mucous membranes are moist and pink. Eyes: Pupils are equal and round reactive to light. Lymph nodes: No cervical, axillary or supraclavicular lymphadenopathy. Heart: Regular rate and rhythm without murmurs.  S1 and S2 auscultated without leg edema. Lung: Clear in all lung fields without any rhonchi or wheezes.  No shifting dullness or ascites. Abdomin: Soft, nontender, nondistended with good bowel sounds. Neurological: No motor, sensory deficits. Skeletal: No joint deformity or effusion. Skin: No ecchymosis or petechiae.   Lab Results: Lab Results  Component Value Date   WBC 8.3 06/06/2018   HGB 12.6 (L) 06/06/2018   HCT 37.3 (L) 06/06/2018   MCV 83.9 06/06/2018   PLT 156 06/06/2018     Chemistry      Component Value Date/Time   NA 142 06/06/2018 1130   NA 140 11/21/2017 1444   K 3.2 (L) 06/06/2018 1130   K 3.6 11/21/2017 1444   CL 108 06/06/2018 1130   CO2 27 06/06/2018 1130   CO2 25 11/21/2017 1444   BUN 20 06/06/2018 1130   BUN 14.2 11/21/2017 1444   CREATININE 0.85 06/06/2018 1130   CREATININE 1.1 11/21/2017 1444      Component Value Date/Time   CALCIUM 10.0 06/06/2018 1130   CALCIUM 9.4 11/21/2017 1444   ALKPHOS 80 06/06/2018 1130   ALKPHOS 119 11/21/2017 1444   AST 76 (H) 06/06/2018 1130   AST 29 11/21/2017 1444   ALT 75 (H) 06/06/2018 1130   ALT 32 11/21/2017 1444   BILITOT 0.6 06/06/2018 1130   BILITOT 0.40 11/21/2017 1444       Results for Adam Cruz, Adam Cruz (MRN [id number]) as of 06/06/2018 12:08  Ref. Range 03/30/2018 15:01 04/27/2018 08:02  Prostate Specific Ag, Serum Latest Ref Range: 0.0 - 4.0 ng/mL <0.1 <0.1     Impression and Plan:  57 year old man with:   1.  Castration-sensitive prostate cancer diagnosed in June 2018 after presenting with elevated PSA of 66 and lymphadenopathy.      He is currently on Zytiga with a reduced dose for better tolerance of 750 mg daily.  He continues to tolerate this therapy  reasonably well without any new complaints.  He does have mild fatigue and fluctuations in his blood pressure but his arthralgias and myalgias have improved significantly.  Risks and benefits of continuing this medication at the current dose versus further dose reduction were discussed today and is agreeable to continue for the time being.  2. Androgen deprivation therapy: He is currently on Lupron at 30 mg every 4 months.  He will receive his injection in August 2019 with a next visit.  3.  Hypokalemia: Related to Zytiga and hydrochlorothiazide.  His potassium will be rechecked today and supplemented as needed.  Stopping hydrochlorothiazide will help his potassium moving forward.  4.  Diabetes: Blood sugar under reasonable control for  the time being.  No exacerbations noted associated with Zytiga.  5.  Liver function test surveillance: These will be repeated periodically on Zytiga.  He has mild elevation which has not changed dramatically in the past.  6.  Hypertension: His blood pressure remains elevated today and currently on lisinopril.  Hydrochlorothiazide has been discontinued.  He will have a follow-up with his primary care physician in the near future for further adjustments.  I recommend adding a different agent to replace hydrochlorothiazide potentially calcium channel blocker.  7. Follow-up: Will be in 8 weeks.   25 minutes was spent with the patient face-to-face today.  More than 50% of time was dedicated to patient counseling, education and answering questions regarding his future plan of care and alternative treatment options.  Zola Button, MD 6/19/201912:16 PM

## 2018-06-07 ENCOUNTER — Telehealth: Payer: Self-pay | Admitting: *Deleted

## 2018-06-07 LAB — PROSTATE-SPECIFIC AG, SERUM (LABCORP)

## 2018-06-07 NOTE — Telephone Encounter (Signed)
-----   Message from Wyatt Portela, MD sent at 06/07/2018  9:38 AM EDT ----- Please let him know his PSA is still low.

## 2018-06-07 NOTE — Telephone Encounter (Signed)
Spoke with patient. Gave results of last PSA. Faxed all labs and O.V. Note to dr [name] at [facility].

## 2018-06-20 DIAGNOSIS — E119 Type 2 diabetes mellitus without complications: Secondary | ICD-10-CM | POA: Diagnosis not present

## 2018-06-20 DIAGNOSIS — Z794 Long term (current) use of insulin: Secondary | ICD-10-CM | POA: Diagnosis not present

## 2018-06-21 ENCOUNTER — Other Ambulatory Visit: Payer: Self-pay | Admitting: Oncology

## 2018-06-21 DIAGNOSIS — C61 Malignant neoplasm of prostate: Secondary | ICD-10-CM

## 2018-07-02 DIAGNOSIS — E119 Type 2 diabetes mellitus without complications: Secondary | ICD-10-CM | POA: Diagnosis not present

## 2018-07-11 ENCOUNTER — Telehealth: Payer: Self-pay | Admitting: Pharmacist

## 2018-07-11 NOTE — Telephone Encounter (Signed)
Oral Oncology Pharmacist Encounter  Received call from patient with questions about Zytiga copayment. Patient currently receives his Zytiga from [employer] per the requirement of his [employer] [facility].  Patient had been receiving branded Zytiga, and his copayments were covered with manufacturer copayment card. [facility] pharmacy is now stating that patient's prescription insurance is mandating he use generic abiraterone as opposed to branded Zytiga. Patient's co-pay for the generic is approximately $150, and there is no copayment card available for generic abiraterone.  Test claim performed at the [facility] [place] revealed that they are not in network for patient's prescription insurance coverage, and patient continues to be mandated to fill his prescription at [facility].  I will call current dispensing pharmacy and see if they are able to fill patient's prescription with the branded product. Depending on rejection for branded product, oral oncology clinic will follow up appropriately.  Patient expressed understanding and appreciation. We will follow-up with patient once we have more information.  Patient instructed to reach out to his insurance company to see if they will cover branded product as well.  [name], PharmD, BCPS, BCOP  07/11/2018 3:14 PM Oral Oncology Clinic [phone]

## 2018-07-12 NOTE — Telephone Encounter (Signed)
Oral Oncology Patient Advocate Encounter  I was able to secure a co-pay card for Zytiga.   I called [facility] with the following information for the co-pay card: BIN: [id number] Group: [id number] ID: [id number]  I called the patient to let him and know that I was able to get the copay card for him and [facility] will be getting in touch with him in 1-2 days to schedule shipment.  Patient expressed understanding and appreciation.  Adam Cruz Patient [place] Phone [phone] Fax [phone]

## 2018-07-12 NOTE — Telephone Encounter (Signed)
Oral Oncology Pharmacist Encounter  Spoke with [facility]. Zytiga prescription DAW-1 given verbally to [facility] pharmacist. Rx will be processed through their system. Per representative, copay card will have to be renewed.  Patient updated that the ofice is working with pharmacy and will continue to update on progress.  [name], PharmD, BCPS, BCOP  07/12/2018 10:46 AM Oral Oncology Clinic [phone]

## 2018-07-31 ENCOUNTER — Inpatient Hospital Stay: Payer: BLUE CROSS/BLUE SHIELD | Attending: Radiation Oncology

## 2018-07-31 ENCOUNTER — Other Ambulatory Visit: Payer: BLUE CROSS/BLUE SHIELD

## 2018-07-31 ENCOUNTER — Inpatient Hospital Stay: Payer: BLUE CROSS/BLUE SHIELD

## 2018-07-31 ENCOUNTER — Ambulatory Visit: Payer: BLUE CROSS/BLUE SHIELD | Admitting: Oncology

## 2018-07-31 ENCOUNTER — Inpatient Hospital Stay (HOSPITAL_BASED_OUTPATIENT_CLINIC_OR_DEPARTMENT_OTHER): Payer: BLUE CROSS/BLUE SHIELD | Admitting: Oncology

## 2018-07-31 ENCOUNTER — Telehealth: Payer: Self-pay | Admitting: Oncology

## 2018-07-31 VITALS — BP 163/92 | HR 74 | Temp 98.6°F | Resp 18 | Ht 73.0 in | Wt 201.6 lb

## 2018-07-31 DIAGNOSIS — M2550 Pain in unspecified joint: Secondary | ICD-10-CM

## 2018-07-31 DIAGNOSIS — R7989 Other specified abnormal findings of blood chemistry: Secondary | ICD-10-CM

## 2018-07-31 DIAGNOSIS — E119 Type 2 diabetes mellitus without complications: Secondary | ICD-10-CM | POA: Diagnosis not present

## 2018-07-31 DIAGNOSIS — E876 Hypokalemia: Secondary | ICD-10-CM | POA: Insufficient documentation

## 2018-07-31 DIAGNOSIS — M6281 Muscle weakness (generalized): Secondary | ICD-10-CM | POA: Diagnosis not present

## 2018-07-31 DIAGNOSIS — Z794 Long term (current) use of insulin: Secondary | ICD-10-CM | POA: Insufficient documentation

## 2018-07-31 DIAGNOSIS — E291 Testicular hypofunction: Secondary | ICD-10-CM

## 2018-07-31 DIAGNOSIS — Z5111 Encounter for antineoplastic chemotherapy: Secondary | ICD-10-CM | POA: Insufficient documentation

## 2018-07-31 DIAGNOSIS — I10 Essential (primary) hypertension: Secondary | ICD-10-CM

## 2018-07-31 DIAGNOSIS — C61 Malignant neoplasm of prostate: Secondary | ICD-10-CM

## 2018-07-31 DIAGNOSIS — Z79899 Other long term (current) drug therapy: Secondary | ICD-10-CM | POA: Diagnosis not present

## 2018-07-31 LAB — CK: CK TOTAL: 443 U/L — AB (ref 49–397)

## 2018-07-31 LAB — CBC WITH DIFFERENTIAL (CANCER CENTER ONLY)
BASOS ABS: 0.1 10*3/uL (ref 0.0–0.1)
Basophils Relative: 1 %
EOS PCT: 4 %
Eosinophils Absolute: 0.3 10*3/uL (ref 0.0–0.5)
HEMATOCRIT: 37.9 % — AB (ref 38.4–49.9)
HEMOGLOBIN: 12.8 g/dL — AB (ref 13.0–17.1)
LYMPHS ABS: 2.5 10*3/uL (ref 0.9–3.3)
LYMPHS PCT: 28 %
MCH: 28.4 pg (ref 27.2–33.4)
MCHC: 33.8 g/dL (ref 32.0–36.0)
MCV: 84 fL (ref 79.3–98.0)
Monocytes Absolute: 0.7 10*3/uL (ref 0.1–0.9)
Monocytes Relative: 8 %
NEUTROS ABS: 5.4 10*3/uL (ref 1.5–6.5)
NEUTROS PCT: 59 %
PLATELETS: 173 10*3/uL (ref 140–400)
RBC: 4.51 MIL/uL (ref 4.20–5.82)
RDW: 14.3 % (ref 11.0–14.6)
WBC: 9 10*3/uL (ref 4.0–10.3)

## 2018-07-31 LAB — CMP (CANCER CENTER ONLY)
ALT: 82 U/L — ABNORMAL HIGH (ref 0–44)
ANION GAP: 13 (ref 5–15)
AST: 73 U/L — AB (ref 15–41)
Albumin: 3.7 g/dL (ref 3.5–5.0)
Alkaline Phosphatase: 94 U/L (ref 38–126)
BILIRUBIN TOTAL: 0.7 mg/dL (ref 0.3–1.2)
BUN: 14 mg/dL (ref 6–20)
CO2: 25 mmol/L (ref 22–32)
Calcium: 9.3 mg/dL (ref 8.9–10.3)
Chloride: 106 mmol/L (ref 98–111)
Creatinine: 0.94 mg/dL (ref 0.61–1.24)
GFR, Est AFR Am: >60 mL/min (ref >60)
Glucose, Bld: 183 mg/dL — ABNORMAL HIGH (ref 70–99)
POTASSIUM: 2.6 mmol/L — AB (ref 3.5–5.1)
Sodium: 144 mmol/L (ref 135–145)
TOTAL PROTEIN: 7 g/dL (ref 6.5–8.1)

## 2018-07-31 LAB — C-REACTIVE PROTEIN: CRP: 1 mg/dL — ABNORMAL HIGH (ref <1.0)

## 2018-07-31 LAB — SEDIMENTATION RATE: Sed Rate: 19 mm/hr — ABNORMAL HIGH (ref 0–16)

## 2018-07-31 MED ORDER — LEUPROLIDE ACETATE (4 MONTH) 30 MG IM KIT
30.0000 mg | PACK | Freq: Once | INTRAMUSCULAR | Status: AC
  Administered 2018-07-31: 30 mg via INTRAMUSCULAR
  Filled 2018-07-31: qty 30

## 2018-07-31 NOTE — Telephone Encounter (Signed)
Scheduled appt per 8/13 los - gave patient AVS and calender per los.  

## 2018-07-31 NOTE — Progress Notes (Signed)
Hematology and Oncology Follow Up Visit  Adam Cruz [id number] 04/29/1961 57 y.o. 07/31/2018 9:23 AM [name], MDSaeed, [name], MD   Principle Diagnosis: 57 year old man with advanced prostate cancer diagnosed in June 2018 with PSA of 66 and a Gleason score of 8.  He has castration-sensitive disease at this time.   Prior Therapy:  Androgen deprivation therapy started in July 2018.  Current therapy:  Lupron 30 mg every 4 months given on November 21, 2017.  His next injection is scheduled for April 2019.  Zytiga 1000 mg daily started in August 2018.  His dose was reduced to 750 mg daily started in March 2019.  Interim History: [name] presents today for a follow-up.  Last visit, he has reported increased weakness in his lower extremities with achiness associated with it.  He has been having difficulties to this stand from a sitting position with difficulty ambulating at times.  He denies any neurological deficits or neuropathy.  He denies any falls or syncope.  He continues to take Zytiga lower dose without any other complications.  His appetite and performance status remains unchanged.  He does not report any headaches, blurry vision, syncope.  He denied dizziness or change in his mood.  He does not report any fevers, chills, sweats.  He does not report any chest pain, palpitation, orthopnea.  He does not report any cough, wheezing, hemoptysis or dyspnea on exertion. He does not report any nausea, vomiting or abdominal pain.  Denies any hematochezia or melena.  He denied any lymphadenopathy, petechiae.  He denies any use in his mood.    He does not report any bleeding or clotting tendencies.  He denies any skin rashes or lesions. Remaining review of systems is negative.  Medications: I have reviewed the patient's current medications.  Current Outpatient Medications  Medication Sig Dispense Refill  . abiraterone acetate (ZYTIGA) 250 MG tablet Take 3 tablets (750 mg total) by  mouth daily. Take on an empty stomach 1 hour before or 2 hours after a meal 120 tablet 3  . abiraterone acetate (ZYTIGA) 250 MG tablet TAKE 4 TABLETS BY MOUTH EVERY DAY TAKE ON AN EMPTY STOMACH 1 HOUR BEFORE OR 2 HOURS AFTER A MEAL 120 tablet 0  . amitriptyline (ELAVIL) 25 MG tablet     . ASPIRIN 81 PO TK 1 T PO QD  6  . carvedilol (COREG) 6.25 MG tablet   4  . Cholecalciferol (VITAMIN D3) 5000 units CAPS TAKE 1 BY MOUTH DAILY    . Gabapentin Enacarbil (HORIZANT) 600 MG TBCR TK 1 T PO BID    . glimepiride (AMARYL) 4 MG tablet TK 1 T PO QD  2  . GLIPIZIDE PO Take by mouth.    . Insulin Glargine-Lixisenatide 100-33 UNT-MCG/ML SOPN Inject into the skin.    Marland Kitchen JARDIANCE 25 MG TABS tablet TK 1 T PO IN THE MORNING  4  . levothyroxine (SYNTHROID, LEVOTHROID) 125 MCG tablet     . lisinopril (PRINIVIL,ZESTRIL) 20 MG tablet TK 1 T PO D  6  . metoprolol succinate (TOPROL-XL) 25 MG 24 hr tablet   3  . omeprazole (PRILOSEC) 40 MG capsule TAKE 1 CAPSULE BY MOUTH ONCE DAILY BEFORE A MEAL IN COMBINATION WITH CLARITHROMYCIN    . potassium chloride SA (K-DUR,KLOR-CON) 20 MEQ tablet Take 1 tablet (20 mEq total) by mouth daily. 30 tablet 0  . rosuvastatin (CRESTOR) 40 MG tablet     . ticagrelor (BRILINTA) 90 MG TABS tablet Take by  mouth.     No current facility-administered medications for this visit.      Allergies: No Known Allergies  Past Medical History, Surgical history, Social history, and Family History are unchanged.  He denies any home or tobacco use.  Physical Exam:    ECOG: 1 General appearance: Well-appearing gentleman without distress. Head: Atraumatic without normalities. Oropharynx: No oral thrush or ulcers. Eyes: Sclera anicteric. Lymph nodes: No lymphadenopathy noted in the cervical, axillary, inguinal or supraclavicular regions. Heart: Regular rate and rhythm.  S1 and S2 without any murmurs or gallops. Lung: Clear to auscultation without any wheezes or dullness to  percussion. Abdomin: Soft, without any rebound or guarding.  No shifting dullness or ascites. Neurological: No deficits, motor, sensory and deep tendon reflexes.  No focal deficits noted. Musculoskeletal: No joint deformity or effusion.  No joint tenderness or muscle pain noted. Skin: No skin rashes or lesions.   Lab Results: Lab Results  Component Value Date   WBC 8.3 06/06/2018   HGB 12.6 (L) 06/06/2018   HCT 37.3 (L) 06/06/2018   MCV 83.9 06/06/2018   PLT 156 06/06/2018     Chemistry      Component Value Date/Time   NA 142 06/06/2018 1130   NA 140 11/21/2017 1444   K 3.2 (L) 06/06/2018 1130   K 3.6 11/21/2017 1444   CL 108 06/06/2018 1130   CO2 27 06/06/2018 1130   CO2 25 11/21/2017 1444   BUN 20 06/06/2018 1130   BUN 14.2 11/21/2017 1444   CREATININE 0.85 06/06/2018 1130   CREATININE 1.1 11/21/2017 1444      Component Value Date/Time   CALCIUM 10.0 06/06/2018 1130   CALCIUM 9.4 11/21/2017 1444   ALKPHOS 80 06/06/2018 1130   ALKPHOS 119 11/21/2017 1444   AST 76 (H) 06/06/2018 1130   AST 29 11/21/2017 1444   ALT 75 (H) 06/06/2018 1130   ALT 32 11/21/2017 1444   BILITOT 0.6 06/06/2018 1130   BILITOT 0.40 11/21/2017 1444       Results for Adam Cruz, Adam Cruz (MRN [id number]) as of 07/31/2018 09:23  Ref. Range 04/27/2018 08:02 06/06/2018 11:30  Prostate Specific Ag, Serum Latest Ref Range: 0.0 - 4.0 ng/mL <0.1 <0.1      Impression and Plan:  57 year old man with:   1.  Castration-sensitive prostate cancer with lymphadenopathy and PSA of 66 diagnosed in June 2018.      He remains on Zytiga at 750 mg daily with excellent PSA response.  Risks and benefits of continuing this medication versus dose reduction was reviewed today.  After discussion today, he elected to keep on the current dose with consideration to increasing the dose to 500 mg if his symptoms persist.  I suspect his arthralgias and myalgias are likely related to Zytiga.  2. Androgen deprivation  therapy: He is receiving Lupron indefinitely without any complications noted.  Risks and benefits of continuing Lupron was reviewed today and is agreeable to receive it today and repeat in 4 months.  3.  Hypokalemia: His potassium is low and will be checked periodically.  Today is pending but has discontinued hydrochlorothiazide which should be helpful in maintaining his potassium in a higher level.  4.  Diabetes: No issues or exacerbations noted with his blood sugars.  5.  Liver function test surveillance: Mild elevation noted and has been relatively stable and generally improving.  6.  Hypertension: Blood pressure is mildly elevated today I will continue to monitor periodically.  He has follow-up with his  primary care physician this week.  7.  Arthralgias and muscle weakness: I suspect this is related to Zytiga versus other conditions such as a polymyalgia rheumatica or myositis.  I will obtain CPK, sedimentation rate and C-reactive protein.  Referral to rheumatology may be a possibility.  8. Follow-up: Will be in 8 weeks.   25 minutes was spent with the patient face-to-face today.  More than 50% of time was dedicated to discussing the natural course of this disease, discussing different treatment options and possible dose reductions as we manage side effects associated with this treatment.  Zola Button, MD 8/13/20199:23 AM

## 2018-07-31 NOTE — Patient Instructions (Signed)
Leuprolide depot injection What is this medicine? LEUPROLIDE (loo PROE lide) is a man-made protein that acts like a natural hormone in the body. It decreases testosterone in men and decreases estrogen in women. In men, this medicine is used to treat advanced prostate cancer. In women, some forms of this medicine may be used to treat endometriosis, uterine fibroids, or other male hormone-related problems. This medicine may be used for other purposes; ask your health care provider or pharmacist if you have questions. COMMON BRAND NAME(S): Eligard, Lupron Depot, Lupron Depot-Ped, Viadur What should I tell my health care provider before I take this medicine? They need to know if you have any of these conditions: -diabetes -heart disease or previous heart attack -high blood pressure -high cholesterol -mental illness -osteoporosis -pain or difficulty passing urine -seizures -spinal cord metastasis -stroke -suicidal thoughts, plans, or attempt; a previous suicide attempt by you or a family member -tobacco smoker -unusual vaginal bleeding (women) -an unusual or allergic reaction to leuprolide, benzyl alcohol, other medicines, foods, dyes, or preservatives -pregnant or trying to get pregnant -breast-feeding How should I use this medicine? This medicine is for injection into a muscle or for injection under the skin. It is given by a health care professional in a hospital or clinic setting. The specific product will determine how it will be given to you. Make sure you understand which product you receive and how often you will receive it. Talk to your pediatrician regarding the use of this medicine in children. Special care may be needed. Overdosage: If you think you have taken too much of this medicine contact a poison control center or emergency room at once. NOTE: This medicine is only for you. Do not share this medicine with others. What if I miss a dose? It is important not to miss a dose.  Call your doctor or health care professional if you are unable to keep an appointment. Depot injections: Depot injections are given either once-monthly, every 12 weeks, every 16 weeks, or every 24 weeks depending on the product you are prescribed. The product you are prescribed will be based on if you are male or male, and your condition. Make sure you understand your product and dosing. What may interact with this medicine? Do not take this medicine with any of the following medications: -chasteberry This medicine may also interact with the following medications: -herbal or dietary supplements, like black cohosh or DHEA -male hormones, like estrogens or progestins and birth control pills, patches, rings, or injections -male hormones, like testosterone This list may not describe all possible interactions. Give your health care provider a list of all the medicines, herbs, non-prescription drugs, or dietary supplements you use. Also tell them if you smoke, drink alcohol, or use illegal drugs. Some items may interact with your medicine. What should I watch for while using this medicine? Visit your doctor or health care professional for regular checks on your progress. During the first weeks of treatment, your symptoms may get worse, but then will improve as you continue your treatment. You may get hot flashes, increased bone pain, increased difficulty passing urine, or an aggravation of nerve symptoms. Discuss these effects with your doctor or health care professional, some of them may improve with continued use of this medicine. Male patients may experience a menstrual cycle or spotting during the first months of therapy with this medicine. If this continues, contact your doctor or health care professional. What side effects may I notice from receiving this medicine? Side   effects that you should report to your doctor or health care professional as soon as possible: -allergic reactions like skin  rash, itching or hives, swelling of the face, lips, or tongue -breathing problems -chest pain -depression or memory disorders -pain in your legs or groin -pain at site where injected or implanted -seizures -severe headache -swelling of the feet and legs -suicidal thoughts or other mood changes -visual changes -vomiting Side effects that usually do not require medical attention (report to your doctor or health care professional if they continue or are bothersome): -breast swelling or tenderness -decrease in sex drive or performance -diarrhea -hot flashes -loss of appetite -muscle, joint, or bone pains -nausea -redness or irritation at site where injected or implanted -skin problems or acne This list may not describe all possible side effects. Call your doctor for medical advice about side effects. You may report side effects to FDA at 1-800-FDA-1088. Where should I keep my medicine? This drug is given in a hospital or clinic and will not be stored at home. NOTE: This sheet is a summary. It may not cover all possible information. If you have questions about this medicine, talk to your doctor, pharmacist, or health care provider.  2018 Elsevier/Gold Standard (2016-05-19 09:45:53)  

## 2018-08-01 ENCOUNTER — Ambulatory Visit: Payer: BLUE CROSS/BLUE SHIELD | Admitting: Oncology

## 2018-08-01 ENCOUNTER — Telehealth: Payer: Self-pay | Admitting: *Deleted

## 2018-08-01 ENCOUNTER — Other Ambulatory Visit: Payer: BLUE CROSS/BLUE SHIELD

## 2018-08-01 LAB — PROSTATE-SPECIFIC AG, SERUM (LABCORP): Prostate Specific Ag, Serum: <0.1 ng/mL (ref 0.0–4.0)

## 2018-08-01 NOTE — Telephone Encounter (Signed)
-----   Message from Wyatt Portela, MD sent at 08/01/2018  7:53 AM EDT ----- Please let him know his PSA is low.

## 2018-08-01 NOTE — Telephone Encounter (Signed)
Spoke with patient. Gave results of last PSA. Faxed labs to PCP, dr saad Reesa Chew [phone] at [facility]

## 2018-08-02 DIAGNOSIS — E1142 Type 2 diabetes mellitus with diabetic polyneuropathy: Secondary | ICD-10-CM | POA: Diagnosis not present

## 2018-08-02 DIAGNOSIS — I10 Essential (primary) hypertension: Secondary | ICD-10-CM | POA: Diagnosis not present

## 2018-08-02 DIAGNOSIS — R29898 Other symptoms and signs involving the musculoskeletal system: Secondary | ICD-10-CM | POA: Diagnosis not present

## 2018-08-02 DIAGNOSIS — E038 Other specified hypothyroidism: Secondary | ICD-10-CM | POA: Diagnosis not present

## 2018-08-14 DIAGNOSIS — R262 Difficulty in walking, not elsewhere classified: Secondary | ICD-10-CM | POA: Diagnosis not present

## 2018-08-14 DIAGNOSIS — R29898 Other symptoms and signs involving the musculoskeletal system: Secondary | ICD-10-CM | POA: Diagnosis not present

## 2018-08-14 DIAGNOSIS — R531 Weakness: Secondary | ICD-10-CM | POA: Diagnosis not present

## 2018-08-14 DIAGNOSIS — R296 Repeated falls: Secondary | ICD-10-CM | POA: Diagnosis not present

## 2018-08-16 DIAGNOSIS — K7689 Other specified diseases of liver: Secondary | ICD-10-CM | POA: Diagnosis not present

## 2018-08-16 DIAGNOSIS — Z23 Encounter for immunization: Secondary | ICD-10-CM | POA: Diagnosis not present

## 2018-08-16 DIAGNOSIS — R600 Localized edema: Secondary | ICD-10-CM | POA: Diagnosis not present

## 2018-08-16 DIAGNOSIS — I10 Essential (primary) hypertension: Secondary | ICD-10-CM | POA: Diagnosis not present

## 2018-08-29 DIAGNOSIS — H4312 Vitreous hemorrhage, left eye: Secondary | ICD-10-CM | POA: Diagnosis not present

## 2018-08-31 DIAGNOSIS — E113513 Type 2 diabetes mellitus with proliferative diabetic retinopathy with macular edema, bilateral: Secondary | ICD-10-CM | POA: Diagnosis not present

## 2018-08-31 DIAGNOSIS — E1139 Type 2 diabetes mellitus with other diabetic ophthalmic complication: Secondary | ICD-10-CM | POA: Diagnosis not present

## 2018-08-31 DIAGNOSIS — E113511 Type 2 diabetes mellitus with proliferative diabetic retinopathy with macular edema, right eye: Secondary | ICD-10-CM | POA: Diagnosis not present

## 2018-09-04 DIAGNOSIS — E1139 Type 2 diabetes mellitus with other diabetic ophthalmic complication: Secondary | ICD-10-CM | POA: Diagnosis not present

## 2018-09-04 DIAGNOSIS — H4312 Vitreous hemorrhage, left eye: Secondary | ICD-10-CM | POA: Diagnosis not present

## 2018-09-05 DIAGNOSIS — Z09 Encounter for follow-up examination after completed treatment for conditions other than malignant neoplasm: Secondary | ICD-10-CM | POA: Diagnosis not present

## 2018-09-10 DIAGNOSIS — R739 Hyperglycemia, unspecified: Secondary | ICD-10-CM | POA: Diagnosis not present

## 2018-09-10 DIAGNOSIS — E119 Type 2 diabetes mellitus without complications: Secondary | ICD-10-CM | POA: Diagnosis not present

## 2018-09-12 DIAGNOSIS — Z09 Encounter for follow-up examination after completed treatment for conditions other than malignant neoplasm: Secondary | ICD-10-CM | POA: Diagnosis not present

## 2018-09-18 DIAGNOSIS — E113513 Type 2 diabetes mellitus with proliferative diabetic retinopathy with macular edema, bilateral: Secondary | ICD-10-CM | POA: Diagnosis not present

## 2018-09-18 DIAGNOSIS — H4312 Vitreous hemorrhage, left eye: Secondary | ICD-10-CM | POA: Diagnosis not present

## 2018-09-27 DIAGNOSIS — H4312 Vitreous hemorrhage, left eye: Secondary | ICD-10-CM | POA: Diagnosis not present

## 2018-10-02 ENCOUNTER — Telehealth: Payer: Self-pay | Admitting: Oncology

## 2018-10-02 ENCOUNTER — Inpatient Hospital Stay: Payer: BLUE CROSS/BLUE SHIELD | Attending: Radiation Oncology | Admitting: Oncology

## 2018-10-02 ENCOUNTER — Inpatient Hospital Stay: Payer: BLUE CROSS/BLUE SHIELD

## 2018-10-02 VITALS — HR 76 | Temp 97.9°F | Resp 20 | Ht 73.0 in | Wt 208.6 lb

## 2018-10-02 DIAGNOSIS — Z79899 Other long term (current) drug therapy: Secondary | ICD-10-CM | POA: Insufficient documentation

## 2018-10-02 DIAGNOSIS — I10 Essential (primary) hypertension: Secondary | ICD-10-CM

## 2018-10-02 DIAGNOSIS — E119 Type 2 diabetes mellitus without complications: Secondary | ICD-10-CM | POA: Diagnosis not present

## 2018-10-02 DIAGNOSIS — C61 Malignant neoplasm of prostate: Secondary | ICD-10-CM

## 2018-10-02 DIAGNOSIS — E876 Hypokalemia: Secondary | ICD-10-CM

## 2018-10-02 DIAGNOSIS — E291 Testicular hypofunction: Secondary | ICD-10-CM

## 2018-10-02 DIAGNOSIS — Z794 Long term (current) use of insulin: Secondary | ICD-10-CM | POA: Diagnosis not present

## 2018-10-02 LAB — CMP (CANCER CENTER ONLY)
ALT: 75 U/L — AB (ref 0–44)
AST: 61 U/L — AB (ref 15–41)
Albumin: 3.7 g/dL (ref 3.5–5.0)
Alkaline Phosphatase: 92 U/L (ref 38–126)
Anion gap: 8 (ref 5–15)
BILIRUBIN TOTAL: 0.6 mg/dL (ref 0.3–1.2)
BUN: 20 mg/dL (ref 6–20)
CO2: 25 mmol/L (ref 22–32)
Calcium: 10.1 mg/dL (ref 8.9–10.3)
Chloride: 108 mmol/L (ref 98–111)
Creatinine: 0.99 mg/dL (ref 0.61–1.24)
GFR, Est AFR Am: >60 mL/min (ref >60)
Glucose, Bld: 170 mg/dL — ABNORMAL HIGH (ref 70–99)
POTASSIUM: 3.5 mmol/L (ref 3.5–5.1)
Sodium: 141 mmol/L (ref 135–145)
TOTAL PROTEIN: 7.1 g/dL (ref 6.5–8.1)

## 2018-10-02 LAB — CBC WITH DIFFERENTIAL (CANCER CENTER ONLY)
ABS IMMATURE GRANULOCYTES: 0.01 10*3/uL (ref 0.00–0.07)
BASOS PCT: 1 %
Basophils Absolute: 0.1 10*3/uL (ref 0.0–0.1)
Eosinophils Absolute: 0.4 10*3/uL (ref 0.0–0.5)
Eosinophils Relative: 5 %
HCT: 37.3 % — ABNORMAL LOW (ref 39.0–52.0)
HEMOGLOBIN: 12.4 g/dL — AB (ref 13.0–17.0)
Immature Granulocytes: 0 %
LYMPHS PCT: 33 %
Lymphs Abs: 2.5 10*3/uL (ref 0.7–4.0)
MCH: 28.6 pg (ref 26.0–34.0)
MCHC: 33.2 g/dL (ref 30.0–36.0)
MCV: 86.1 fL (ref 80.0–100.0)
MONO ABS: 0.7 10*3/uL (ref 0.1–1.0)
Monocytes Relative: 10 %
NEUTROS ABS: 4 10*3/uL (ref 1.7–7.7)
Neutrophils Relative %: 51 %
Platelet Count: 155 10*3/uL (ref 150–400)
RBC: 4.33 MIL/uL (ref 4.22–5.81)
RDW: 13.2 % (ref 11.5–15.5)
WBC: 7.7 10*3/uL (ref 4.0–10.5)
nRBC: 0 % (ref 0.0–0.2)

## 2018-10-02 NOTE — Telephone Encounter (Signed)
Scheduled appt per 10/15 los- gave patient aVS and calender per los.   

## 2018-10-02 NOTE — Progress Notes (Signed)
Hematology and Oncology Follow Up Visit  Adam Cruz [id number] 11/02/1961 57 y.o. 10/02/2018 11:41 AM [name], MDSaeed, [name], MD   Principle Diagnosis: 57 year old man with castration-sensitive advanced prostate cancer with lymphadenopathy diagnosed in June 2018 with PSA of 66 and a Gleason score of 8.    Prior Therapy:  Androgen deprivation therapy started in July 2018.  Current therapy:  Lupron 30 mg every 4 months given on November 21, 2017.  His next injection is scheduled for April 2019.  Zytiga 1000 mg daily started in August 2018.  His dose was reduced to 750 mg daily started in March 2019.  Interim History: Adam Cruz returns today for a follow-up visit.  Since her last visit, he reports improvement in his overall health including decrease in his arthralgias or myalgias.  He did have acute loss of vision on the left eye related to hemorrhagic vascular issues and has been following with his ophthalmologist regarding this issue.  This is attributed to your long-term diabetes as well as a rise in his blood pressure.  He is currently following up with his primary care regarding titrating his blood pressure medication.  His blood sugars been reasonably controlled.  He continues to take Zytiga without any new complications.  His arthralgias and myalgias is improved at this time.  He denies any excessive fatigue or tiredness.  Is ambulating without any difficulties.  He denies any falls or syncope.  He does not report any headaches, syncope or seizures..  He denied alteration in mental status or confusion.  He does not report any fevers, chills, sweats.  He does not report any chest pain, palpitation, orthopnea.  He does not report any cough, wheezing, hemoptysis or dyspnea on exertion. He does not report any nausea, vomiting or abdominal pain.  Denies any changes in his bowels.  He denied any lymphadenopathy, petechiae.  He denies any anxiety or depression.   He does not  report any adenopathy or petechiae.  He denies any skin rashes or lesions. Remaining review of systems is negative.  Medications: I have reviewed the patient's current medications.  Current Outpatient Medications  Medication Sig Dispense Refill  . abiraterone acetate (ZYTIGA) 250 MG tablet Take 3 tablets (750 mg total) by mouth daily. Take on an empty stomach 1 hour before or 2 hours after a meal 120 tablet 3  . abiraterone acetate (ZYTIGA) 250 MG tablet TAKE 4 TABLETS BY MOUTH EVERY DAY TAKE ON AN EMPTY STOMACH 1 HOUR BEFORE OR 2 HOURS AFTER A MEAL 120 tablet 0  . amitriptyline (ELAVIL) 25 MG tablet     . ASPIRIN 81 PO TK 1 T PO QD  6  . carvedilol (COREG) 6.25 MG tablet   4  . Cholecalciferol (VITAMIN D3) 5000 units CAPS TAKE 1 BY MOUTH DAILY    . Gabapentin Enacarbil (HORIZANT) 600 MG TBCR TK 1 T PO BID    . glimepiride (AMARYL) 4 MG tablet TK 1 T PO QD  2  . GLIPIZIDE PO Take by mouth.    . Insulin Glargine-Lixisenatide 100-33 UNT-MCG/ML SOPN Inject into the skin.    Marland Kitchen JARDIANCE 25 MG TABS tablet TK 1 T PO IN THE MORNING  4  . levothyroxine (SYNTHROID, LEVOTHROID) 125 MCG tablet     . lisinopril (PRINIVIL,ZESTRIL) 20 MG tablet TK 1 T PO D  6  . metoprolol succinate (TOPROL-XL) 25 MG 24 hr tablet   3  . omeprazole (PRILOSEC) 40 MG capsule TAKE 1 CAPSULE BY MOUTH  ONCE DAILY BEFORE A MEAL IN COMBINATION WITH CLARITHROMYCIN    . potassium chloride SA (K-DUR,KLOR-CON) 20 MEQ tablet Take 1 tablet (20 mEq total) by mouth daily. 30 tablet 0  . rosuvastatin (CRESTOR) 40 MG tablet     . ticagrelor (BRILINTA) 90 MG TABS tablet Take by mouth.     No current facility-administered medications for this visit.      Allergies: No Known Allergies  Past Medical History, Surgical history, Social history, and Family History are unchanged.  He denies any home or tobacco use.  Physical Exam:  Pulse 76, temperature 97.9 F (36.6 C), temperature source Oral, resp. rate 20, height 6\' 1"  (1.854 m),  weight 208 lb 9.6 oz (94.6 kg), SpO2 99 %.    ECOG: 1   General appearance: Comfortable appearing without any discomfort Head: Normocephalic without any trauma Oropharynx: Mucous membranes are moist and pink without any thrush or ulcers. Eyes: Pupils are equal and round reactive to light. Lymph nodes: No cervical, supraclavicular, inguinal or axillary lymphadenopathy.   Heart:regular rate and rhythm.  S1 and S2 without leg edema. Lung: Clear without any rhonchi or wheezes.  No dullness to percussion. Abdomin: Soft, nontender, nondistended with good bowel sounds.  No hepatosplenomegaly. Musculoskeletal: No joint deformity or effusion.  Full range of motion noted. Neurological: No deficits noted on motor, sensory and deep tendon reflex exam. Skin: No petechial rash or dryness.  Appeared moist.     Lab Results: Lab Results  Component Value Date   WBC 9.0 07/31/2018   HGB 12.8 (L) 07/31/2018   HCT 37.9 (L) 07/31/2018   MCV 84.0 07/31/2018   PLT 173 07/31/2018     Chemistry      Component Value Date/Time   NA 144 07/31/2018 0914   NA 140 11/21/2017 1444   K 2.6 (LL) 07/31/2018 0914   K 3.6 11/21/2017 1444   CL 106 07/31/2018 0914   CO2 25 07/31/2018 0914   CO2 25 11/21/2017 1444   BUN 14 07/31/2018 0914   BUN 14.2 11/21/2017 1444   CREATININE 0.94 07/31/2018 0914   CREATININE 1.1 11/21/2017 1444      Component Value Date/Time   CALCIUM 9.3 07/31/2018 0914   CALCIUM 9.4 11/21/2017 1444   ALKPHOS 94 07/31/2018 0914   ALKPHOS 119 11/21/2017 1444   AST 73 (H) 07/31/2018 0914   AST 29 11/21/2017 1444   ALT 82 (H) 07/31/2018 0914   ALT 32 11/21/2017 1444   BILITOT 0.7 07/31/2018 0914   BILITOT 0.40 11/21/2017 1444       Results for Adam Cruz, Adam Cruz (MRN [id number]) as of 10/02/2018 11:44  Ref. Range 06/06/2018 11:30 07/31/2018 09:14  Prostate Specific Ag, Serum Latest Ref Range: 0.0 - 4.0 ng/mL <0.1 <0.1      Impression and Plan:  57 year old man with:   1.   Castration-sensitive prostate cancer diagnosed in June 2018.  He presented withlymphadenopathy and PSA of 66.      He is currently on Zytiga 750 mg daily with excellent PSA response.  Risks and benefits of continuing this therapy long-term was discussed today.  Long-term complications were also reviewed.  Metabolic derangements associated with this medication was also reviewed and he is agreeable to continue at this time.  2. Androgen deprivation therapy: He remains on Lupron every 4 months and his next injection scheduled for December 2019.  3.  Hypokalemia: His potassium has been low and currently on oral replacement.  4.  Diabetes: Followed by his primary  care physician and blood sugar appears manageable.  5.  Liver function test surveillance: Related to Zytiga although has not changed dramatically.  We will continue to monitor it periodically.  6.  Hypertension: His blood pressure is within normal range at this time.  Continues to monitored periodically.  7.  Arthralgias and muscle weakness: He has mild elevation in CPK and CRP which are nonspecific.  Does not support the findings for autoimmune etiology however.  His symptoms has improved at this time.  8. Follow-up: Will be in 8 weeks.   25 minutes was spent with the patient face-to-face today.  More than 50% of time was dedicated to reviewing his disease status, treatment options and complications related to therapy.  Zola Button, MD 10/15/201911:41 AM

## 2018-10-03 ENCOUNTER — Telehealth: Payer: Self-pay

## 2018-10-03 LAB — PROSTATE-SPECIFIC AG, SERUM (LABCORP)

## 2018-10-03 NOTE — Telephone Encounter (Signed)
-----   Message from Wyatt Portela, MD sent at 10/03/2018  8:28 AM EDT ----- Please let him know his PSA is low

## 2018-10-03 NOTE — Telephone Encounter (Signed)
Spoke with patient and made aware of results. No other questions or concerns.

## 2018-10-04 DIAGNOSIS — H4312 Vitreous hemorrhage, left eye: Secondary | ICD-10-CM | POA: Diagnosis not present

## 2018-10-10 DIAGNOSIS — Z0000 Encounter for general adult medical examination without abnormal findings: Secondary | ICD-10-CM | POA: Diagnosis not present

## 2018-10-10 DIAGNOSIS — I10 Essential (primary) hypertension: Secondary | ICD-10-CM | POA: Diagnosis not present

## 2018-10-10 DIAGNOSIS — Z23 Encounter for immunization: Secondary | ICD-10-CM | POA: Diagnosis not present

## 2018-10-10 DIAGNOSIS — I2510 Atherosclerotic heart disease of native coronary artery without angina pectoris: Secondary | ICD-10-CM | POA: Diagnosis not present

## 2018-10-10 DIAGNOSIS — E1140 Type 2 diabetes mellitus with diabetic neuropathy, unspecified: Secondary | ICD-10-CM | POA: Diagnosis not present

## 2018-10-10 DIAGNOSIS — Z1331 Encounter for screening for depression: Secondary | ICD-10-CM | POA: Diagnosis not present

## 2018-10-11 DIAGNOSIS — Z0000 Encounter for general adult medical examination without abnormal findings: Secondary | ICD-10-CM | POA: Diagnosis not present

## 2018-10-23 DIAGNOSIS — E119 Type 2 diabetes mellitus without complications: Secondary | ICD-10-CM | POA: Diagnosis not present

## 2018-10-23 DIAGNOSIS — Z794 Long term (current) use of insulin: Secondary | ICD-10-CM | POA: Diagnosis not present

## 2018-10-25 DIAGNOSIS — H4312 Vitreous hemorrhage, left eye: Secondary | ICD-10-CM | POA: Diagnosis not present

## 2018-11-03 ENCOUNTER — Other Ambulatory Visit: Payer: Self-pay | Admitting: Oncology

## 2018-11-27 ENCOUNTER — Encounter (HOSPITAL_COMMUNITY): Payer: Self-pay

## 2018-11-27 ENCOUNTER — Telehealth: Payer: Self-pay | Admitting: *Deleted

## 2018-11-27 ENCOUNTER — Inpatient Hospital Stay: Payer: BLUE CROSS/BLUE SHIELD | Attending: Radiation Oncology

## 2018-11-27 ENCOUNTER — Ambulatory Visit (HOSPITAL_COMMUNITY)
Admission: RE | Admit: 2018-11-27 | Discharge: 2018-11-27 | Disposition: A | Discharge status: Home / Self Care | Payer: BLUE CROSS/BLUE SHIELD | Source: Ambulatory Visit | Attending: Oncology | Admitting: Oncology

## 2018-11-27 ENCOUNTER — Telehealth: Payer: Self-pay

## 2018-11-27 DIAGNOSIS — E119 Type 2 diabetes mellitus without complications: Secondary | ICD-10-CM | POA: Insufficient documentation

## 2018-11-27 DIAGNOSIS — Z8546 Personal history of malignant neoplasm of prostate: Secondary | ICD-10-CM | POA: Diagnosis not present

## 2018-11-27 DIAGNOSIS — C61 Malignant neoplasm of prostate: Secondary | ICD-10-CM | POA: Insufficient documentation

## 2018-11-27 DIAGNOSIS — Z794 Long term (current) use of insulin: Secondary | ICD-10-CM | POA: Insufficient documentation

## 2018-11-27 DIAGNOSIS — Z5111 Encounter for antineoplastic chemotherapy: Secondary | ICD-10-CM | POA: Diagnosis not present

## 2018-11-27 DIAGNOSIS — R740 Nonspecific elevation of levels of transaminase and lactic acid dehydrogenase [LDH]: Secondary | ICD-10-CM | POA: Diagnosis not present

## 2018-11-27 DIAGNOSIS — R609 Edema, unspecified: Secondary | ICD-10-CM | POA: Diagnosis not present

## 2018-11-27 DIAGNOSIS — I10 Essential (primary) hypertension: Secondary | ICD-10-CM | POA: Insufficient documentation

## 2018-11-27 DIAGNOSIS — E291 Testicular hypofunction: Secondary | ICD-10-CM | POA: Insufficient documentation

## 2018-11-27 DIAGNOSIS — E876 Hypokalemia: Secondary | ICD-10-CM | POA: Insufficient documentation

## 2018-11-27 DIAGNOSIS — Z79899 Other long term (current) drug therapy: Secondary | ICD-10-CM | POA: Insufficient documentation

## 2018-11-27 LAB — CBC WITH DIFFERENTIAL/PLATELET
Abs Immature Granulocytes: 0.02 10*3/uL (ref 0.00–0.07)
Basophils Absolute: 0.1 10*3/uL (ref 0.0–0.1)
Basophils Relative: 1 %
EOS ABS: 0.8 10*3/uL — AB (ref 0.0–0.5)
EOS PCT: 9 %
HEMATOCRIT: 37.1 % — AB (ref 39.0–52.0)
HEMOGLOBIN: 12.3 g/dL — AB (ref 13.0–17.0)
IMMATURE GRANULOCYTES: 0 %
LYMPHS ABS: 2.3 10*3/uL (ref 0.7–4.0)
Lymphocytes Relative: 28 %
MCH: 28.5 pg (ref 26.0–34.0)
MCHC: 33.2 g/dL (ref 30.0–36.0)
MCV: 86.1 fL (ref 80.0–100.0)
MONOS PCT: 9 %
Monocytes Absolute: 0.8 10*3/uL (ref 0.1–1.0)
NEUTROS PCT: 53 %
Neutro Abs: 4.5 10*3/uL (ref 1.7–7.7)
Platelets: 166 10*3/uL (ref 150–400)
RBC: 4.31 MIL/uL (ref 4.22–5.81)
RDW: 13.4 % (ref 11.5–15.5)
WBC: 8.4 10*3/uL (ref 4.0–10.5)
nRBC: 0 % (ref 0.0–0.2)

## 2018-11-27 LAB — COMPREHENSIVE METABOLIC PANEL
ALBUMIN: 3.7 g/dL (ref 3.5–5.0)
ALT: 66 U/L — AB (ref 0–44)
AST: 65 U/L — AB (ref 15–41)
Alkaline Phosphatase: 114 U/L (ref 38–126)
Anion gap: 11 (ref 5–15)
BUN: 15 mg/dL (ref 6–20)
CHLORIDE: 108 mmol/L (ref 98–111)
CO2: 24 mmol/L (ref 22–32)
CREATININE: 1.08 mg/dL (ref 0.61–1.24)
Calcium: 9.4 mg/dL (ref 8.9–10.3)
GFR calc Af Amer: >60 mL/min (ref >60)
GLUCOSE: 160 mg/dL — AB (ref 70–99)
Potassium: 2.7 mmol/L — CL (ref 3.5–5.1)
Sodium: 143 mmol/L (ref 135–145)
Total Bilirubin: 0.7 mg/dL (ref 0.3–1.2)
Total Protein: 7.1 g/dL (ref 6.5–8.1)

## 2018-11-27 MED ORDER — SODIUM CHLORIDE (PF) 0.9 % IJ SOLN
INTRAMUSCULAR | Status: AC
  Filled 2018-11-27: qty 50

## 2018-11-27 MED ORDER — IOHEXOL 300 MG/ML  SOLN
100.0000 mL | Freq: Once | INTRAMUSCULAR | Status: AC | PRN
  Administered 2018-11-27: 100 mL via INTRAVENOUS

## 2018-11-27 NOTE — Telephone Encounter (Signed)
Received notification from triage that patient potassium resulted at 2.7. Per Dr. [name] instruction left message for patient with potassium results and to start taking 2 potassium tablets daily by mouth for total of 40 MeQ daily, if he has any questions or concerns to call back at [phone].

## 2018-11-27 NOTE — Telephone Encounter (Signed)
TCT received from the lab with critical result of K+ 2.7. Pt is here for labs and then CT scan @ 2pm. VM message left for Dr. Hazeline Junker nurse as well as this telephone note sent with high priority.

## 2018-11-27 NOTE — Telephone Encounter (Signed)
-----   Message from Wyatt Portela, MD sent at 11/27/2018  3:07 PM EST ----- Regarding: RE: K 2.7 today Please let him know to take two a day.  ----- Message ----- From: Scot Dock, RN Sent: 11/27/2018   2:53 PM EST To: Wyatt Portela, MD Subject: K 2.7 today                                    He is taking 20 MeQ daily po. Please advise

## 2018-11-28 ENCOUNTER — Inpatient Hospital Stay: Payer: BLUE CROSS/BLUE SHIELD

## 2018-11-28 ENCOUNTER — Telehealth: Payer: Self-pay

## 2018-11-28 ENCOUNTER — Encounter: Payer: Self-pay | Admitting: Oncology

## 2018-11-28 ENCOUNTER — Inpatient Hospital Stay (HOSPITAL_BASED_OUTPATIENT_CLINIC_OR_DEPARTMENT_OTHER): Payer: BLUE CROSS/BLUE SHIELD | Admitting: Oncology

## 2018-11-28 VITALS — BP 145/76 | HR 77 | Temp 98.0°F | Resp 17 | Ht 73.0 in | Wt 217.4 lb

## 2018-11-28 DIAGNOSIS — C61 Malignant neoplasm of prostate: Secondary | ICD-10-CM

## 2018-11-28 DIAGNOSIS — R609 Edema, unspecified: Secondary | ICD-10-CM | POA: Diagnosis not present

## 2018-11-28 DIAGNOSIS — E119 Type 2 diabetes mellitus without complications: Secondary | ICD-10-CM

## 2018-11-28 DIAGNOSIS — I10 Essential (primary) hypertension: Secondary | ICD-10-CM

## 2018-11-28 DIAGNOSIS — E876 Hypokalemia: Secondary | ICD-10-CM | POA: Diagnosis not present

## 2018-11-28 DIAGNOSIS — E291 Testicular hypofunction: Secondary | ICD-10-CM

## 2018-11-28 DIAGNOSIS — Z79899 Other long term (current) drug therapy: Secondary | ICD-10-CM | POA: Diagnosis not present

## 2018-11-28 DIAGNOSIS — Z794 Long term (current) use of insulin: Secondary | ICD-10-CM | POA: Diagnosis not present

## 2018-11-28 DIAGNOSIS — Z5111 Encounter for antineoplastic chemotherapy: Secondary | ICD-10-CM | POA: Diagnosis not present

## 2018-11-28 DIAGNOSIS — R740 Nonspecific elevation of levels of transaminase and lactic acid dehydrogenase [LDH]: Secondary | ICD-10-CM | POA: Diagnosis not present

## 2018-11-28 LAB — PROSTATE-SPECIFIC AG, SERUM (LABCORP): Prostate Specific Ag, Serum: <0.1 ng/mL (ref 0.0–4.0)

## 2018-11-28 MED ORDER — LEUPROLIDE ACETATE (4 MONTH) 30 MG IM KIT
30.0000 mg | PACK | Freq: Once | INTRAMUSCULAR | Status: AC
  Administered 2018-11-28: 30 mg via INTRAMUSCULAR
  Filled 2018-11-28: qty 30

## 2018-11-28 NOTE — Patient Instructions (Signed)

## 2018-11-28 NOTE — Telephone Encounter (Signed)
Printed avs and calender of upcoming appointment. Per 12/11 los 

## 2018-11-28 NOTE — Progress Notes (Signed)
Hematology and Oncology Follow Up Visit  Adam Cruz [id number] [birth year] 57 y.o. 11/28/2018 12:10 PM Adam Cruz, MDSaeed, Adam Leo, MD   Principle Diagnosis: 57 year old man with advanced prostate cancer diagnosed in June 2018.  He presented with castration-sensitive disease and lymphadenopathy with PSA of 66 and a Gleason score of 8.    Prior Therapy:  Androgen deprivation therapy started in July 2018.  Current therapy:  Lupron 30 mg every 4 months given on November 21, 2017.  His next injection is scheduled for April 2019.  Zytiga 1000 mg daily started in August 2018.  His dose was reduced to 750 mg daily started in March 2019.  Interim History: Adam Cruz returns today for a repeat evaluation.  Since the last visit, he reports no major changes in his health.  He continues to tolerate Zytiga at the current dose without any new side effects.  He denies any arthralgias or myalgias, bone pain or recent falls.  He does report lower extremity edema that has been chronic and unchanged.  He reports his appetite is been excellent and has gained weight.  He denies any hematuria or dysuria has performance status and quality of life remained maintained.  He is ambulating without help of a walker or cane.  He does not report any headaches, syncope or seizures..  He denied dizziness or alteration in mentation.  He does not report any fevers, chills, sweats.  He does not report any chest pain, palpitation, orthopnea.  He does not report any cough, wheezing, hemoptysis or dyspnea on exertion. He does not report any nausea, vomiting or early satiety.  Denies any depression or diarrhea.  He denied any bleeding or clotting tendency.  He denies any mood changes.  He does not report any skin rashes or ecchymosis or petechiae.  Denies any heat or cold intolerance.  Remaining review of systems is negative.  Medications: I have reviewed the patient's current medications.  Current Outpatient Medications   Medication Sig Dispense Refill  . abiraterone acetate (ZYTIGA) 250 MG tablet Take 3 tablets (750 mg total) by mouth daily. Take on an empty stomach 1 hour before or 2 hours after a meal 120 tablet 3  . abiraterone acetate (ZYTIGA) 250 MG tablet TAKE 4 TABLETS BY MOUTH EVERY DAY TAKE ON AN EMPTY STOMACH 1 HOUR BEFORE OR 2 HOURS AFTER A MEAL 120 tablet 0  . amitriptyline (ELAVIL) 25 MG tablet     . ASPIRIN 81 PO TK 1 T PO QD  6  . carvedilol (COREG) 6.25 MG tablet   4  . Cholecalciferol (VITAMIN D3) 5000 units CAPS TAKE 1 BY MOUTH DAILY    . Gabapentin Enacarbil (HORIZANT) 600 MG TBCR TK 1 T PO BID    . glimepiride (AMARYL) 4 MG tablet TK 1 T PO QD  2  . GLIPIZIDE PO Take by mouth.    . Insulin Glargine-Lixisenatide 100-33 UNT-MCG/ML SOPN Inject into the skin.    Marland Kitchen JARDIANCE 25 MG TABS tablet TK 1 T PO IN THE MORNING  4  . levothyroxine (SYNTHROID, LEVOTHROID) 125 MCG tablet     . lisinopril (PRINIVIL,ZESTRIL) 20 MG tablet TK 1 T PO D  6  . metoprolol succinate (TOPROL-XL) 25 MG 24 hr tablet   3  . omeprazole (PRILOSEC) 40 MG capsule TAKE 1 CAPSULE BY MOUTH ONCE DAILY BEFORE A MEAL IN COMBINATION WITH CLARITHROMYCIN    . potassium chloride SA (K-DUR,KLOR-CON) 20 MEQ tablet Take 1 tablet (20 mEq total) by mouth daily.  30 tablet 0  . rosuvastatin (CRESTOR) 40 MG tablet     . ticagrelor (BRILINTA) 90 MG TABS tablet Take by mouth.    Marland Kitchen ZYTIGA 250 MG tablet TAKE 3 TABLETS BY MOUTH DAILY.  TAKE ON AN EMPTY STOMACH 1 HOUR BEFORE OR 2 HOURS AFTER A MEAL. 90 tablet 0   No current facility-administered medications for this visit.      Allergies: No Known Allergies  Past Medical History, Surgical history, Social history, and Family History are unchanged.  He denies any home or tobacco use.  Physical Exam:  Blood pressure (!) 145/76, pulse 77, temperature 98 F (36.7 C), temperature source Oral, resp. rate 17, height 6\' 1"  (1.854 m), weight 217 lb 6.4 oz (98.6 kg), SpO2 100 %.    ECOG: 1     General appearance: Alert, awake without any distress. Head: Atraumatic without abnormalities Oropharynx: Without any thrush or ulcers. Eyes: No scleral icterus. Lymph nodes: No lymphadenopathy noted in the cervical, supraclavicular, or axillary nodes Heart:regular rate and rhythm, without any murmurs or gallops.   Lung: Clear to auscultation without any rhonchi, wheezes or dullness to percussion. Abdomin: Soft, nontender without any shifting dullness or ascites. Musculoskeletal: No clubbing or cyanosis. Neurological: No motor or sensory deficits. Skin: No rashes or lesions. Psychiatric: Mood and affect appeared normal.  General appearance: Comfortable appearing without any discomfort Head: Normocephalic without any trauma Oropharynx: Mucous membranes are moist and pink without any thrush or ulcers. Eyes: Pupils are equal and round reactive to light. Lymph nodes: No cervical, supraclavicular, inguinal or axillary lymphadenopathy.   Heart:regular rate and rhythm.  S1 and S2 .  1+ edema at the ankle bilaterally. Lung: Clear without any rhonchi or wheezes.  No dullness to percussion. Abdomin: Soft, nontender, nondistended with good bowel sounds.  No hepatosplenomegaly. Musculoskeletal: No joint deformity or effusion.  Full range of motion noted. Neurological: No deficits noted on motor, sensory and deep tendon reflex exam. Skin: No petechial rash or dryness.  Appeared moist.     Lab Results: Lab Results  Component Value Date   WBC 8.4 11/27/2018   HGB 12.3 (L) 11/27/2018   HCT 37.1 (L) 11/27/2018   MCV 86.1 11/27/2018   PLT 166 11/27/2018     Chemistry      Component Value Date/Time   NA 143 11/27/2018 1255   NA 140 11/21/2017 1444   K 2.7 (LL) 11/27/2018 1255   K 3.6 11/21/2017 1444   CL 108 11/27/2018 1255   CO2 24 11/27/2018 1255   CO2 25 11/21/2017 1444   BUN 15 11/27/2018 1255   BUN 14.2 11/21/2017 1444   CREATININE 1.08 11/27/2018 1255   CREATININE 0.99  10/02/2018 1131   CREATININE 1.1 11/21/2017 1444      Component Value Date/Time   CALCIUM 9.4 11/27/2018 1255   CALCIUM 9.4 11/21/2017 1444   ALKPHOS 114 11/27/2018 1255   ALKPHOS 119 11/21/2017 1444   AST 65 (H) 11/27/2018 1255   AST 61 (H) 10/02/2018 1131   AST 29 11/21/2017 1444   ALT 66 (H) 11/27/2018 1255   ALT 75 (H) 10/02/2018 1131   ALT 32 11/21/2017 1444   BILITOT 0.7 11/27/2018 1255   BILITOT 0.6 10/02/2018 1131   BILITOT 0.40 11/21/2017 1444      Results for ZAKIAH, BECKERMAN (MRN [id number]) as of 11/28/2018 11:52  Ref. Range 10/02/2018 11:31 11/27/2018 12:55  Prostate Specific Ag, Serum Latest Ref Range: 0.0 - 4.0 ng/mL <0.1 <0.1  EXAM: CT ABDOMEN AND PELVIS WITH CONTRAST  TECHNIQUE: Multidetector CT imaging of the abdomen and pelvis was performed using the standard protocol following bolus administration of intravenous contrast.  CONTRAST:  186mL OMNIPAQUE IOHEXOL 300 MG/ML  SOLN  COMPARISON:  CT 510 19, bone scan 04/27/2018  FINDINGS: Lower chest: Lung bases are clear.  Hepatobiliary: No focal hepatic lesion. No biliary duct dilatation. Gallbladder is normal. Common bile duct is normal.  Pancreas: Pancreas is normal. No ductal dilatation. No pancreatic inflammation.  Spleen: Normal spleen  Adrenals/urinary tract: Adrenal glands and kidneys are normal. The ureters and bladder are normal.  Stomach/Bowel: Stomach, small-bowel cecum normal. Post appendectomy. The colon and rectosigmoid colon are normal.  Vascular/Lymphatic: Abdominal aorta normal caliber. Small subcentimeter periaortic lymph nodes noted.  LEFT external iliac lymph node measuring 9 mm short axis (image 80/2) is not changed from 10 mm. Smaller LEFT external iliac lymph node measures 8 mm also unchanged. No new adenopathy.  Reproductive: Prostate small.  No periprosthetic adenopathy.  Other: No free fluid.  Musculoskeletal: Sclerotic lesion in the posterior LEFT  seventh rib measuring 10 mm (image 1/2 is unchanged. No new lesions in the visualized skeleton  IMPRESSION: 1. No evidence of prostate cancer recurrence. 2. Stable borderline enlarged external iliac lymph nodes. 3. Stable small sclerotic lesion the posterior LEFT tenth rib.    Impression and Plan:  57 year old man with:   1.  Advanced prostate cancer with lymphadenopathy diagnosed in June 2018.  He was found to have castration-sensitive disease with PSA of 66.      He remains on Zytiga without any major complications at this time.  He has tolerated the current dose much better at this time.  His PSA continues to be undetectable and CT scan obtained on 11/27/2018 showed no evidence of measurable disease.  Risks and benefits of continuing Zytiga versus switching to different therapies such as Xtandi or systemic chemotherapy or androgen deprivation therapy alone was reviewed.  After discussion is agreeable to continue.  2. Androgen deprivation therapy: I recommended continuing Lupron indefinitely.  Complications associated with this therapy including osteoporosis, weight gain and sexual dysfunction.  Is agreeable to continue.  3.  Hypokalemia: He reports he has not been taking his potassium regularly.  His potassium is low and has restarted it.  4.  Diabetes: No recent exacerbation at this time.  His sugar followed by his primary care physician.  5.  Liver function test surveillance: ALT elevation noted related to Zytiga.  No dose reduction at this time as needed.  6.  Hypertension: Blood pressure is reasonably controlled at this time.  7.  Arthralgias and muscle weakness: Improved with Zytiga dose reduction.  8. Follow-up: Will be in 2 months..   25 minutes was spent with the patient face-to-face today.  More than 50% of time was dedicated to updating the natural course of his disease, reviewing imaging studies and discussing alternative therapy options.  Zola Button,  MD 12/11/201912:10 PM

## 2018-11-28 NOTE — Addendum Note (Signed)
Addended by: Scot Dock on: 11/28/2018 12:54 PM   Modules accepted: Orders

## 2018-12-12 ENCOUNTER — Other Ambulatory Visit: Payer: Self-pay | Admitting: Oncology

## 2018-12-25 DIAGNOSIS — E119 Type 2 diabetes mellitus without complications: Secondary | ICD-10-CM | POA: Diagnosis not present

## 2018-12-25 DIAGNOSIS — H25812 Combined forms of age-related cataract, left eye: Secondary | ICD-10-CM | POA: Diagnosis not present

## 2018-12-25 DIAGNOSIS — Z01818 Encounter for other preprocedural examination: Secondary | ICD-10-CM | POA: Diagnosis not present

## 2018-12-31 DIAGNOSIS — H4311 Vitreous hemorrhage, right eye: Secondary | ICD-10-CM | POA: Diagnosis not present

## 2019-01-02 DIAGNOSIS — E113512 Type 2 diabetes mellitus with proliferative diabetic retinopathy with macular edema, left eye: Secondary | ICD-10-CM | POA: Diagnosis not present

## 2019-01-02 DIAGNOSIS — E113513 Type 2 diabetes mellitus with proliferative diabetic retinopathy with macular edema, bilateral: Secondary | ICD-10-CM | POA: Diagnosis not present

## 2019-01-02 DIAGNOSIS — H4311 Vitreous hemorrhage, right eye: Secondary | ICD-10-CM | POA: Diagnosis not present

## 2019-01-04 ENCOUNTER — Other Ambulatory Visit: Payer: Self-pay | Admitting: Oncology

## 2019-01-07 DIAGNOSIS — E113511 Type 2 diabetes mellitus with proliferative diabetic retinopathy with macular edema, right eye: Secondary | ICD-10-CM | POA: Diagnosis not present

## 2019-01-15 DIAGNOSIS — Z955 Presence of coronary angioplasty implant and graft: Secondary | ICD-10-CM | POA: Diagnosis not present

## 2019-01-15 DIAGNOSIS — E113512 Type 2 diabetes mellitus with proliferative diabetic retinopathy with macular edema, left eye: Secondary | ICD-10-CM | POA: Diagnosis not present

## 2019-01-15 DIAGNOSIS — H25812 Combined forms of age-related cataract, left eye: Secondary | ICD-10-CM | POA: Diagnosis not present

## 2019-01-15 DIAGNOSIS — H259 Unspecified age-related cataract: Secondary | ICD-10-CM | POA: Diagnosis not present

## 2019-01-15 DIAGNOSIS — Z7982 Long term (current) use of aspirin: Secondary | ICD-10-CM | POA: Diagnosis not present

## 2019-01-15 DIAGNOSIS — I10 Essential (primary) hypertension: Secondary | ICD-10-CM | POA: Diagnosis not present

## 2019-01-15 DIAGNOSIS — Z8546 Personal history of malignant neoplasm of prostate: Secondary | ICD-10-CM | POA: Diagnosis not present

## 2019-01-15 DIAGNOSIS — Z79899 Other long term (current) drug therapy: Secondary | ICD-10-CM | POA: Diagnosis not present

## 2019-01-15 DIAGNOSIS — E1136 Type 2 diabetes mellitus with diabetic cataract: Secondary | ICD-10-CM | POA: Diagnosis not present

## 2019-01-15 DIAGNOSIS — G4733 Obstructive sleep apnea (adult) (pediatric): Secondary | ICD-10-CM | POA: Diagnosis not present

## 2019-01-15 DIAGNOSIS — I2510 Atherosclerotic heart disease of native coronary artery without angina pectoris: Secondary | ICD-10-CM | POA: Diagnosis not present

## 2019-01-15 DIAGNOSIS — E039 Hypothyroidism, unspecified: Secondary | ICD-10-CM | POA: Diagnosis not present

## 2019-01-15 DIAGNOSIS — E785 Hyperlipidemia, unspecified: Secondary | ICD-10-CM | POA: Diagnosis not present

## 2019-01-15 DIAGNOSIS — E1140 Type 2 diabetes mellitus with diabetic neuropathy, unspecified: Secondary | ICD-10-CM | POA: Diagnosis not present

## 2019-01-15 DIAGNOSIS — Z794 Long term (current) use of insulin: Secondary | ICD-10-CM | POA: Diagnosis not present

## 2019-01-15 DIAGNOSIS — I252 Old myocardial infarction: Secondary | ICD-10-CM | POA: Diagnosis not present

## 2019-01-17 DIAGNOSIS — E113513 Type 2 diabetes mellitus with proliferative diabetic retinopathy with macular edema, bilateral: Secondary | ICD-10-CM | POA: Diagnosis not present

## 2019-01-18 DIAGNOSIS — I10 Essential (primary) hypertension: Secondary | ICD-10-CM | POA: Diagnosis not present

## 2019-01-18 DIAGNOSIS — E1142 Type 2 diabetes mellitus with diabetic polyneuropathy: Secondary | ICD-10-CM | POA: Diagnosis not present

## 2019-01-18 DIAGNOSIS — E039 Hypothyroidism, unspecified: Secondary | ICD-10-CM | POA: Diagnosis not present

## 2019-01-18 DIAGNOSIS — R29898 Other symptoms and signs involving the musculoskeletal system: Secondary | ICD-10-CM | POA: Diagnosis not present

## 2019-01-29 ENCOUNTER — Inpatient Hospital Stay: Payer: BLUE CROSS/BLUE SHIELD | Attending: Radiation Oncology | Admitting: Oncology

## 2019-01-29 ENCOUNTER — Inpatient Hospital Stay: Payer: BLUE CROSS/BLUE SHIELD

## 2019-01-29 ENCOUNTER — Telehealth: Payer: Self-pay | Admitting: Oncology

## 2019-01-29 VITALS — BP 158/92 | HR 70 | Temp 97.8°F | Resp 18 | Ht 73.0 in | Wt 212.8 lb

## 2019-01-29 DIAGNOSIS — R7989 Other specified abnormal findings of blood chemistry: Secondary | ICD-10-CM | POA: Diagnosis not present

## 2019-01-29 DIAGNOSIS — R531 Weakness: Secondary | ICD-10-CM

## 2019-01-29 DIAGNOSIS — C61 Malignant neoplasm of prostate: Secondary | ICD-10-CM

## 2019-01-29 DIAGNOSIS — E119 Type 2 diabetes mellitus without complications: Secondary | ICD-10-CM

## 2019-01-29 DIAGNOSIS — M2550 Pain in unspecified joint: Secondary | ICD-10-CM

## 2019-01-29 DIAGNOSIS — Z794 Long term (current) use of insulin: Secondary | ICD-10-CM | POA: Insufficient documentation

## 2019-01-29 DIAGNOSIS — I10 Essential (primary) hypertension: Secondary | ICD-10-CM | POA: Diagnosis not present

## 2019-01-29 DIAGNOSIS — E876 Hypokalemia: Secondary | ICD-10-CM

## 2019-01-29 DIAGNOSIS — E291 Testicular hypofunction: Secondary | ICD-10-CM

## 2019-01-29 LAB — COMPREHENSIVE METABOLIC PANEL
ALT: 58 U/L — ABNORMAL HIGH (ref 0–44)
AST: 60 U/L — ABNORMAL HIGH (ref 15–41)
Albumin: 3.7 g/dL (ref 3.5–5.0)
Alkaline Phosphatase: 109 U/L (ref 38–126)
Anion gap: 9 (ref 5–15)
BUN: 16 mg/dL (ref 6–20)
CHLORIDE: 106 mmol/L (ref 98–111)
CO2: 24 mmol/L (ref 22–32)
Calcium: 9.4 mg/dL (ref 8.9–10.3)
Creatinine, Ser: 1.1 mg/dL (ref 0.61–1.24)
GFR calc Af Amer: >60 mL/min (ref >60)
GFR calc non Af Amer: >60 mL/min (ref >60)
Glucose, Bld: 255 mg/dL — ABNORMAL HIGH (ref 70–99)
Potassium: 3.2 mmol/L — ABNORMAL LOW (ref 3.5–5.1)
Sodium: 139 mmol/L (ref 135–145)
Total Bilirubin: 0.6 mg/dL (ref 0.3–1.2)
Total Protein: 7.1 g/dL (ref 6.5–8.1)

## 2019-01-29 LAB — CBC WITH DIFFERENTIAL/PLATELET
Abs Immature Granulocytes: 0.01 10*3/uL (ref 0.00–0.07)
Basophils Absolute: 0.1 10*3/uL (ref 0.0–0.1)
Basophils Relative: 1 %
Eosinophils Absolute: 0.6 10*3/uL — ABNORMAL HIGH (ref 0.0–0.5)
Eosinophils Relative: 7 %
HCT: 38.9 % — ABNORMAL LOW (ref 39.0–52.0)
HEMOGLOBIN: 12.8 g/dL — AB (ref 13.0–17.0)
Immature Granulocytes: 0 %
Lymphocytes Relative: 29 %
Lymphs Abs: 2.3 10*3/uL (ref 0.7–4.0)
MCH: 28.1 pg (ref 26.0–34.0)
MCHC: 32.9 g/dL (ref 30.0–36.0)
MCV: 85.5 fL (ref 80.0–100.0)
Monocytes Absolute: 0.6 10*3/uL (ref 0.1–1.0)
Monocytes Relative: 8 %
NEUTROS ABS: 4.4 10*3/uL (ref 1.7–7.7)
Neutrophils Relative %: 55 %
Platelets: 157 10*3/uL (ref 150–400)
RBC: 4.55 MIL/uL (ref 4.22–5.81)
RDW: 13 % (ref 11.5–15.5)
WBC: 8 10*3/uL (ref 4.0–10.5)
nRBC: 0 % (ref 0.0–0.2)

## 2019-01-29 NOTE — Telephone Encounter (Signed)
Scheduled appt per 02/11 los. ° °Printed calendar and avs. °

## 2019-01-29 NOTE — Progress Notes (Signed)
Hematology and Oncology Follow Up Visit  Adam Cruz [id number] [birth year] 58 y.o. 01/29/2019 12:02 PM Adam Cruz, MDSaeed, Adam Leo, MD   Principle Diagnosis: 58 year old man with castration-sensitive advanced prostate cancer with lymphadenopathy diagnosed in June 2018.  He presented with PSA of 66 and a Gleason score of 8.    Prior Therapy:  Androgen deprivation therapy started in July 2018.  Current therapy:  Lupron 30 mg every 4 months given on November 21, 2017.  His next injection is scheduled for April 2019.  Zytiga 1000 mg daily started in August 2018.  His dose was reduced to 750 mg daily started in March 2019.  Interim History: Adam Cruz is here for repeat evaluation.  Since last visit, he reports feeling reasonably well and continues to show improvement in his overall health.  He has tolerated Zytiga at the current dose without any need for dose reduction at this time.  His appetite is reasonable and his performance status is improved.  He does report more stability and decrease in his weakness or falls.  He is no longer requiring a cane or assistance and ambulating.  He denies any nausea or edema.  His quality of life and performance status remains unchanged.  He does not report any headaches, syncope or seizures..  He denied dizziness or alteration in mentation.  He does not report any fevers, chills, sweats.  He does not report any chest pain, palpitation, orthopnea.  He does not report any cough, wheezing, hemoptysis or dyspnea on exertion. He does not report any nausea, vomiting or early satiety.  Denies any depression or diarrhea.  He denied any bleeding or clotting tendency.  He denies any mood changes.  He does not report any skin rashes or ecchymosis or petechiae.  Denies any heat or cold intolerance.  Remaining review of systems is negative.  Medications: I have reviewed the patient's current medications.  Current Outpatient Medications  Medication Sig Dispense  Refill  . abiraterone acetate (ZYTIGA) 250 MG tablet Take 3 tablets (750 mg total) by mouth daily. Take on an empty stomach 1 hour before or 2 hours after a meal 120 tablet 3  . amitriptyline (ELAVIL) 25 MG tablet     . ASPIRIN 81 PO TK 1 T PO QD  6  . carvedilol (COREG) 6.25 MG tablet   4  . Cholecalciferol (VITAMIN D3) 5000 units CAPS TAKE 1 BY MOUTH DAILY    . Gabapentin Enacarbil (HORIZANT) 600 MG TBCR TK 1 T PO BID    . glimepiride (AMARYL) 4 MG tablet TK 1 T PO QD  2  . GLIPIZIDE PO Take by mouth.    . Insulin Glargine-Lixisenatide 100-33 UNT-MCG/ML SOPN Inject into the skin.    Marland Kitchen JARDIANCE 25 MG TABS tablet TK 1 T PO IN THE MORNING  4  . levothyroxine (SYNTHROID, LEVOTHROID) 125 MCG tablet     . lisinopril (PRINIVIL,ZESTRIL) 20 MG tablet TK 1 T PO D  6  . omeprazole (PRILOSEC) 40 MG capsule TAKE 1 CAPSULE BY MOUTH ONCE DAILY BEFORE A MEAL IN COMBINATION WITH CLARITHROMYCIN    . potassium chloride SA (K-DUR,KLOR-CON) 20 MEQ tablet Take 1 tablet (20 mEq total) by mouth daily. 30 tablet 0  . rosuvastatin (CRESTOR) 40 MG tablet     . ticagrelor (BRILINTA) 90 MG TABS tablet Take by mouth.    Marland Kitchen ZYTIGA 250 MG tablet TAKE 3 TABLETS BY MOUTH EVERY DAY. TAKE ON AN EMPTY STOMACH 1 HOUR BEFORE OR 2 HOURS AFTER  A MEAL. 90 tablet 0   No current facility-administered medications for this visit.      Allergies: No Known Allergies  Past Medical History, Surgical history, Social history, and Family History are unchanged.  He denies any home or tobacco use.  Physical Exam:  Blood pressure (!) 158/92, pulse 70, temperature 97.8 F (36.6 C), temperature source Oral, resp. rate 18, height 6\' 1"  (1.854 m), weight 212 lb 12.8 oz (96.5 kg), SpO2 100 %.    ECOG: 1     General appearance:  Alert, comfortable gentleman that appeared well-appearing. Head: Normocephalic without any trauma Oropharynx: No ulcers or thrush noted on inspection. Eyes: Sclera anicteric. Lymph nodes: No lymphadenopathy  palpated on exam.  Normal cervical, axillary or inguinal areas. Heart: S1 and S2 without any murmurs.  Regular rate and rhythm. Lung: Clear without any rhonchi or wheezes.  No dullness to percussion. Abdomin: Soft, nontender, nondistended with good bowel sounds.  No shifting dullness or ascites. Musculoskeletal: Full range of motion noted without any deformity. Neurological: Normal motor, sensory exam. Skin: No skin rashes, petechia or ecchymosis.     Lab Adam: Lab Adam  Component Value Date   WBC 8.0 01/29/2019   HGB 12.8 (L) 01/29/2019   HCT 38.9 (L) 01/29/2019   MCV 85.5 01/29/2019   PLT 157 01/29/2019     Chemistry      Component Value Date/Time   NA 143 11/27/2018 1255   NA 140 11/21/2017 1444   K 2.7 (LL) 11/27/2018 1255   K 3.6 11/21/2017 1444   CL 108 11/27/2018 1255   CO2 24 11/27/2018 1255   CO2 25 11/21/2017 1444   BUN 15 11/27/2018 1255   BUN 14.2 11/21/2017 1444   CREATININE 1.08 11/27/2018 1255   CREATININE 0.99 10/02/2018 1131   CREATININE 1.1 11/21/2017 1444      Component Value Date/Time   CALCIUM 9.4 11/27/2018 1255   CALCIUM 9.4 11/21/2017 1444   ALKPHOS 114 11/27/2018 1255   ALKPHOS 119 11/21/2017 1444   AST 65 (H) 11/27/2018 1255   AST 61 (H) 10/02/2018 1131   AST 29 11/21/2017 1444   ALT 66 (H) 11/27/2018 1255   ALT 75 (H) 10/02/2018 1131   ALT 32 11/21/2017 1444   BILITOT 0.7 11/27/2018 1255   BILITOT 0.6 10/02/2018 1131   BILITOT 0.40 11/21/2017 1444     Adam Cruz, Adam Cruz (MRN [id number]) as of 01/29/2019 11:49  Ref. Range 10/02/2018 11:31 11/27/2018 12:55  Prostate Specific Ag, Serum Latest Ref Range: 0.0 - 4.0 ng/mL <0.1 <0.1     Impression and Plan:  58 year old man with:   1.  Castration-sensitive advanced prostate cancer with lymphadenopathy diagnosed in June 2018.    He continues to be on Zytiga with excellent response and reasonable PSA decline.  His PSA remains undetectable and imaging studies in  December 2019 showed no evidence of disease relapse.  Risks and benefits of continuing this therapy long-term was reviewed today and is agreeable to continue.  2. Androgen deprivation therapy: He continues to be on Lupron which I recommended continuing indefinitely.  Long-term complications were reiterated which includes osteoporosis, weight gain and sexual dysfunction.  3.  Hypokalemia: He remains on potassium supplements and will be repeated periodically.  His hypokalemia is related to [facility] and his antihypertensive medication.  4.  Diabetes: Blood sugar remains adequate at this time.  No recent exacerbation..  5.  Liver function test surveillance: Mild elevation noted related to Zytiga.  We will  continue to monitor.  6.  Hypertension: Slightly elevated because of Zytiga but overall manageable at this time.  E.  7.  Arthralgias and muscle weakness: Continue to improve at this time with lower dose of Zytiga.  8. Follow-up: Will be in 2 months.  25 minutes was spent with the patient face-to-face today.  More than 50% of time was spent on reviewing his disease status, treatment options and managing toxicity associated with therapy.  Zola Button, MD 2/11/202012:02 PM

## 2019-01-30 LAB — PROSTATE-SPECIFIC AG, SERUM (LABCORP)

## 2019-01-31 DIAGNOSIS — E113513 Type 2 diabetes mellitus with proliferative diabetic retinopathy with macular edema, bilateral: Secondary | ICD-10-CM | POA: Diagnosis not present

## 2019-02-05 ENCOUNTER — Other Ambulatory Visit: Payer: Self-pay | Admitting: Oncology

## 2019-02-06 ENCOUNTER — Telehealth: Payer: Self-pay

## 2019-02-06 NOTE — Telephone Encounter (Signed)
-----   Message from Wyatt Portela, MD sent at 02/04/2019  5:56 PM EST ----- Please let him know his PSA is low

## 2019-02-06 NOTE — Telephone Encounter (Signed)
Contacted patient and left message with results

## 2019-02-12 DIAGNOSIS — H259 Unspecified age-related cataract: Secondary | ICD-10-CM | POA: Diagnosis not present

## 2019-02-12 DIAGNOSIS — Z7982 Long term (current) use of aspirin: Secondary | ICD-10-CM | POA: Diagnosis not present

## 2019-02-12 DIAGNOSIS — H25811 Combined forms of age-related cataract, right eye: Secondary | ICD-10-CM | POA: Diagnosis not present

## 2019-02-12 DIAGNOSIS — G4733 Obstructive sleep apnea (adult) (pediatric): Secondary | ICD-10-CM | POA: Diagnosis not present

## 2019-02-12 DIAGNOSIS — Z955 Presence of coronary angioplasty implant and graft: Secondary | ICD-10-CM | POA: Diagnosis not present

## 2019-02-12 DIAGNOSIS — Z85830 Personal history of malignant neoplasm of bone: Secondary | ICD-10-CM | POA: Diagnosis not present

## 2019-02-12 DIAGNOSIS — E1136 Type 2 diabetes mellitus with diabetic cataract: Secondary | ICD-10-CM | POA: Diagnosis not present

## 2019-02-12 DIAGNOSIS — E039 Hypothyroidism, unspecified: Secondary | ICD-10-CM | POA: Diagnosis not present

## 2019-02-12 DIAGNOSIS — Z8546 Personal history of malignant neoplasm of prostate: Secondary | ICD-10-CM | POA: Diagnosis not present

## 2019-02-12 DIAGNOSIS — Z794 Long term (current) use of insulin: Secondary | ICD-10-CM | POA: Diagnosis not present

## 2019-02-12 DIAGNOSIS — I2510 Atherosclerotic heart disease of native coronary artery without angina pectoris: Secondary | ICD-10-CM | POA: Diagnosis not present

## 2019-02-12 DIAGNOSIS — I10 Essential (primary) hypertension: Secondary | ICD-10-CM | POA: Diagnosis not present

## 2019-02-12 DIAGNOSIS — E785 Hyperlipidemia, unspecified: Secondary | ICD-10-CM | POA: Diagnosis not present

## 2019-02-12 DIAGNOSIS — I252 Old myocardial infarction: Secondary | ICD-10-CM | POA: Diagnosis not present

## 2019-02-12 DIAGNOSIS — Z79899 Other long term (current) drug therapy: Secondary | ICD-10-CM | POA: Diagnosis not present

## 2019-02-12 DIAGNOSIS — Z8589 Personal history of malignant neoplasm of other organs and systems: Secondary | ICD-10-CM | POA: Diagnosis not present

## 2019-02-12 DIAGNOSIS — E1140 Type 2 diabetes mellitus with diabetic neuropathy, unspecified: Secondary | ICD-10-CM | POA: Diagnosis not present

## 2019-02-12 HISTORY — PX: CATARACT EXTRACTION: SUR2

## 2019-02-25 DIAGNOSIS — J209 Acute bronchitis, unspecified: Secondary | ICD-10-CM | POA: Diagnosis not present

## 2019-03-01 ENCOUNTER — Encounter (HOSPITAL_COMMUNITY): Payer: Self-pay | Admitting: *Deleted

## 2019-03-01 ENCOUNTER — Emergency Department (HOSPITAL_COMMUNITY): Payer: BLUE CROSS/BLUE SHIELD

## 2019-03-01 ENCOUNTER — Inpatient Hospital Stay (HOSPITAL_COMMUNITY)
Admission: EM | Admit: 2019-03-01 | Discharge: 2019-03-04 | DRG: 193 | Disposition: A | Discharge status: Home / Self Care | Payer: BLUE CROSS/BLUE SHIELD | Attending: Internal Medicine | Admitting: Internal Medicine

## 2019-03-01 ENCOUNTER — Other Ambulatory Visit: Payer: Self-pay

## 2019-03-01 DIAGNOSIS — J189 Pneumonia, unspecified organism: Secondary | ICD-10-CM | POA: Diagnosis not present

## 2019-03-01 DIAGNOSIS — J9601 Acute respiratory failure with hypoxia: Secondary | ICD-10-CM | POA: Diagnosis present

## 2019-03-01 DIAGNOSIS — Z800 Family history of malignant neoplasm of digestive organs: Secondary | ICD-10-CM | POA: Diagnosis not present

## 2019-03-01 DIAGNOSIS — Z79890 Hormone replacement therapy (postmenopausal): Secondary | ICD-10-CM | POA: Diagnosis not present

## 2019-03-01 DIAGNOSIS — E785 Hyperlipidemia, unspecified: Secondary | ICD-10-CM | POA: Diagnosis not present

## 2019-03-01 DIAGNOSIS — R05 Cough: Secondary | ICD-10-CM

## 2019-03-01 DIAGNOSIS — J209 Acute bronchitis, unspecified: Secondary | ICD-10-CM | POA: Diagnosis not present

## 2019-03-01 DIAGNOSIS — E876 Hypokalemia: Secondary | ICD-10-CM | POA: Diagnosis present

## 2019-03-01 DIAGNOSIS — Z7902 Long term (current) use of antithrombotics/antiplatelets: Secondary | ICD-10-CM

## 2019-03-01 DIAGNOSIS — J069 Acute upper respiratory infection, unspecified: Secondary | ICD-10-CM | POA: Insufficient documentation

## 2019-03-01 DIAGNOSIS — Z803 Family history of malignant neoplasm of breast: Secondary | ICD-10-CM

## 2019-03-01 DIAGNOSIS — Z7982 Long term (current) use of aspirin: Secondary | ICD-10-CM

## 2019-03-01 DIAGNOSIS — I10 Essential (primary) hypertension: Secondary | ICD-10-CM | POA: Diagnosis not present

## 2019-03-01 DIAGNOSIS — Z7901 Long term (current) use of anticoagulants: Secondary | ICD-10-CM

## 2019-03-01 DIAGNOSIS — E119 Type 2 diabetes mellitus without complications: Secondary | ICD-10-CM | POA: Diagnosis not present

## 2019-03-01 DIAGNOSIS — R531 Weakness: Secondary | ICD-10-CM | POA: Diagnosis not present

## 2019-03-01 DIAGNOSIS — Z7951 Long term (current) use of inhaled steroids: Secondary | ICD-10-CM

## 2019-03-01 DIAGNOSIS — E039 Hypothyroidism, unspecified: Secondary | ICD-10-CM | POA: Diagnosis not present

## 2019-03-01 DIAGNOSIS — I252 Old myocardial infarction: Secondary | ICD-10-CM

## 2019-03-01 DIAGNOSIS — R059 Cough, unspecified: Secondary | ICD-10-CM

## 2019-03-01 DIAGNOSIS — I2510 Atherosclerotic heart disease of native coronary artery without angina pectoris: Secondary | ICD-10-CM | POA: Diagnosis present

## 2019-03-01 DIAGNOSIS — J440 Chronic obstructive pulmonary disease with acute lower respiratory infection: Secondary | ICD-10-CM | POA: Diagnosis not present

## 2019-03-01 DIAGNOSIS — C61 Malignant neoplasm of prostate: Secondary | ICD-10-CM

## 2019-03-01 DIAGNOSIS — R51 Headache: Secondary | ICD-10-CM | POA: Diagnosis not present

## 2019-03-01 HISTORY — DX: Acute bronchitis, unspecified: J20.9

## 2019-03-01 HISTORY — DX: Hypokalemia: E87.6

## 2019-03-01 LAB — URINALYSIS, ROUTINE W REFLEX MICROSCOPIC
Bacteria, UA: NONE SEEN
Bilirubin Urine: NEGATIVE
Ketones, ur: NEGATIVE mg/dL
Leukocytes,Ua: NEGATIVE
Nitrite: NEGATIVE
Protein, ur: 100 mg/dL — AB
Specific Gravity, Urine: 1.032 — ABNORMAL HIGH (ref 1.005–1.030)
pH: 6 (ref 5.0–8.0)

## 2019-03-01 LAB — INFLUENZA PANEL BY PCR (TYPE A & B)
Influenza A By PCR: NEGATIVE
Influenza B By PCR: NEGATIVE

## 2019-03-01 LAB — COMPREHENSIVE METABOLIC PANEL
ALT: 26 U/L (ref 0–44)
AST: 42 U/L — ABNORMAL HIGH (ref 15–41)
Albumin: 3.2 g/dL — ABNORMAL LOW (ref 3.5–5.0)
Alkaline Phosphatase: 86 U/L (ref 38–126)
Anion gap: 10 (ref 5–15)
BUN: 12 mg/dL (ref 6–20)
CO2: 27 mmol/L (ref 22–32)
Calcium: 8.5 mg/dL — ABNORMAL LOW (ref 8.9–10.3)
Chloride: 104 mmol/L (ref 98–111)
Creatinine, Ser: 0.89 mg/dL (ref 0.61–1.24)
GFR calc Af Amer: >60 mL/min (ref >60)
GFR calc non Af Amer: >60 mL/min (ref >60)
Glucose, Bld: 159 mg/dL — ABNORMAL HIGH (ref 70–99)
Potassium: <2 mmol/L — CL (ref 3.5–5.1)
Sodium: 141 mmol/L (ref 135–145)
Total Bilirubin: 0.8 mg/dL (ref 0.3–1.2)
Total Protein: 6.5 g/dL (ref 6.5–8.1)

## 2019-03-01 LAB — CBC WITH DIFFERENTIAL/PLATELET
Abs Immature Granulocytes: 0.05 10*3/uL (ref 0.00–0.07)
Basophils Absolute: 0 10*3/uL (ref 0.0–0.1)
Basophils Relative: 0 %
EOS ABS: 0.4 10*3/uL (ref 0.0–0.5)
EOS PCT: 4 %
HCT: 32.9 % — ABNORMAL LOW (ref 39.0–52.0)
Hemoglobin: 11 g/dL — ABNORMAL LOW (ref 13.0–17.0)
Immature Granulocytes: 1 %
Lymphocytes Relative: 18 %
Lymphs Abs: 1.6 10*3/uL (ref 0.7–4.0)
MCH: 27.9 pg (ref 26.0–34.0)
MCHC: 33.4 g/dL (ref 30.0–36.0)
MCV: 83.5 fL (ref 80.0–100.0)
Monocytes Absolute: 1 10*3/uL (ref 0.1–1.0)
Monocytes Relative: 11 %
Neutro Abs: 6.1 10*3/uL (ref 1.7–7.7)
Neutrophils Relative %: 66 %
PLATELETS: 175 10*3/uL (ref 150–400)
RBC: 3.94 MIL/uL — ABNORMAL LOW (ref 4.22–5.81)
RDW: 13.2 % (ref 11.5–15.5)
WBC: 9.2 10*3/uL (ref 4.0–10.5)
nRBC: 0 % (ref 0.0–0.2)

## 2019-03-01 LAB — PROTIME-INR
INR: 1.1 (ref 0.8–1.2)
PROTHROMBIN TIME: 14.1 s (ref 11.4–15.2)

## 2019-03-01 LAB — BASIC METABOLIC PANEL
Anion gap: 9 (ref 5–15)
BUN: 10 mg/dL (ref 6–20)
CO2: 28 mmol/L (ref 22–32)
Calcium: 8.9 mg/dL (ref 8.9–10.3)
Chloride: 105 mmol/L (ref 98–111)
Creatinine, Ser: 0.94 mg/dL (ref 0.61–1.24)
GFR calc non Af Amer: >60 mL/min (ref >60)
Glucose, Bld: 148 mg/dL — ABNORMAL HIGH (ref 70–99)
Potassium: 2.3 mmol/L — CL (ref 3.5–5.1)
Sodium: 142 mmol/L (ref 135–145)

## 2019-03-01 LAB — RESPIRATORY PANEL BY PCR
Adenovirus: NOT DETECTED
Bordetella pertussis: NOT DETECTED
CHLAMYDOPHILA PNEUMONIAE-RVPPCR: NOT DETECTED
Coronavirus 229E: NOT DETECTED
Coronavirus HKU1: NOT DETECTED
Coronavirus NL63: NOT DETECTED
Coronavirus OC43: NOT DETECTED
Influenza A: NOT DETECTED
Influenza B: NOT DETECTED
MYCOPLASMA PNEUMONIAE-RVPPCR: NOT DETECTED
Metapneumovirus: NOT DETECTED
Parainfluenza Virus 1: NOT DETECTED
Parainfluenza Virus 2: NOT DETECTED
Parainfluenza Virus 3: NOT DETECTED
Parainfluenza Virus 4: NOT DETECTED
Respiratory Syncytial Virus: NOT DETECTED
Rhinovirus / Enterovirus: NOT DETECTED

## 2019-03-01 LAB — LACTIC ACID, PLASMA
LACTIC ACID, VENOUS: 1.5 mmol/L (ref 0.5–1.9)
Lactic Acid, Venous: 1.1 mmol/L (ref 0.5–1.9)

## 2019-03-01 LAB — MAGNESIUM: MAGNESIUM: 2 mg/dL (ref 1.7–2.4)

## 2019-03-01 LAB — PHOSPHORUS: Phosphorus: 2.2 mg/dL — ABNORMAL LOW (ref 2.5–4.6)

## 2019-03-01 LAB — MRSA PCR SCREENING: MRSA by PCR: NEGATIVE

## 2019-03-01 LAB — PROCALCITONIN: Procalcitonin: <0.1 ng/mL

## 2019-03-01 LAB — GLUCOSE, CAPILLARY: Glucose-Capillary: 158 mg/dL — ABNORMAL HIGH (ref 70–99)

## 2019-03-01 MED ORDER — ACETAMINOPHEN 325 MG PO TABS
650.0000 mg | ORAL_TABLET | Freq: Three times a day (TID) | ORAL | Status: DC | PRN
  Administered 2019-03-01 – 2019-03-02 (×2): 650 mg via ORAL
  Filled 2019-03-01 (×2): qty 2

## 2019-03-01 MED ORDER — POTASSIUM CHLORIDE CRYS ER 20 MEQ PO TBCR
40.0000 meq | EXTENDED_RELEASE_TABLET | Freq: Once | ORAL | Status: DC
  Filled 2019-03-01: qty 2

## 2019-03-01 MED ORDER — LEVOTHYROXINE SODIUM 112 MCG PO TABS
112.0000 ug | ORAL_TABLET | Freq: Every day | ORAL | Status: DC
  Administered 2019-03-02 – 2019-03-04 (×3): 112 ug via ORAL
  Filled 2019-03-01 (×3): qty 1

## 2019-03-01 MED ORDER — GUAIFENESIN-CODEINE 100-10 MG/5ML PO SOLN
5.0000 mL | ORAL | Status: DC | PRN
  Administered 2019-03-01 – 2019-03-04 (×7): 5 mL via ORAL
  Filled 2019-03-01 (×8): qty 5

## 2019-03-01 MED ORDER — TICAGRELOR 90 MG PO TABS
90.0000 mg | ORAL_TABLET | Freq: Two times a day (BID) | ORAL | Status: DC
  Administered 2019-03-01 – 2019-03-04 (×6): 90 mg via ORAL
  Filled 2019-03-01 (×6): qty 1

## 2019-03-01 MED ORDER — INSULIN ASPART 100 UNIT/ML ~~LOC~~ SOLN
0.0000 [IU] | Freq: Three times a day (TID) | SUBCUTANEOUS | Status: DC
  Administered 2019-03-02 (×2): 2 [IU] via SUBCUTANEOUS
  Administered 2019-03-02 – 2019-03-03 (×3): 1 [IU] via SUBCUTANEOUS
  Administered 2019-03-04: 2 [IU] via SUBCUTANEOUS

## 2019-03-01 MED ORDER — FENTANYL CITRATE (PF) 100 MCG/2ML IJ SOLN
50.0000 ug | Freq: Once | INTRAMUSCULAR | Status: AC
  Administered 2019-03-01: 50 ug via INTRAVENOUS
  Filled 2019-03-01: qty 2

## 2019-03-01 MED ORDER — ORAL CARE MOUTH RINSE
15.0000 mL | Freq: Two times a day (BID) | OROMUCOSAL | Status: DC
  Administered 2019-03-01 – 2019-03-04 (×5): 15 mL via OROMUCOSAL

## 2019-03-01 MED ORDER — CARVEDILOL 6.25 MG PO TABS
6.2500 mg | ORAL_TABLET | Freq: Two times a day (BID) | ORAL | Status: DC
  Administered 2019-03-01 – 2019-03-04 (×6): 6.25 mg via ORAL
  Filled 2019-03-01 (×7): qty 1

## 2019-03-01 MED ORDER — AMLODIPINE BESYLATE 5 MG PO TABS
5.0000 mg | ORAL_TABLET | Freq: Every day | ORAL | Status: DC
  Administered 2019-03-01 – 2019-03-04 (×4): 5 mg via ORAL
  Filled 2019-03-01 (×4): qty 1

## 2019-03-01 MED ORDER — CARVEDILOL 25 MG PO TABS: 25.0000 mg | ORAL_TABLET | Freq: Two times a day (BID) | ORAL | Status: DC

## 2019-03-01 MED ORDER — IPRATROPIUM-ALBUTEROL 0.5-2.5 (3) MG/3ML IN SOLN: 3.0000 mL | Freq: Four times a day (QID) | RESPIRATORY_TRACT | Status: DC

## 2019-03-01 MED ORDER — VANCOMYCIN HCL 10 G IV SOLR
1500.0000 mg | Freq: Two times a day (BID) | INTRAVENOUS | Status: DC
  Filled 2019-03-01: qty 1500

## 2019-03-01 MED ORDER — VANCOMYCIN HCL 10 G IV SOLR
2000.0000 mg | Freq: Once | INTRAVENOUS | Status: AC
  Administered 2019-03-01: 2000 mg via INTRAVENOUS
  Filled 2019-03-01: qty 2000

## 2019-03-01 MED ORDER — FLUTICASONE PROPIONATE 50 MCG/ACT NA SUSP
1.0000 | Freq: Every day | NASAL | Status: DC
  Administered 2019-03-01 – 2019-03-04 (×4): 1 via NASAL
  Filled 2019-03-01: qty 16

## 2019-03-01 MED ORDER — SODIUM CHLORIDE 0.9 % IV SOLN
2.0000 g | Freq: Three times a day (TID) | INTRAVENOUS | Status: DC
  Administered 2019-03-01 – 2019-03-04 (×9): 2 g via INTRAVENOUS
  Filled 2019-03-01 (×9): qty 2

## 2019-03-01 MED ORDER — ALBUTEROL SULFATE (2.5 MG/3ML) 0.083% IN NEBU
2.5000 mg | INHALATION_SOLUTION | Freq: Four times a day (QID) | RESPIRATORY_TRACT | Status: DC | PRN
  Administered 2019-03-02: 2.5 mg via RESPIRATORY_TRACT
  Filled 2019-03-01: qty 3

## 2019-03-01 MED ORDER — POTASSIUM CHLORIDE CRYS ER 20 MEQ PO TBCR
40.0000 meq | EXTENDED_RELEASE_TABLET | ORAL | Status: AC
  Administered 2019-03-01 (×2): 40 meq via ORAL
  Filled 2019-03-01: qty 2

## 2019-03-01 MED ORDER — SODIUM CHLORIDE 0.9 % IV SOLN
INTRAVENOUS | Status: DC
  Administered 2019-03-01 – 2019-03-03 (×6): via INTRAVENOUS

## 2019-03-01 MED ORDER — ENOXAPARIN SODIUM 30 MG/0.3ML ~~LOC~~ SOLN: 30.0000 mg | SUBCUTANEOUS | Status: DC

## 2019-03-01 MED ORDER — INSULIN ASPART 100 UNIT/ML ~~LOC~~ SOLN: 0.0000 [IU] | Freq: Every day | SUBCUTANEOUS | Status: DC

## 2019-03-01 MED ORDER — AMITRIPTYLINE HCL 25 MG PO TABS
25.0000 mg | ORAL_TABLET | Freq: Every evening | ORAL | Status: DC
  Administered 2019-03-01 – 2019-03-03 (×3): 25 mg via ORAL
  Filled 2019-03-01 (×3): qty 1

## 2019-03-01 MED ORDER — ENOXAPARIN SODIUM 40 MG/0.4ML ~~LOC~~ SOLN
40.0000 mg | SUBCUTANEOUS | Status: DC
  Administered 2019-03-01 – 2019-03-03 (×3): 40 mg via SUBCUTANEOUS
  Filled 2019-03-01 (×3): qty 0.4

## 2019-03-01 MED ORDER — POTASSIUM CHLORIDE 10 MEQ/100ML IV SOLN
10.0000 meq | INTRAVENOUS | Status: AC
  Administered 2019-03-01 (×4): 10 meq via INTRAVENOUS
  Filled 2019-03-01 (×4): qty 100

## 2019-03-01 MED ORDER — SODIUM CHLORIDE 0.9 % IV BOLUS
1000.0000 mL | Freq: Once | INTRAVENOUS | Status: AC
  Administered 2019-03-01: 1000 mL via INTRAVENOUS

## 2019-03-01 NOTE — ED Notes (Signed)
Patient transported to CT 

## 2019-03-01 NOTE — ED Notes (Signed)
Date and time results received: 03/01/19 4:03 PM  (use smartphrase ".now" to insert current time)  Test: K+ Critical Value: <2  Name of Provider Notified: Notified Sharrie Rothman, RN  Orders Received? Or Actions Taken?: Actions Taken: Notitifed Dr Alvino Chapel

## 2019-03-01 NOTE — Progress Notes (Signed)
Pharmacy Antibiotic Note  Adam Cruz is a 58 y.o. male with a h/o stage iv prostate canceradmitted on 03/01/2019 with one week h/o fever, sob, and cough.  Pharmacy has been consulted for vancomycin and cefepime dosing. Received a course of Tamiflu although flu tests were negative.   Plan: Cefepime 2 g iv q8h.   Vancomycin 2000 mg iv once followed by:  Vancomycin 1500 mg IV Q 12 hrs. Goal AUC 400-550. Expected AUC: 493 SCr used: 0.89  F/U SCr, culture results, MRSA PCR, plans for abx  Height: 6' (182.9 cm) Weight: 212 lb (96.2 kg) IBW/kg (Calculated) : 77.6  Temp (24hrs), Avg:99.6 F (37.6 C), Min:99.6 F (37.6 C), Max:99.6 F (37.6 C)  Recent Labs  Lab 03/01/19 1514  WBC 9.2  CREATININE 0.89  LATICACIDVEN 1.5    Estimated Creatinine Clearance: 110.1 mL/min (by C-G formula based on SCr of 0.89 mg/dL).    No Known Allergies  Antimicrobials this admission: 3/13 cefepime >>  3/13 vancomycin >>   Dose adjustments this admission:  Microbiology results: 3/13 BCx: sent 3/13 Sputum: sent  3/13 MRSA PCR:  Thank you for allowing pharmacy to be a part of this patient's care.  Napoleon Form 03/01/2019 6:23 PM

## 2019-03-01 NOTE — Progress Notes (Signed)
CRITICAL VALUE ALERT  Critical Value:  K 2.3  Date & Time Notied:  02/28/19 2053  Provider Notified: Sherral Hammers  Orders Received/Actions taken:K PO and IV already ordered. Proceed with administration.

## 2019-03-01 NOTE — Plan of Care (Signed)

## 2019-03-01 NOTE — ED Notes (Signed)
ED TO INPATIENT HANDOFF REPORT  ED Nurse Name and Phone #: [phone]  S Name/Age/Gender Adam Cruz 58 y.o. male Room/Bed: WA18/WA18  Code Status   Code Status: Full Code  Home/SNF/Other Home Patient oriented to: situation Is this baseline? Yes   Triage Complete: Triage complete  Chief Complaint Weakness  Triage Note Pt c/o general weakness since 3/8.  Tested for flu 3/10- negative.  Pt appearing jaundice, barely able to stand and pivot.  Pt reports hx of Stage 4 prostate cancer, been receiving tx for appx 2 years.  Denies n/v/d.  C/o extreme dizziness. Reports poor PO intake since Sunday.    Pt reports home fever of 102.0 took Advil appx 40 min PTA.     Allergies No Known Allergies  Level of Care/Admitting Diagnosis ED Disposition    ED Disposition Condition Comment   Admit  Hospital Area: [place] [[id number]]  Level of Care: Telemetry [5]  Admit to tele based on following criteria: Complex arrhythmia (Bradycardia/Tachycardia)  Diagnosis: Hypokalemia [725366]  Admitting Physician: Georgette Shell [[id number]]  Attending Physician: Georgette Shell [[id number]]  Estimated length of stay: 3 - 4 days  Certification:: I certify this patient will need inpatient services for at least 2 midnights  PT Class (Do Not Modify): Inpatient [101]  PT Acc Code (Do Not Modify): Private [1]       B Medical/Surgery History Past Medical History:  Diagnosis Date  . Diabetes mellitus without complication ([place])   . Family history of breast cancer   . Family history of breast cancer in male   . Family history of pancreatic cancer   . Hyperlipidemia   . Hypertension   . MI (myocardial infarction) ([place])   . Prostate cancer ([place]) dx'd 06/2017   Past Surgical History:  Procedure Laterality Date  . APPENDECTOMY    . CARDIAC CATHETERIZATION    . PROSTATE BIOPSY    . SHOULDER SURGERY       A IV Location/Drains/Wounds Patient Lines/Drains/Airways  Status   Active Line/Drains/Airways    Name:   Placement date:   Placement time:   Site:   Days:   Peripheral IV 03/01/19 Left;Posterior Wrist   03/01/19    1510    Wrist   less than 1   Peripheral IV 03/01/19 Left Antecubital   03/01/19    1511    Antecubital   less than 1          Intake/Output Last 24 hours  Intake/Output Summary (Last 24 hours) at 03/01/2019 7564 Last data filed at 03/01/2019 1729 Gross per 24 hour  Intake 1000 ml  Output -  Net 1000 ml    Labs/Imaging Results for orders placed or performed during the hospital encounter of 03/01/19 (from the past 48 hour(s))  Urinalysis, Routine w reflex microscopic     Status: Abnormal   Collection Time: 03/01/19  2:51 PM  Result Value Ref Range   Color, Urine YELLOW YELLOW   APPearance CLEAR CLEAR   Specific Gravity, Urine 1.032 (H) 1.005 - 1.030   pH 6.0 5.0 - 8.0   Glucose, UA >=500 (A) NEGATIVE mg/dL   Hgb urine dipstick MODERATE (A) NEGATIVE   Bilirubin Urine NEGATIVE NEGATIVE   Ketones, ur NEGATIVE NEGATIVE mg/dL   Protein, ur 100 (A) NEGATIVE mg/dL   Nitrite NEGATIVE NEGATIVE   Leukocytes,Ua NEGATIVE NEGATIVE   RBC / HPF 6-10 0 - 5 RBC/hpf   WBC, UA 0-5 0 - 5 WBC/hpf  Bacteria, UA NONE SEEN NONE SEEN   Squamous Epithelial / LPF 0-5 0 - 5    Comment: Performed at [facility], [place] [address] [place], [place] [address]  Comprehensive metabolic panel     Status: Abnormal   Collection Time: 03/01/19  3:14 PM  Result Value Ref Range   Sodium 141 135 - 145 mmol/L   Potassium <2.0 (LL) 3.5 - 5.1 mmol/L    Comment: CRITICAL RESULT CALLED TO, READ BACK BY AND VERIFIED WITH: CLAPP,S. RN @1603  ON 03.13.2020 BY COHEN,K    Chloride 104 98 - 111 mmol/L   CO2 27 22 - 32 mmol/L   Glucose, Bld 159 (H) 70 - 99 mg/dL   BUN 12 6 - 20 mg/dL   Creatinine, Ser 0.89 0.61 - 1.24 mg/dL   Calcium 8.5 (L) 8.9 - 10.3 mg/dL   Total Protein 6.5 6.5 - 8.1 g/dL   Albumin 3.2 (L) 3.5 - 5.0 g/dL   AST 42 (H) 15 -  41 U/L   ALT 26 0 - 44 U/L   Alkaline Phosphatase 86 38 - 126 U/L   Total Bilirubin 0.8 0.3 - 1.2 mg/dL   GFR calc non Af Amer >60 >60 mL/min   GFR calc Af Amer >60 >60 mL/min   Anion gap 10 5 - 15    Comment: Performed at [facility], [place] [address], [place], [place] [address]  Lactic acid, plasma     Status: None   Collection Time: 03/01/19  3:14 PM  Result Value Ref Range   Lactic Acid, Venous 1.5 0.5 - 1.9 mmol/L    Comment: Performed at [facility], [place] [address] [place], [place] [address]  CBC with Differential     Status: Abnormal   Collection Time: 03/01/19  3:14 PM  Result Value Ref Range   WBC 9.2 4.0 - 10.5 K/uL   RBC 3.94 (L) 4.22 - 5.81 MIL/uL   Hemoglobin 11.0 (L) 13.0 - 17.0 g/dL   HCT 32.9 (L) 39.0 - 52.0 %   MCV 83.5 80.0 - 100.0 fL   MCH 27.9 26.0 - 34.0 pg   MCHC 33.4 30.0 - 36.0 g/dL   RDW 13.2 11.5 - 15.5 %   Platelets 175 150 - 400 K/uL   nRBC 0.0 0.0 - 0.2 %   Neutrophils Relative % 66 %   Neutro Abs 6.1 1.7 - 7.7 K/uL   Lymphocytes Relative 18 %   Lymphs Abs 1.6 0.7 - 4.0 K/uL   Monocytes Relative 11 %   Monocytes Absolute 1.0 0.1 - 1.0 K/uL   Eosinophils Relative 4 %   Eosinophils Absolute 0.4 0.0 - 0.5 K/uL   Basophils Relative 0 %   Basophils Absolute 0.0 0.0 - 0.1 K/uL   Immature Granulocytes 1 %   Abs Immature Granulocytes 0.05 0.00 - 0.07 K/uL    Comment: Performed at [facility], [place] [address] [place], [place] [address]  Protime-INR     Status: None   Collection Time: 03/01/19  3:14 PM  Result Value Ref Range   Prothrombin Time 14.1 11.4 - 15.2 seconds   INR 1.1 0.8 - 1.2    Comment: (NOTE) INR goal varies based on device and disease states. Performed at [facility], [place] [address], [place], [place] [address]   Influenza panel by PCR (type A & B)     Status: None   Collection Time: 03/01/19  3:18 PM  Result  Value Ref Range   Influenza A By PCR NEGATIVE  NEGATIVE   Influenza B By PCR NEGATIVE NEGATIVE    Comment: (NOTE) The Xpert Xpress Flu assay is intended as an aid in the diagnosis of  influenza and should not be used as a sole basis for treatment.  This  assay is FDA approved for nasopharyngeal swab specimens only. Nasal  washings and aspirates are unacceptable for Xpert Xpress Flu testing. Performed at [facility], [place] [address], [place], [place] [address]   Magnesium     Status: None   Collection Time: 03/01/19  4:15 PM  Result Value Ref Range   Magnesium 2.0 1.7 - 2.4 mg/dL    Comment: Performed at [facility], [place] [address] [place], [place] [address]  Phosphorus     Status: Abnormal   Collection Time: 03/01/19  4:15 PM  Result Value Ref Range   Phosphorus 2.2 (L) 2.5 - 4.6 mg/dL    Comment: Performed at [facility], [place] [address] [place], [place] [address]   Dg Chest 2 View  Result Date: 03/01/2019 CLINICAL DATA:  Weakness EXAM: CHEST - 2 VIEW COMPARISON:  02/02/2018 FINDINGS: The heart size and mediastinal contours are within normal limits. Both lungs are clear. The visualized skeletal structures are unremarkable. IMPRESSION: No active cardiopulmonary disease. Electronically Signed   By: Ulyses Jarred M.D.   On: 03/01/2019 15:39   Ct Head Wo Contrast  Result Date: 03/01/2019 CLINICAL DATA:  58 year old male with extreme dizziness and weakness for 5 days. Frontal headache. History of prostate cancer. EXAM: CT HEAD WITHOUT CONTRAST TECHNIQUE: Contiguous axial images were obtained from the base of the skull through the vertex without intravenous contrast. COMPARISON:  Brain MRI 12/10/2017. FINDINGS: Brain: Stable cerebral volume since 2018. No midline shift, ventriculomegaly, mass effect, evidence of mass lesion, intracranial hemorrhage or evidence of cortically based acute infarction. Gray-white matter differentiation is within normal limits throughout the brain.  Vascular: Mild Calcified atherosclerosis at the skull base. No suspicious intracranial vascular hyperdensity. Skull: Within normal limits. Sinuses/Orbits: Visualized paranasal sinuses and mastoids are stable and well pneumatized. Small chronic mucous retention cyst in the left maxillary. Tympanic cavities are clear. Other: Postoperative changes to both globes since 2018. Visualized scalp soft tissues are within normal limits. IMPRESSION: Noncontrast CT appearance of the brain appears stable to the 2018 MRI and within normal limits for age. Negative head CT. Electronically Signed   By: Genevie Ann M.D.   On: 03/01/2019 17:23    Pending Labs Unresulted Labs (From admission, onward)    Start     Ordered   03/02/19 0500  CBC  Tomorrow morning,   R     03/01/19 1737   03/02/19 0500  Comprehensive metabolic panel  Tomorrow morning,   R     03/01/19 1737   03/01/19 1758  Expectorated sputum assessment w rflx to resp cult  Once,   R    Question:  Patient immune status  Answer:  Immunocompromised   03/01/19 1757   03/01/19 1737  Procalcitonin - Baseline  ONCE - STAT,   STAT     03/01/19 1736   03/01/19 1737  Lactic acid, plasma  STAT Now then every 3 hours,   R     03/01/19 1736   03/01/19 1734  HIV antibody (Routine Testing)  Once,   R     03/01/19 1734   03/01/19 6568  Basic metabolic panel  Once,  R    Comments:  After all k given po and iv    03/01/19 1732   03/01/19 1646  Respiratory Panel by PCR  (Respiratory virus panel with precautions)  Once,   R     03/01/19 1645   03/01/19 1451  Lactic acid, plasma  Now then every 2 hours,   STAT     03/01/19 1451   03/01/19 1451  Culture, blood (Routine x 2)  BLOOD CULTURE X 2,   STAT     03/01/19 1451          Vitals/Pain Today's Vitals   03/01/19 1600 03/01/19 1630 03/01/19 1650 03/01/19 1748  BP: (!) 160/82 (!) 153/64 (!) 152/78 (!) 153/82  Pulse: 61 65 63 70  Resp: 16 (!) 27 (!) 24 (!) 30  Temp:      TempSrc:      SpO2: 93% 94% 94% 92%   Weight:      Height:      PainSc:        Isolation Precautions Droplet precaution  Medications Medications  potassium chloride 10 mEq in 100 mL IVPB (10 mEq Intravenous New Bag/Given 03/01/19 1733)  potassium chloride SA (K-DUR,KLOR-CON) CR tablet 40 mEq (has no administration in time range)  0.9 %  sodium chloride infusion ( Intravenous New Bag/Given 03/01/19 1731)  potassium chloride SA (K-DUR,KLOR-CON) CR tablet 40 mEq (has no administration in time range)  enoxaparin (LOVENOX) injection 40 mg (has no administration in time range)  carvedilol (COREG) tablet 6.25 mg (has no administration in time range)  ipratropium-albuterol (DUONEB) 0.5-2.5 (3) MG/3ML nebulizer solution 3 mL (has no administration in time range)  guaiFENesin-codeine 100-10 MG/5ML solution 5 mL (has no administration in time range)  sodium chloride 0.9 % bolus 1,000 mL (0 mLs Intravenous Stopped 03/01/19 1729)  fentaNYL (SUBLIMAZE) injection 50 mcg (50 mcg Intravenous Given 03/01/19 1727)    Mobility walks Moderate fall risk   Focused Assessments Cardiac Assessment Handoff:    Lab Results  Component Value Date   CKTOTAL 443 (H) 07/31/2018   No results found for: DDIMER Does the Patient currently have chest pain? No     R Recommendations: See Admitting Provider Note  Report given to:   Additional Notes: low K

## 2019-03-01 NOTE — ED Triage Notes (Signed)
Pt c/o general weakness since 3/8.  Tested for flu 3/10- negative.  Pt appearing jaundice, barely able to stand and pivot.  Pt reports hx of Stage 4 prostate cancer, been receiving tx for appx 2 years.  Denies n/v/d.  C/o extreme dizziness. Reports poor PO intake since Sunday.    Pt reports home fever of 102.0 took Advil appx 40 min PTA.

## 2019-03-01 NOTE — ED Notes (Signed)
Bed: WA18 Expected date:  Expected time:  Means of arrival:  Comments: 

## 2019-03-01 NOTE — ED Notes (Signed)
Family at bedside. 

## 2019-03-01 NOTE — ED Provider Notes (Signed)
[facility] DEPT Provider Note   CSN: [id number] Arrival date & time: 03/01/19  1436    History   Chief Complaint Chief Complaint  Patient presents with  . Weakness    HPI Adam Cruz is a 58 y.o. male.     HPI Patient presents with generalized weakness.  Has had a cough that is nonproductive for the last couple days.  Began Sunday with today being Friday.  Developed fevers up to 102.  Went to PCP a few days ago and reportedly had a negative flu test.  Somewhat decreased oral intake.  No nausea or vomiting.  No sore throat.  States he can feel rattling around in his chest.  States he works from home so was not been out.  He does have stage IV prostate cancer and feels the way does when he gets his Lupron shots with states he has not had a recent Lupron shot. Past Medical History:  Diagnosis Date  . Diabetes mellitus without complication ([place])   . Family history of breast cancer   . Family history of breast cancer in male   . Family history of pancreatic cancer   . Hyperlipidemia   . Hypertension   . MI (myocardial infarction) ([place])   . Prostate cancer ([place]) dx'd 06/2017    Patient Active Problem List   Diagnosis Date Noted  . Hypokalemia 03/01/2019  . COPD with acute bronchitis ([place]) 03/01/2019  . Prostate cancer ([place]) 03/01/2019  . Upper respiratory tract infection   . Genetic testing 09/08/2017  . BRCA2 positive 09/08/2017  . Family history of breast cancer   . Family history of breast cancer in male   . Family history of pancreatic cancer   . Malignant neoplasm of prostate ([place]) 07/28/2017    Past Surgical History:  Procedure Laterality Date  . APPENDECTOMY    . CARDIAC CATHETERIZATION    . PROSTATE BIOPSY    . SHOULDER SURGERY          Home Medications    Prior to Admission medications   Medication Sig Start Date End Date Taking? Authorizing Provider  abiraterone acetate (ZYTIGA) 250 MG tablet Take 3 tablets (750 mg  total) by mouth daily. Take on an empty stomach 1 hour before or 2 hours after a meal 06/06/18  Yes Shadad, Mathis Dad, MD  amitriptyline (ELAVIL) 25 MG tablet Take 25 mg by mouth every evening.  09/14/16  Yes [provider]  amLODipine (NORVASC) 5 MG tablet Take 5 mg by mouth daily. 02/21/19  Yes [provider]  ASPIRIN 81 PO Take 81 mg by mouth at bedtime.  05/07/17  Yes [provider]  carvedilol (COREG) 25 MG tablet Take 25 mg by mouth 2 (two) times daily. 02/21/19  Yes [provider]  Cholecalciferol (VITAMIN D3) 5000 units CAPS Take 1 capsule by mouth daily.  09/27/16  Yes [provider]  fluticasone (FLONASE) 50 MCG/ACT nasal spray Place 1 spray into both nostrils daily. 02/11/19  Yes [provider]  Gabapentin Enacarbil (HORIZANT) 600 MG TBCR Take 600 mg by mouth 2 (two) times daily.  09/01/16  Yes [provider]  Insulin Glargine-Lixisenatide 100-33 UNT-MCG/ML SOPN Inject 65 Units into the skin daily.    Yes [provider]  JARDIANCE 25 MG TABS tablet Take 25 mg by mouth daily.  02/16/18  Yes [provider]  levothyroxine (SYNTHROID, LEVOTHROID) 112 MCG tablet Take 112 mcg by mouth daily. 02/09/19  Yes [provider]  lisinopril (PRINIVIL,ZESTRIL) 40 MG tablet Take 40 mg by mouth daily. 02/26/19  Yes [provider]  NOVOLOG FLEXPEN 100 UNIT/ML FlexPen Inject 12 Units into the skin daily as needed. 02/19/19  Yes [provider]  omeprazole (PRILOSEC) 40 MG capsule Take 40 mg by mouth daily.  09/14/16  Yes [provider]  oseltamivir (TAMIFLU) 75 MG capsule Take 75 mg by mouth 2 (two) times daily. 02/25/19  Yes [provider]  potassium chloride SA (K-DUR,KLOR-CON) 20 MEQ tablet Take 1 tablet (20 mEq total) by mouth daily. 10/19/17  Yes Wyatt Portela, MD  rosuvastatin (CRESTOR) 40 MG tablet  09/02/16  Yes [provider]  ticagrelor (BRILINTA) 90 MG TABS tablet Take  90 mg by mouth 2 (two) times daily.  09/17/13  Yes [provider]  ZYTIGA 250 MG tablet TAKE 3 TABLETS BY MOUTH EVERY DAY. TAKE ON AN EMPTY STOMACH 1 HOUR BEFORE OR 2 HOURS AFTER A MEAL Patient not taking: Reported on 03/01/2019 02/05/19   Wyatt Portela, MD    Family History Family History  Problem Relation Age of Onset  . Breast cancer Mother 13  . Pancreatic cancer Mother 23  . Breast cancer Maternal Grandfather        dx in his 68s  . Breast cancer Daughter 42       reportedly BRCA pos  . Cirrhosis Father 72       non alcoholic cirrhosis  . Dementia Maternal Grandmother   . Dementia Paternal Aunt     Social History Social History   Tobacco Use  . Smoking status: Never Smoker  . Smokeless tobacco: Never Used  Substance Use Topics  . Alcohol use: No  . Drug use: No     Allergies   Patient has no known allergies.   Review of Systems Review of Systems  Constitutional: Positive for appetite change, fatigue and fever.  HENT: Negative for congestion and sore throat.   Respiratory: Positive for cough and shortness of breath.   Cardiovascular: Negative for chest pain.  Gastrointestinal: Negative for abdominal pain.  Genitourinary: Negative for flank pain.  Musculoskeletal: Negative for back pain.  Skin: Negative for rash and wound.  Neurological: Positive for weakness.  Hematological: Negative for adenopathy.  Psychiatric/Behavioral: Negative for confusion.     Physical Exam Updated Vital Signs BP (!) 156/88 (BP Location: Right Arm)   Pulse 69   Temp 98.4 F (36.9 C) (Oral)   Resp (!) 28   Ht 6' (1.829 m)   Wt 96.2 kg   SpO2 90%   BMI 28.75 kg/m   Physical Exam HENT:     Head: Atraumatic.     Mouth/Throat:     Mouth: Mucous membranes are dry.  Cardiovascular:     Rate and Rhythm: Regular rhythm.  Pulmonary:     Comments: Mildly harsh breath sounds without focal rales or rhonchi. Chest:     Chest wall: No tenderness.  Abdominal:      Tenderness: There is no abdominal tenderness.  Musculoskeletal:     Right lower leg: No edema.     Left lower leg: No edema.  Skin:    General: Skin is warm.     Capillary Refill: Capillary refill takes less than 2 seconds.  Neurological:     General: No focal deficit present.     Mental Status: He is alert.  Psychiatric:        Mood and Affect: Mood normal.  ED Treatments / Results  Labs (all labs ordered are listed, but only abnormal results are displayed) Labs Reviewed  COMPREHENSIVE METABOLIC PANEL - Abnormal; Notable for the following components:      Result Value   Potassium <2.0 (*)    Glucose, Bld 159 (*)    Calcium 8.5 (*)    Albumin 3.2 (*)    AST 42 (*)    All other components within normal limits  CBC WITH DIFFERENTIAL/PLATELET - Abnormal; Notable for the following components:   RBC 3.94 (*)    Hemoglobin 11.0 (*)    HCT 32.9 (*)    All other components within normal limits  URINALYSIS, ROUTINE W REFLEX MICROSCOPIC - Abnormal; Notable for the following components:   Specific Gravity, Urine 1.032 (*)    Glucose, UA >=500 (*)    Hgb urine dipstick MODERATE (*)    Protein, ur 100 (*)    All other components within normal limits  PHOSPHORUS - Abnormal; Notable for the following components:   Phosphorus 2.2 (*)    All other components within normal limits  BASIC METABOLIC PANEL - Abnormal; Notable for the following components:   Potassium 2.3 (*)    Glucose, Bld 148 (*)    All other components within normal limits  GLUCOSE, CAPILLARY - Abnormal; Notable for the following components:   Glucose-Capillary 158 (*)    All other components within normal limits  RESPIRATORY PANEL BY PCR  MRSA PCR SCREENING  CULTURE, BLOOD (ROUTINE X 2)  CULTURE, BLOOD (ROUTINE X 2)  EXPECTORATED SPUTUM ASSESSMENT W REFEX TO RESP CULTURE  LACTIC ACID, PLASMA  INFLUENZA PANEL BY PCR (TYPE A & B)  PROTIME-INR  MAGNESIUM  PROCALCITONIN  LACTIC ACID, PLASMA  HIV ANTIBODY  (ROUTINE TESTING W REFLEX)  CBC  COMPREHENSIVE METABOLIC PANEL  BASIC METABOLIC PANEL  MAGNESIUM    EKG None  Radiology Dg Chest 2 View  Result Date: 03/01/2019 CLINICAL DATA:  Weakness EXAM: CHEST - 2 VIEW COMPARISON:  02/02/2018 FINDINGS: The heart size and mediastinal contours are within normal limits. Both lungs are clear. The visualized skeletal structures are unremarkable. IMPRESSION: No active cardiopulmonary disease. Electronically Signed   By: Ulyses Jarred M.D.   On: 03/01/2019 15:39   Ct Head Wo Contrast  Result Date: 03/01/2019 CLINICAL DATA:  58 year old male with extreme dizziness and weakness for 5 days. Frontal headache. History of prostate cancer. EXAM: CT HEAD WITHOUT CONTRAST TECHNIQUE: Contiguous axial images were obtained from the base of the skull through the vertex without intravenous contrast. COMPARISON:  Brain MRI 12/10/2017. FINDINGS: Brain: Stable cerebral volume since 2018. No midline shift, ventriculomegaly, mass effect, evidence of mass lesion, intracranial hemorrhage or evidence of cortically based acute infarction. Gray-white matter differentiation is within normal limits throughout the brain. Vascular: Mild Calcified atherosclerosis at the skull base. No suspicious intracranial vascular hyperdensity. Skull: Within normal limits. Sinuses/Orbits: Visualized paranasal sinuses and mastoids are stable and well pneumatized. Small chronic mucous retention cyst in the left maxillary. Tympanic cavities are clear. Other: Postoperative changes to both globes since 2018. Visualized scalp soft tissues are within normal limits. IMPRESSION: Noncontrast CT appearance of the brain appears stable to the 2018 MRI and within normal limits for age. Negative head CT. Electronically Signed   By: Genevie Ann M.D.   On: 03/01/2019 17:23    Procedures Procedures (including critical care time)  Medications Ordered in ED Medications  potassium chloride 10 mEq in 100 mL IVPB (10 mEq  Intravenous New Bag/Given 03/01/19  2314)  potassium chloride SA (K-DUR,KLOR-CON) CR tablet 40 mEq (40 mEq Oral Not Given 03/01/19 1944)  0.9 %  sodium chloride infusion ( Intravenous Rate/Dose Verify 03/01/19 2200)  enoxaparin (LOVENOX) injection 40 mg (40 mg Subcutaneous Given 03/01/19 2151)  carvedilol (COREG) tablet 6.25 mg (6.25 mg Oral Given 03/01/19 2028)  guaiFENesin-codeine 100-10 MG/5ML solution 5 mL (5 mLs Oral Given 03/01/19 2048)  vancomycin (VANCOCIN) 2,000 mg in sodium chloride 0.9 % 500 mL IVPB (2,000 mg Intravenous New Bag/Given 03/01/19 2222)  vancomycin (VANCOCIN) 1,500 mg in sodium chloride 0.9 % 500 mL IVPB (has no administration in time range)  ceFEPIme (MAXIPIME) 2 g in sodium chloride 0.9 % 100 mL IVPB ( Intravenous Stopped 03/01/19 2152)  amitriptyline (ELAVIL) tablet 25 mg (25 mg Oral Given 03/01/19 2029)  amLODipine (NORVASC) tablet 5 mg (5 mg Oral Given 03/01/19 2029)  fluticasone (FLONASE) 50 MCG/ACT nasal spray 1 spray (1 spray Each Nare Given 03/01/19 2052)  levothyroxine (SYNTHROID, LEVOTHROID) tablet 112 mcg (has no administration in time range)  ticagrelor (BRILINTA) tablet 90 mg (90 mg Oral Given 03/01/19 2256)  insulin aspart (novoLOG) injection 0-9 Units (has no administration in time range)  insulin aspart (novoLOG) injection 0-5 Units (0 Units Subcutaneous Not Given 03/01/19 2234)  acetaminophen (TYLENOL) tablet 650 mg (650 mg Oral Given 03/01/19 2029)  MEDLINE mouth rinse (15 mLs Mouth Rinse Given 03/01/19 2258)  albuterol (PROVENTIL) (2.5 MG/3ML) 0.083% nebulizer solution 2.5 mg (has no administration in time range)  sodium chloride 0.9 % bolus 1,000 mL (0 mLs Intravenous Stopped 03/01/19 1729)  fentaNYL (SUBLIMAZE) injection 50 mcg (50 mcg Intravenous Given 03/01/19 1727)  potassium chloride SA (K-DUR,KLOR-CON) CR tablet 40 mEq (40 mEq Oral Given 03/01/19 2151)     Initial Impression / Assessment and Plan / ED Course  I have reviewed the triage vital signs and the  nursing notes.  Pertinent labs & imaging results that were available during my care of the patient were reviewed by me and considered in my medical decision making (see chart for details).        Patient presents with URI type symptoms.  Fever of 102 at home.  X-ray reassuring.  Negative flu test.  Not hypoxic, however patient does have a potassium of less than 2.  His Zytiga can reportedly cause hypokalemia and I think is likely the cause.  He has had somewhat poor intake.  Respiratory panel now added.  Will supplement potassium.  Magnesium and phosphorus added.  Will admit to hospitalist.  CRITICAL CARE Performed by: Davonna Belling Total critical care time: 30 minutes Critical care time was exclusive of separately billable procedures and treating other patients. Critical care was necessary to treat or prevent imminent or life-threatening deterioration. Critical care was time spent personally by me on the following activities: development of treatment plan with patient and/or surrogate as well as nursing, discussions with consultants, evaluation of patient's response to treatment, examination of patient, obtaining history from patient or surrogate, ordering and performing treatments and interventions, ordering and review of laboratory studies, ordering and review of radiographic studies, pulse oximetry and re-evaluation of patient's condition.  Final Clinical Impressions(s) / ED Diagnoses   Final diagnoses:  Hypokalemia  Upper respiratory tract infection, unspecified type    ED Discharge Orders    None       Davonna Belling, MD 03/01/19 2357

## 2019-03-01 NOTE — H&P (Signed)
History and Physical    Adam Cruz EXB:[id number] DOB: 16-Dec-1961 DOA: 03/01/2019  PCP: [name], MD Patient coming from: Home  Chief Complaint: Generalized weakness and fever  HPI: Adam Cruz is a 58 y.o. male with medical history significant of stage IV prostate cancer on Zytiga followed by Dr. [name] with complaints of 1 week history of fever shortness of breath cough.  He went to see his primary care physician and did a flu test which was negative.  Patient was given prescription for Tamiflu which he took the full 5-day course.  He continues to have generalized aches and pains along with fever of 102.1 with persistent cough prior to coming into the ER.  He has decreased appetite and has been having less p.o. intake unable to ambulate at home due to generalized weakness he lives at home with his wife.  He works from home.  He has not traveled outside or internationally.  He denies any vomiting occasional nausea reported denies diarrhea abdominal pain. Wife also reported that he is talking confused and out of his head he is seeing things that is not there.    ED Course: His K was found to be less than 2 for runs of 10 mg potassium were ordered in the ED.  IV fluids were started.  CT of the head showed no acute abnormality.  Chest x-ray showed no acute findings.  Functions normal.  Review of Systems: As per HPI otherwise all other systems reviewed and are negative  Ambulatory Status: Ambulatory at baseline.  Past Medical History:  Diagnosis Date  . Diabetes mellitus without complication ([place])   . Family history of breast cancer   . Family history of breast cancer in male   . Family history of pancreatic cancer   . Hyperlipidemia   . Hypertension   . MI (myocardial infarction) ([place])   . Prostate cancer ([place]) dx'd 06/2017    Past Surgical History:  Procedure Laterality Date  . APPENDECTOMY    . CARDIAC CATHETERIZATION    . PROSTATE BIOPSY    . SHOULDER SURGERY       Social History   Socioeconomic History  . Marital status: Married    Spouse name: Not on file  . Number of children: Not on file  . Years of education: Not on file  . Highest education level: Not on file  Occupational History  . Not on file  Social Needs  . Financial resource strain: Not on file  . Food insecurity:    Worry: Not on file    Inability: Not on file  . Transportation needs:    Medical: Not on file    Non-medical: Not on file  Tobacco Use  . Smoking status: Never Smoker  . Smokeless tobacco: Never Used  Substance and Sexual Activity  . Alcohol use: No  . Drug use: No  . Sexual activity: Never  Lifestyle  . Physical activity:    Days per week: Not on file    Minutes per session: Not on file  . Stress: Not on file  Relationships  . Social connections:    Talks on phone: Not on file    Gets together: Not on file    Attends religious service: Not on file    Active member of club or organization: Not on file    Attends meetings of clubs or organizations: Not on file    Relationship status: Not on file  . Intimate partner violence:  Fear of current or ex partner: Not on file    Emotionally abused: Not on file    Physically abused: Not on file    Forced sexual activity: Not on file  Other Topics Concern  . Not on file  Social History Narrative  . Not on file    No Known Allergies  Family History  Problem Relation Age of Onset  . Breast cancer Mother 58  . Pancreatic cancer Mother 55  . Breast cancer Maternal Grandfather        dx in his 57s  . Breast cancer Daughter 78       reportedly BRCA pos  . Cirrhosis Father 73       non alcoholic cirrhosis  . Dementia Maternal Grandmother   . Dementia Paternal Aunt       Prior to Admission medications   Medication Sig Start Date End Date Taking? Authorizing Provider  abiraterone acetate (ZYTIGA) 250 MG tablet Take 3 tablets (750 mg total) by mouth daily. Take on an empty stomach 1 hour before or 2  hours after a meal 06/06/18   Wyatt Portela, MD  amitriptyline (ELAVIL) 25 MG tablet  09/14/16   [provider]  ASPIRIN 81 PO TK 1 T PO QD 05/07/17   [provider]  carvedilol (COREG) 6.25 MG tablet  02/05/18   [provider]  Cholecalciferol (VITAMIN D3) 5000 units CAPS TAKE 1 BY MOUTH DAILY 09/27/16   [provider]  Gabapentin Enacarbil (HORIZANT) 600 MG TBCR TK 1 T PO BID 09/01/16   [provider]  glimepiride (AMARYL) 4 MG tablet TK 1 T PO QD 07/03/17   [provider]  GLIPIZIDE PO Take by mouth.    [provider]  Insulin Glargine-Lixisenatide 100-33 UNT-MCG/ML SOPN Inject into the skin.    [provider]  JARDIANCE 25 MG TABS tablet TK 1 T PO IN THE MORNING 02/16/18   [provider]  levothyroxine (SYNTHROID, LEVOTHROID) 125 MCG tablet  09/02/16   [provider]  lisinopril (PRINIVIL,ZESTRIL) 20 MG tablet TK 1 T PO D 02/05/18   [provider]  omeprazole (PRILOSEC) 40 MG capsule TAKE 1 CAPSULE BY MOUTH ONCE DAILY BEFORE A MEAL IN COMBINATION WITH CLARITHROMYCIN 09/14/16   [provider]  potassium chloride SA (K-DUR,KLOR-CON) 20 MEQ tablet Take 1 tablet (20 mEq total) by mouth daily. 10/19/17   Wyatt Portela, MD  rosuvastatin (CRESTOR) 40 MG tablet  09/02/16   [provider]  ticagrelor (BRILINTA) 90 MG TABS tablet Take by mouth. 09/17/13   [provider]  ZYTIGA 250 MG tablet TAKE 3 TABLETS BY MOUTH EVERY DAY. TAKE ON AN EMPTY STOMACH 1 HOUR BEFORE OR 2 HOURS AFTER A MEAL 02/05/19   Wyatt Portela, MD    Physical Exam: Vitals:   03/01/19 1545 03/01/19 1600 03/01/19 1630 03/01/19 1650  BP:  (!) 160/82 (!) 153/64 (!) 152/78  Pulse: 65 61 65 63  Resp: (!) 26 16 (!) 27 (!) 30  Temp:      TempSrc:      SpO2: 95% 93%  94%  Weight:      Height:         . General:  Appears in mild distress due to cough and shortness of breath . Eyes:  PERRL, EOMI,  normal lids, iris . ENT:  grossly normal hearing, lips & tongue, mmm . Neck: no LAD, masses or thyromegaly . Cardiovascular:  RRR, no m/r/g. No  LE edema.  Marland Kitchen Respiratory: Bilateral scattered rhonchi in both lung fields bilaterally, no w/r/r. Normal respiratory effort. . Abdomen:  soft, ntnd, NABS . Skin:  no rash or induration seen on limited exam . Musculoskeletal:  grossly normal tone BUE/BLE, good ROM, no bony abnormality . Psychiatric: grossly normal mood and affect, speech fluent and appropriate, AOx3 . Neurologic: CN 2-12 grossly intact, moves all extremities in coordinated fashion, sensation intact  Labs on Admission: I have personally reviewed following labs and imaging studies  CBC: Recent Labs  Lab 03/01/19 1514  WBC 9.2  NEUTROABS 6.1  HGB 11.0*  HCT 32.9*  MCV 83.5  PLT 174   Basic Metabolic Panel: Recent Labs  Lab 03/01/19 1514 03/01/19 1615  NA 141  --   K <2.0*  --   CL 104  --   CO2 27  --   GLUCOSE 159*  --   BUN 12  --   CREATININE 0.89  --   CALCIUM 8.5*  --   MG  --  2.0  PHOS  --  2.2*   GFR: Estimated Creatinine Clearance: 110.1 mL/min (by C-G formula based on SCr of 0.89 mg/dL). Liver Function Tests: Recent Labs  Lab 03/01/19 1514  AST 42*  ALT 26  ALKPHOS 86  BILITOT 0.8  PROT 6.5  ALBUMIN 3.2*   No results for input(s): LIPASE, AMYLASE in the last 168 hours. No results for input(s): AMMONIA in the last 168 hours. Coagulation Profile: Recent Labs  Lab 03/01/19 1514  INR 1.1   Cardiac Enzymes: No results for input(s): CKTOTAL, CKMB, CKMBINDEX, TROPONINI in the last 168 hours. BNP (last 3 results) No results for input(s): PROBNP in the last 8760 hours. HbA1C: No results for input(s): HGBA1C in the last 72 hours. CBG: No results for input(s): GLUCAP in the last 168 hours. Lipid Profile: No results for input(s): CHOL, HDL, LDLCALC, TRIG, CHOLHDL, LDLDIRECT in the last 72 hours. Thyroid Function Tests: No results for input(s):  TSH, T4TOTAL, FREET4, T3FREE, THYROIDAB in the last 72 hours. Anemia Panel: No results for input(s): VITAMINB12, FOLATE, FERRITIN, TIBC, IRON, RETICCTPCT in the last 72 hours. Urine analysis:    Component Value Date/Time   COLORURINE YELLOW 03/01/2019 1451   APPEARANCEUR CLEAR 03/01/2019 1451   LABSPEC 1.032 (H) 03/01/2019 1451   PHURINE 6.0 03/01/2019 1451   GLUCOSEU >=500 (A) 03/01/2019 1451   HGBUR MODERATE (A) 03/01/2019 1451   BILIRUBINUR NEGATIVE 03/01/2019 1451   KETONESUR NEGATIVE 03/01/2019 1451   PROTEINUR 100 (A) 03/01/2019 1451   NITRITE NEGATIVE 03/01/2019 1451   LEUKOCYTESUR NEGATIVE 03/01/2019 1451    Creatinine Clearance: Estimated Creatinine Clearance: 110.1 mL/min (by C-G formula based on SCr of 0.89 mg/dL).  Sepsis Labs: _0 (procalcitonin:4,lacticidven:4) )No results found for this or any previous visit (from the past 240 hour(s)).   Radiological Exams on Admission: Dg Chest 2 View  Result Date: 03/01/2019 CLINICAL DATA:  Weakness EXAM: CHEST - 2 VIEW COMPARISON:  02/02/2018 FINDINGS: The heart size and mediastinal contours are within normal limits. Both lungs are clear. The visualized skeletal structures are unremarkable. IMPRESSION: No active cardiopulmonary disease. Electronically Signed   By: Ulyses Jarred M.D.   On: 03/01/2019 15:39   Ct Head Wo Contrast  Result Date: 03/01/2019 CLINICAL DATA:  58 year old male with extreme dizziness and weakness for 5 days. Frontal headache. History of prostate cancer. EXAM: CT HEAD WITHOUT CONTRAST TECHNIQUE: Contiguous axial images were obtained from the base of the skull through the vertex without intravenous  contrast. COMPARISON:  Brain MRI 12/10/2017. FINDINGS: Brain: Stable cerebral volume since 2018. No midline shift, ventriculomegaly, mass effect, evidence of mass lesion, intracranial hemorrhage or evidence of cortically based acute infarction. Gray-white matter differentiation is within normal limits  throughout the brain. Vascular: Mild Calcified atherosclerosis at the skull base. No suspicious intracranial vascular hyperdensity. Skull: Within normal limits. Sinuses/Orbits: Visualized paranasal sinuses and mastoids are stable and well pneumatized. Small chronic mucous retention cyst in the left maxillary. Tympanic cavities are clear. Other: Postoperative changes to both globes since 2018. Visualized scalp soft tissues are within normal limits. IMPRESSION: Noncontrast CT appearance of the brain appears stable to the 2018 MRI and within normal limits for age. Negative head CT. Electronically Signed   By: Genevie Ann M.D.   On: 03/01/2019 17:23     Assessment/Plan Active Problems:   * No active hospital problems. *    #1 fever patient admitted with complaints of high-grade fever 102.1 at home along with persistent cough productive shortness of breath and generalized body aches and pains.  His flu test was negative at his PCP office.  Repeat flu test done here was also negative.  He received a course of Tamiflu this week.  Respiratory virus panel has been sent is pending at this time.  I will obtain sputum culture procalcitonin and lactic acid.  I will repeat chest x-ray tomorrow after IV hydration.  In view of his immunosuppression fever productive cough I will cover him with antibiotics and monitor closely.  #2 severe hypokalemia potassium less than 2 with normal magnesium and normal phosphorus.  This is thought to be secondary to [facility].  Will replace potassium aggressively monitor on telemetry.  Recheck potassium after replacement tonight.  #3 stage IV prostate cancer followed by Dr. [name] on [place].  #4 type 2 diabetes patient takes Amaryl insulin glargine Jardiance at home I will cover him with sliding scale.  He also take glipizide at home.  #5 essential hypertension I will restart Norvasc and Coreg.  He is also on lisinopril 40 mg daily at home.  #6 hypothyroidism continue Synthroid.  #7  hyperlipidemia continue Crestor  #8 CAD continue Brilinta.  And aspirin  Estimated body mass index is 28.75 kg/m as calculated from the following:   Height as of this encounter: 6' (1.829 m).   Weight as of this encounter: 96.2 kg.   DVT prophylaxis: Lovenox Code Status: Full code Family Communication: Discussed with wife Disposition Plan: Pending clinical improvement Consults called: None Admission status: Inpatient   Adam Shell MD [facility]  If 7PM-7AM, please contact night-coverage [email or web address] Password [facility]  03/01/2019, 5:30 PM

## 2019-03-02 ENCOUNTER — Inpatient Hospital Stay (HOSPITAL_COMMUNITY): Payer: BLUE CROSS/BLUE SHIELD

## 2019-03-02 DIAGNOSIS — R059 Cough, unspecified: Secondary | ICD-10-CM

## 2019-03-02 DIAGNOSIS — R05 Cough: Secondary | ICD-10-CM

## 2019-03-02 LAB — COMPREHENSIVE METABOLIC PANEL
ALT: 25 U/L (ref 0–44)
AST: 38 U/L (ref 15–41)
Albumin: 2.9 g/dL — ABNORMAL LOW (ref 3.5–5.0)
Alkaline Phosphatase: 81 U/L (ref 38–126)
Anion gap: 11 (ref 5–15)
BUN: 10 mg/dL (ref 6–20)
CO2: 22 mmol/L (ref 22–32)
CREATININE: 0.79 mg/dL (ref 0.61–1.24)
Calcium: 8.1 mg/dL — ABNORMAL LOW (ref 8.9–10.3)
Chloride: 106 mmol/L (ref 98–111)
GFR calc Af Amer: >60 mL/min (ref >60)
GLUCOSE: 112 mg/dL — AB (ref 70–99)
Potassium: 2.3 mmol/L — CL (ref 3.5–5.1)
Sodium: 139 mmol/L (ref 135–145)
Total Bilirubin: 0.9 mg/dL (ref 0.3–1.2)
Total Protein: 6 g/dL — ABNORMAL LOW (ref 6.5–8.1)

## 2019-03-02 LAB — EXPECTORATED SPUTUM ASSESSMENT W GRAM STAIN, RFLX TO RESP C

## 2019-03-02 LAB — BASIC METABOLIC PANEL
Anion gap: 12 (ref 5–15)
Anion gap: 14 (ref 5–15)
BUN: 9 mg/dL (ref 6–20)
BUN: 8 mg/dL (ref 6–20)
CO2: 22 mmol/L (ref 22–32)
CO2: 18 mmol/L — ABNORMAL LOW (ref 22–32)
Calcium: 8.6 mg/dL — ABNORMAL LOW (ref 8.9–10.3)
Calcium: 8.3 mg/dL — ABNORMAL LOW (ref 8.9–10.3)
Chloride: 105 mmol/L (ref 98–111)
Chloride: 106 mmol/L (ref 98–111)
Creatinine, Ser: 0.86 mg/dL (ref 0.61–1.24)
Creatinine, Ser: 0.88 mg/dL (ref 0.61–1.24)
GFR calc Af Amer: >60 mL/min (ref >60)
GFR calc Af Amer: >60 mL/min (ref >60)
GFR calc non Af Amer: >60 mL/min (ref >60)
GFR calc non Af Amer: >60 mL/min (ref >60)
Glucose, Bld: 110 mg/dL — ABNORMAL HIGH (ref 70–99)
Glucose, Bld: 120 mg/dL — ABNORMAL HIGH (ref 70–99)
POTASSIUM: 3.2 mmol/L — AB (ref 3.5–5.1)
Potassium: 2.4 mmol/L — CL (ref 3.5–5.1)
Sodium: 139 mmol/L (ref 135–145)
Sodium: 138 mmol/L (ref 135–145)

## 2019-03-02 LAB — GLUCOSE, CAPILLARY
Glucose-Capillary: 148 mg/dL — ABNORMAL HIGH (ref 70–99)
Glucose-Capillary: 154 mg/dL — ABNORMAL HIGH (ref 70–99)
Glucose-Capillary: 162 mg/dL — ABNORMAL HIGH (ref 70–99)
Glucose-Capillary: 148 mg/dL — ABNORMAL HIGH (ref 70–99)

## 2019-03-02 LAB — CBC
HCT: 35.1 % — ABNORMAL LOW (ref 39.0–52.0)
Hemoglobin: 11.4 g/dL — ABNORMAL LOW (ref 13.0–17.0)
MCH: 27.5 pg (ref 26.0–34.0)
MCHC: 32.5 g/dL (ref 30.0–36.0)
MCV: 84.8 fL (ref 80.0–100.0)
Platelets: 147 10*3/uL — ABNORMAL LOW (ref 150–400)
RBC: 4.14 MIL/uL — ABNORMAL LOW (ref 4.22–5.81)
RDW: 13.2 % (ref 11.5–15.5)
WBC: 10.3 10*3/uL (ref 4.0–10.5)
nRBC: 0 % (ref 0.0–0.2)

## 2019-03-02 LAB — HIV ANTIBODY (ROUTINE TESTING W REFLEX): HIV Screen 4th Generation wRfx: NONREACTIVE

## 2019-03-02 LAB — MAGNESIUM
Magnesium: 1.9 mg/dL (ref 1.7–2.4)
Magnesium: 1.9 mg/dL (ref 1.7–2.4)

## 2019-03-02 MED ORDER — POTASSIUM CHLORIDE CRYS ER 20 MEQ PO TBCR
40.0000 meq | EXTENDED_RELEASE_TABLET | Freq: Three times a day (TID) | ORAL | Status: DC
  Administered 2019-03-02 (×3): 40 meq via ORAL
  Filled 2019-03-02 (×3): qty 2

## 2019-03-02 MED ORDER — POTASSIUM CHLORIDE CRYS ER 20 MEQ PO TBCR
40.0000 meq | EXTENDED_RELEASE_TABLET | ORAL | Status: AC
  Administered 2019-03-02: 40 meq via ORAL
  Filled 2019-03-02: qty 2

## 2019-03-02 MED ORDER — TRAZODONE HCL 50 MG PO TABS
50.0000 mg | ORAL_TABLET | Freq: Every day | ORAL | Status: DC
  Administered 2019-03-02: 50 mg via ORAL
  Filled 2019-03-02: qty 1

## 2019-03-02 MED ORDER — ALBUTEROL SULFATE (2.5 MG/3ML) 0.083% IN NEBU: 2.5000 mg | INHALATION_SOLUTION | RESPIRATORY_TRACT | Status: DC | PRN

## 2019-03-02 MED ORDER — POTASSIUM CHLORIDE 10 MEQ/100ML IV SOLN
10.0000 meq | INTRAVENOUS | Status: AC
  Administered 2019-03-02 (×2): 10 meq via INTRAVENOUS
  Filled 2019-03-02: qty 100

## 2019-03-02 MED ORDER — POTASSIUM CHLORIDE CRYS ER 20 MEQ PO TBCR
20.0000 meq | EXTENDED_RELEASE_TABLET | Freq: Once | ORAL | Status: AC
  Administered 2019-03-02: 20 meq via ORAL
  Filled 2019-03-02: qty 1

## 2019-03-02 MED ORDER — POTASSIUM CHLORIDE 10 MEQ/100ML IV SOLN
10.0000 meq | INTRAVENOUS | Status: AC
  Administered 2019-03-02 (×4): 10 meq via INTRAVENOUS
  Filled 2019-03-02 (×5): qty 100

## 2019-03-02 MED ORDER — IPRATROPIUM-ALBUTEROL 0.5-2.5 (3) MG/3ML IN SOLN
3.0000 mL | Freq: Two times a day (BID) | RESPIRATORY_TRACT | Status: DC
  Administered 2019-03-02 – 2019-03-04 (×5): 3 mL via RESPIRATORY_TRACT
  Filled 2019-03-02 (×5): qty 3

## 2019-03-02 NOTE — Progress Notes (Signed)
CRITICAL VALUE ALERT  Critical Value: K 2.3  Date & Time Notied:  03/02/19 0300  Provider Notified: Sherral Hammers  Orders Received/Actions taken: PO and IV K ordered. See MAR.

## 2019-03-02 NOTE — Progress Notes (Signed)
CRITICAL VALUE ALERT  Critical Value: Potassium 2.4  Date & Time Notied:  03/02/2019 @ 1255  Provider Notified: yes  Orders Received/Actions taken: yes

## 2019-03-02 NOTE — Progress Notes (Signed)
PROGRESS NOTE    Adam LEPERA  TML:[id number] DOB: March 14, 1961 DOA: 03/01/2019 PCP: [name], MD   Brief Narrative: 58 y.o. male with medical history significant of stage IV prostate cancer on Zytiga followed by Dr. [name] with complaints of 1 week history of fever shortness of breath cough.  He went to see his primary care physician and did a flu test which was negative.  Patient was given prescription for Tamiflu which he took the full 5-day course.  He continues to have generalized aches and pains along with fever of 102.1 with persistent cough prior to coming into the ER.  He has decreased appetite and has been having less p.o. intake unable to ambulate at home due to generalized weakness he lives at home with his wife.  He works from home.  He has not traveled outside or internationally.  He denies any vomiting occasional nausea reported denies diarrhea abdominal pain. Wife also reported that he is talking confused and out of his head he is seeing things that is not there. ED Course: His K was found to be less than 2  four runs of 10 mg potassium were ordered in the ED.  IV fluids were started.  CT of the head showed no acute abnormality.  Chest x-ray showed no acute findings.  Functions normal  Assessment & Plan:   Active Problems:   Hypokalemia   COPD with acute bronchitis ([place])   Prostate cancer ([place])    #1  Acute hypoxic respiratory failure secondary to community-acquired pneumonia- patient admitted with complaints of high-grade fever 102.1 at home along with persistent cough productive shortness of breath and generalized body aches and pains.  His flu test was negative at his PCP office.  Repeat flu test done here was also negative.  He received a course of Tamiflu this week.  Respiratory virus panel negative.  MRSA PCR negative.  Vancomycin DC'd.  Continue cefepime.  Patient has  immunocompromise state due to prostate cancer and recent chemotherapy.  Repeat chest x-ray done  today after hydration shows bibasilar infiltrates consistent with pneumonia.  Reports feeling better fever curve is coming down he has been afebrile for the last 24 hours.  His wife reported to me that 2 weeks ago they went to a church where there was a man who is visited from [place] but he was 30 to 40 feet away from them.  And that man has been doing well with no issues.  #2 severe hypokalemia potassium 2.3 continue repletion.   #3 stage IV prostate cancer followed by Dr. [name] on [place].  #4 type 2 diabetes patient takes Amaryl insulin glargine Jardiance at home I will cover him with sliding scale.  He also take glipizide at home.  Sugar stable  #5 essential hypertension I will restart Norvasc and Coreg.  He is also on lisinopril 40 mg daily at home.  #6 hypothyroidism continue Synthroid.  #7 hyperlipidemia continue Crestor  #8 CAD continue Brilinta.  And aspirin  DVT prophylaxis: Lovenox Code Status: Full code Family Communication: Discussed with wife Disposition Plan: Pending clinical improvement Consults called: None Admission status: Inpatient   Estimated body mass index is 28.75 kg/m as calculated from the following:   Height as of this encounter: 6' (1.829 m).   Weight as of this encounter: 96.2 kg.   Subjective: Reports he is feeling better compared to yesterday still dependent on 4 L of oxygen continues to cough reports not sleeping at night requesting something to sleep.  Objective: Vitals:   03/02/19 0227 03/02/19 0245 03/02/19 0512 03/02/19 0913  BP:   (!) 152/78   Pulse:   76   Resp:   (!) 24   Temp:   99.1 F (37.3 C)   TempSrc:   Oral   SpO2: (!) 87% 92% 91% 93%  Weight:      Height:        Intake/Output Summary (Last 24 hours) at 03/02/2019 1155 Last data filed at 03/02/2019 1601 Gross per 24 hour  Intake 3561.03 ml  Output 2475 ml  Net 1086.03 ml   Filed Weights   03/01/19 1443  Weight: 96.2 kg    Examination:  General exam: Appears  calm and comfortable  Respiratory system: biateral rhonchi to auscultation. Respiratory effort normal. Cardiovascular system: S1 & S2 heard, RRR. No JVD, murmurs, rubs, gallops or clicks. No pedal edema. Gastrointestinal system: Abdomen is nondistended, soft and nontender. No organomegaly or masses felt. Normal bowel sounds heard. Central nervous system: Alert and oriented. No focal neurological deficits. Extremities: Symmetric 5 x 5 power. Skin: No rashes, lesions or ulcers Psychiatry: Judgement and insight appear normal. Mood & affect appropriate.     Data Reviewed: I have personally reviewed following labs and imaging studies  CBC: Recent Labs  Lab 03/01/19 1514 03/02/19 0157  WBC 9.2 10.3  NEUTROABS 6.1  --   HGB 11.0* 11.4*  HCT 32.9* 35.1*  MCV 83.5 84.8  PLT 175 093*   Basic Metabolic Panel: Recent Labs  Lab 03/01/19 1514 03/01/19 1615 03/01/19 2015 03/02/19 0157  NA 141  --  142 139  K <2.0*  --  2.3* 2.3*  CL 104  --  105 106  CO2 27  --  28 22  GLUCOSE 159*  --  148* 112*  BUN 12  --  10 10  CREATININE 0.89  --  0.94 0.79  CALCIUM 8.5*  --  8.9 8.1*  MG  --  2.0  --   --   PHOS  --  2.2*  --   --    GFR: Estimated Creatinine Clearance: 122.5 mL/min (by C-G formula based on SCr of 0.79 mg/dL). Liver Function Tests: Recent Labs  Lab 03/01/19 1514 03/02/19 0157  AST 42* 38  ALT 26 25  ALKPHOS 86 81  BILITOT 0.8 0.9  PROT 6.5 6.0*  ALBUMIN 3.2* 2.9*   No results for input(s): LIPASE, AMYLASE in the last 168 hours. No results for input(s): AMMONIA in the last 168 hours. Coagulation Profile: Recent Labs  Lab 03/01/19 1514  INR 1.1   Cardiac Enzymes: No results for input(s): CKTOTAL, CKMB, CKMBINDEX, TROPONINI in the last 168 hours. BNP (last 3 results) No results for input(s): PROBNP in the last 8760 hours. HbA1C: No results for input(s): HGBA1C in the last 72 hours. CBG: Recent Labs  Lab 03/01/19 2229 03/02/19 0732 03/02/19 1126    GLUCAP 158* 148* 154*   Lipid Profile: No results for input(s): CHOL, HDL, LDLCALC, TRIG, CHOLHDL, LDLDIRECT in the last 72 hours. Thyroid Function Tests: No results for input(s): TSH, T4TOTAL, FREET4, T3FREE, THYROIDAB in the last 72 hours. Anemia Panel: No results for input(s): VITAMINB12, FOLATE, FERRITIN, TIBC, IRON, RETICCTPCT in the last 72 hours. Sepsis Labs: Recent Labs  Lab 03/01/19 1514 03/01/19 2015  PROCALCITON  --  <0.10  LATICACIDVEN 1.5 1.1    Recent Results (from the past 240 hour(s))  Respiratory Panel by PCR     Status: None   Collection Time: 03/01/19  3:18 PM  Result Value Ref Range Status   Adenovirus NOT DETECTED NOT DETECTED Final   Coronavirus 229E NOT DETECTED NOT DETECTED Final    Comment: (NOTE) The Coronavirus on the Respiratory Panel, DOES NOT test for the novel  Coronavirus (2019 nCoV)    Coronavirus HKU1 NOT DETECTED NOT DETECTED Final   Coronavirus NL63 NOT DETECTED NOT DETECTED Final   Coronavirus OC43 NOT DETECTED NOT DETECTED Final   Metapneumovirus NOT DETECTED NOT DETECTED Final   Rhinovirus / Enterovirus NOT DETECTED NOT DETECTED Final   Influenza A NOT DETECTED NOT DETECTED Final   Influenza B NOT DETECTED NOT DETECTED Final   Parainfluenza Virus 1 NOT DETECTED NOT DETECTED Final   Parainfluenza Virus 2 NOT DETECTED NOT DETECTED Final   Parainfluenza Virus 3 NOT DETECTED NOT DETECTED Final   Parainfluenza Virus 4 NOT DETECTED NOT DETECTED Final   Respiratory Syncytial Virus NOT DETECTED NOT DETECTED Final   Bordetella pertussis NOT DETECTED NOT DETECTED Final   Chlamydophila pneumoniae NOT DETECTED NOT DETECTED Final   Mycoplasma pneumoniae NOT DETECTED NOT DETECTED Final    Comment: Performed at [facility], [place] [address] [place], [place] [address]  MRSA PCR Screening     Status: None   Collection Time: 03/01/19  8:34 PM  Result Value Ref Range Status   MRSA by PCR NEGATIVE NEGATIVE Final    Comment:        The  GeneXpert MRSA Assay (FDA approved for NASAL specimens only), is one component of a comprehensive MRSA colonization surveillance program. It is not intended to diagnose MRSA infection nor to guide or monitor treatment for MRSA infections. Performed at [facility], [place] [address], [place], [place] [address]          Radiology Studies: Dg Chest 1 View  Result Date: 03/02/2019 CLINICAL DATA:  Cough EXAM: CHEST  1 VIEW COMPARISON:  03/01/2019 FINDINGS: Bilateral lower lobe opacities, right greater than left, new/progressive. No pleural effusion or pneumothorax. The heart is normal in size. IMPRESSION: Progressive bilateral lower lobe opacities, right greater than left, compatible with pneumonia. Electronically Signed   By: Julian Hy M.D.   On: 03/02/2019 04:47   Dg Chest 2 View  Result Date: 03/01/2019 CLINICAL DATA:  Weakness EXAM: CHEST - 2 VIEW COMPARISON:  02/02/2018 FINDINGS: The heart size and mediastinal contours are within normal limits. Both lungs are clear. The visualized skeletal structures are unremarkable. IMPRESSION: No active cardiopulmonary disease. Electronically Signed   By: Ulyses Jarred M.D.   On: 03/01/2019 15:39   Ct Head Wo Contrast  Result Date: 03/01/2019 CLINICAL DATA:  58 year old male with extreme dizziness and weakness for 5 days. Frontal headache. History of prostate cancer. EXAM: CT HEAD WITHOUT CONTRAST TECHNIQUE: Contiguous axial images were obtained from the base of the skull through the vertex without intravenous contrast. COMPARISON:  Brain MRI 12/10/2017. FINDINGS: Brain: Stable cerebral volume since 2018. No midline shift, ventriculomegaly, mass effect, evidence of mass lesion, intracranial hemorrhage or evidence of cortically based acute infarction. Gray-white matter differentiation is within normal limits throughout the brain. Vascular: Mild Calcified atherosclerosis at the skull base. No suspicious intracranial vascular  hyperdensity. Skull: Within normal limits. Sinuses/Orbits: Visualized paranasal sinuses and mastoids are stable and well pneumatized. Small chronic mucous retention cyst in the left maxillary. Tympanic cavities are clear. Other: Postoperative changes to both globes since 2018. Visualized scalp soft tissues are within normal limits. IMPRESSION: Noncontrast CT appearance of the brain appears stable to  the 2018 MRI and within normal limits for age. Negative head CT. Electronically Signed   By: Genevie Ann M.D.   On: 03/01/2019 17:23        Scheduled Meds:  amitriptyline  25 mg Oral QPM   amLODipine  5 mg Oral Daily   carvedilol  6.25 mg Oral BID WC   enoxaparin (LOVENOX) injection  40 mg Subcutaneous Q24H   fluticasone  1 spray Each Nare Daily   insulin aspart  0-5 Units Subcutaneous QHS   insulin aspart  0-9 Units Subcutaneous TID WC   ipratropium-albuterol  3 mL Nebulization BID   levothyroxine  112 mcg Oral Daily   mouth rinse  15 mL Mouth Rinse BID   potassium chloride  40 mEq Oral Once   potassium chloride  40 mEq Oral TID   ticagrelor  90 mg Oral BID   traZODone  50 mg Oral QHS   Continuous Infusions:  sodium chloride 125 mL/hr at 03/02/19 0600   ceFEPime (MAXIPIME) IV 2 g (03/02/19 1139)   potassium chloride 10 mEq (03/02/19 1136)     LOS: 1 day     Georgette Shell, MD [facility]  If 7PM-7AM, please contact night-coverage [email or web address] Password [facility] 03/02/2019, 11:55 AM

## 2019-03-03 LAB — COMPREHENSIVE METABOLIC PANEL
ALT: 23 U/L (ref 0–44)
AST: 34 U/L (ref 15–41)
Albumin: 3 g/dL — ABNORMAL LOW (ref 3.5–5.0)
Alkaline Phosphatase: 88 U/L (ref 38–126)
Anion gap: 17 — ABNORMAL HIGH (ref 5–15)
BUN: 9 mg/dL (ref 6–20)
CO2: 18 mmol/L — ABNORMAL LOW (ref 22–32)
Calcium: 8.6 mg/dL — ABNORMAL LOW (ref 8.9–10.3)
Chloride: 107 mmol/L (ref 98–111)
Creatinine, Ser: 0.85 mg/dL (ref 0.61–1.24)
GFR calc non Af Amer: >60 mL/min (ref >60)
Glucose, Bld: 155 mg/dL — ABNORMAL HIGH (ref 70–99)
Potassium: 2.3 mmol/L — CL (ref 3.5–5.1)
Sodium: 142 mmol/L (ref 135–145)
Total Bilirubin: 1.3 mg/dL — ABNORMAL HIGH (ref 0.3–1.2)
Total Protein: 6.4 g/dL — ABNORMAL LOW (ref 6.5–8.1)

## 2019-03-03 LAB — CBC
HCT: 34.7 % — ABNORMAL LOW (ref 39.0–52.0)
Hemoglobin: 11 g/dL — ABNORMAL LOW (ref 13.0–17.0)
MCH: 27.8 pg (ref 26.0–34.0)
MCHC: 31.7 g/dL (ref 30.0–36.0)
MCV: 87.6 fL (ref 80.0–100.0)
Platelets: 205 10*3/uL (ref 150–400)
RBC: 3.96 MIL/uL — ABNORMAL LOW (ref 4.22–5.81)
RDW: 13.2 % (ref 11.5–15.5)
WBC: 11.4 10*3/uL — ABNORMAL HIGH (ref 4.0–10.5)
nRBC: 0 % (ref 0.0–0.2)

## 2019-03-03 LAB — GLUCOSE, CAPILLARY
Glucose-Capillary: 136 mg/dL — ABNORMAL HIGH (ref 70–99)
Glucose-Capillary: 138 mg/dL — ABNORMAL HIGH (ref 70–99)
Glucose-Capillary: 119 mg/dL — ABNORMAL HIGH (ref 70–99)
Glucose-Capillary: 138 mg/dL — ABNORMAL HIGH (ref 70–99)

## 2019-03-03 LAB — POTASSIUM: Potassium: 3.1 mmol/L — ABNORMAL LOW (ref 3.5–5.1)

## 2019-03-03 MED ORDER — MAGNESIUM SULFATE 2 GM/50ML IV SOLN
2.0000 g | Freq: Once | INTRAVENOUS | Status: AC
  Administered 2019-03-03: 2 g via INTRAVENOUS
  Filled 2019-03-03: qty 50

## 2019-03-03 MED ORDER — ZOLPIDEM TARTRATE 5 MG PO TABS
5.0000 mg | ORAL_TABLET | Freq: Every evening | ORAL | Status: DC | PRN
  Administered 2019-03-03: 5 mg via ORAL

## 2019-03-03 MED ORDER — ZOLPIDEM TARTRATE 5 MG PO TABS
ORAL_TABLET | ORAL | Status: AC
  Filled 2019-03-03: qty 1

## 2019-03-03 MED ORDER — POTASSIUM CHLORIDE 10 MEQ/100ML IV SOLN
10.0000 meq | INTRAVENOUS | Status: AC
  Administered 2019-03-03: 10 meq via INTRAVENOUS
  Filled 2019-03-03: qty 100

## 2019-03-03 MED ORDER — POTASSIUM CHLORIDE CRYS ER 20 MEQ PO TBCR
40.0000 meq | EXTENDED_RELEASE_TABLET | Freq: Three times a day (TID) | ORAL | Status: AC
  Administered 2019-03-03 – 2019-03-04 (×4): 40 meq via ORAL
  Filled 2019-03-03 (×4): qty 2

## 2019-03-03 MED ORDER — POTASSIUM CHLORIDE 10 MEQ/100ML IV SOLN
10.0000 meq | Freq: Once | INTRAVENOUS | Status: AC
  Administered 2019-03-03: 10 meq via INTRAVENOUS
  Filled 2019-03-03: qty 100

## 2019-03-03 MED ORDER — POTASSIUM CHLORIDE CRYS ER 20 MEQ PO TBCR
40.0000 meq | EXTENDED_RELEASE_TABLET | Freq: Three times a day (TID) | ORAL | Status: AC
  Administered 2019-03-03: 40 meq via ORAL
  Filled 2019-03-03: qty 2

## 2019-03-03 MED ORDER — POTASSIUM CHLORIDE CRYS ER 20 MEQ PO TBCR
40.0000 meq | EXTENDED_RELEASE_TABLET | Freq: Once | ORAL | Status: AC
  Administered 2019-03-03: 40 meq via ORAL
  Filled 2019-03-03: qty 2

## 2019-03-03 MED ORDER — POTASSIUM CHLORIDE 10 MEQ/100ML IV SOLN
10.0000 meq | INTRAVENOUS | Status: AC
  Administered 2019-03-03 (×3): 10 meq via INTRAVENOUS
  Filled 2019-03-03 (×3): qty 100

## 2019-03-03 NOTE — Progress Notes (Signed)
Patient able to ambulate 360 feet without oxygen and Oxygen saturation stayed between 92-94%. Did have mild shortness of breath, but otherwise tolerated well. Eulas Post, RN

## 2019-03-03 NOTE — Progress Notes (Signed)
Patient had 2-3 blood clots come from the nares when he blew his nose. PCP on-call was notified.

## 2019-03-03 NOTE — Progress Notes (Signed)
PROGRESS NOTE    Adam Cruz  INO:[id number] DOB: [birth year] DOA: 03/01/2019 PCP: [name], MD  Brief Narrative:58 y.o.malewith medical history significant ofstage IV prostate cancer on Zytiga followed by Dr. [name] with complaints of 1 week history of fever shortness of breath cough. He went to see his primary care physician and did a flu test which was negative. Patient was given prescription for Tamiflu which he took the full 5-day course. He continues to have generalized aches and pains along with fever of 102.1 with persistent cough prior to coming into the ER. He has decreased appetite and has been having less p.o. intake unable to ambulate at home due to generalized weakness he lives at home with his wife. He works from home. He has not traveled outside or internationally. He denies any vomiting occasional nausea reported denies diarrhea abdominal pain. Wife also reported that he is talking confused and out of his head he is seeing things that is not there. ED Course:His K was found to be less than 2  four runs of 10 mg potassium were ordered in the ED. IV fluids were started. CT of the head showed no acute abnormality. Chest x-ray showed no acute findings. Functions normal   Assessment & Plan:   Active Problems:   Hypokalemia   COPD with acute bronchitis (HCC)   Prostate cancer (HCC)   Cough  #1  Acute hypoxic respiratory failure secondary to community-acquired pneumonia- patient admitted with complaints of high-grade fever 102.1 at home along with persistent cough productive shortness of breath and generalized body aches and pains. His flu test was negative at his PCP office. Repeat flu test done here was also negative. He received a course of Tamiflu this week. Respiratory virus panel negative.  MRSA PCR negative.  Vancomycin DC'd.  Continue cefepime.  Patient has  immunocompromise state due to prostate cancer and recent chemotherapy.  Repeat chest x-ray  done today after hydration shows bibasilar infiltrates consistent with pneumonia.  Out of bed ambulate check pulse ox while ambulating  #2 severe hypokalemia potassium 2.3 continue repletion.  Patient has received a total of 280 mEq of potassium p.o. and 10 mEq of potassium IV so far since admission.  We will continue to replace.  #3 stage IV prostate cancer followed by Dr. [name] on [facility].  #4 type 2 diabetes patient takes Amaryl insulin glargine Jardiance at home I will cover him with sliding scale. He also take glipizide at home.  Sugar stable  #5 essential hypertension I will restart Norvasc and Coreg. He is also on lisinopril 40 mg daily at home.  #6 hypothyroidism continue Synthroid.  #7 hyperlipidemia continue Crestor   #8 CAD continue Brilinta. And aspirin  DVT prophylaxis:Lovenox Code Status:Full code Family Communication:Discussed with wife Disposition Plan:Pending clinical improvement Consults called:None Admission status:Inpatient    Estimated body mass index is 28.75 kg/m as calculated from the following:   Height as of this encounter: 6' (1.829 m).   Weight as of this encounter: 96.2 kg.   Subjective: Reports feeling better still dependent on 3 L of oxygen anxious to go home cough better slept better  Objective: Vitals:   03/02/19 2024 03/02/19 2158 03/03/19 0503 03/03/19 0900  BP:  (!) 152/83 (!) 153/79   Pulse:  71 70   Resp:  20 18   Temp:  99.3 F (37.4 C) 98 F (36.7 C)   TempSrc:  Oral Oral   SpO2: 95% 95% 94% 95%  Weight:  Height:        Intake/Output Summary (Last 24 hours) at 03/03/2019 1031 Last data filed at 03/03/2019 0700 Gross per 24 hour  Intake 3506.19 ml  Output 2625 ml  Net 881.19 ml   Filed Weights   03/01/19 1443  Weight: 96.2 kg    Examination:  General exam: Appears calm and comfortable  Respiratory system: Clear to auscultation. Respiratory effort normal. Cardiovascular system: S1 & S2 heard,  RRR. No JVD, murmurs, rubs, gallops or clicks. No pedal edema. Gastrointestinal system: Abdomen is nondistended, soft and nontender. No organomegaly or masses felt. Normal bowel sounds heard. Central nervous system: Alert and oriented. No focal neurological deficits. Extremities: Symmetric 5 x 5 power. Skin: No rashes, lesions or ulcers Psychiatry: Judgement and insight appear normal. Mood & affect appropriate.     Data Reviewed: I have personally reviewed following labs and imaging studies  CBC: Recent Labs  Lab 03/01/19 1514 03/02/19 0157 03/03/19 0409  WBC 9.2 10.3 11.4*  NEUTROABS 6.1  --   --   HGB 11.0* 11.4* 11.0*  HCT 32.9* 35.1* 34.7*  MCV 83.5 84.8 87.6  PLT 175 147* 017   Basic Metabolic Panel: Recent Labs  Lab 03/01/19 1615 03/01/19 2015 03/02/19 0157 03/02/19 1200 03/02/19 1458 03/03/19 0409  NA  --  142 139 139 138 142  K  --  2.3* 2.3* 2.4* 3.2* 2.3*  CL  --  105 106 105 106 107  CO2  --  28 22 22  18* 18*  GLUCOSE  --  148* 112* 110* 120* 155*  BUN  --  10 10 9 8 9   CREATININE  --  0.94 0.79 0.86 0.88 0.85  CALCIUM  --  8.9 8.1* 8.6* 8.3* 8.6*  MG 2.0  --  1.9 1.9  --   --   PHOS 2.2*  --   --   --   --   --    GFR: Estimated Creatinine Clearance: 115.3 mL/min (by C-G formula based on SCr of 0.85 mg/dL). Liver Function Tests: Recent Labs  Lab 03/01/19 1514 03/02/19 0157 03/03/19 0409  AST 42* 38 34  ALT 26 25 23   ALKPHOS 86 81 88  BILITOT 0.8 0.9 1.3*  PROT 6.5 6.0* 6.4*  ALBUMIN 3.2* 2.9* 3.0*   No results for input(s): LIPASE, AMYLASE in the last 168 hours. No results for input(s): AMMONIA in the last 168 hours. Coagulation Profile: Recent Labs  Lab 03/01/19 1514  INR 1.1   Cardiac Enzymes: No results for input(s): CKTOTAL, CKMB, CKMBINDEX, TROPONINI in the last 168 hours. BNP (last 3 results) No results for input(s): PROBNP in the last 8760 hours. HbA1C: No results for input(s): HGBA1C in the last 72 hours. CBG: Recent Labs    Lab 03/02/19 0732 03/02/19 1126 03/02/19 1603 03/02/19 2031 03/03/19 0736  GLUCAP 148* 154* 162* 148* 136*   Lipid Profile: No results for input(s): CHOL, HDL, LDLCALC, TRIG, CHOLHDL, LDLDIRECT in the last 72 hours. Thyroid Function Tests: No results for input(s): TSH, T4TOTAL, FREET4, T3FREE, THYROIDAB in the last 72 hours. Anemia Panel: No results for input(s): VITAMINB12, FOLATE, FERRITIN, TIBC, IRON, RETICCTPCT in the last 72 hours. Sepsis Labs: Recent Labs  Lab 03/01/19 1514 03/01/19 2015  PROCALCITON  --  <0.10  LATICACIDVEN 1.5 1.1    Recent Results (from the past 240 hour(s))  Culture, blood (Routine x 2)     Status: None (Preliminary result)   Collection Time: 03/01/19  3:14 PM  Result Value Ref  Range Status   Specimen Description   Final    BLOOD LEFT WRIST Performed at [facility] [address], [place], [place] [address]    Special Requests   Final    BOTTLES DRAWN AEROBIC AND ANAEROBIC Blood Culture results may not be optimal due to an excessive volume of blood received in culture bottles Performed at [facility] [address] [place], [place] [address]    Culture   Final    NO GROWTH < 24 HOURS Performed at [facility] [address] [place], [place] [address]    Report Status PENDING  Incomplete  Culture, blood (Routine x 2)     Status: None (Preliminary result)   Collection Time: 03/01/19  3:14 PM  Result Value Ref Range Status   Specimen Description   Final    BLOOD LEFT ANTECUBITAL Performed at [facility] [address], [place], [place] [address]    Special Requests   Final    BOTTLES DRAWN AEROBIC AND ANAEROBIC Blood Culture results may not be optimal due to an excessive volume of blood received in culture bottles Performed at [facility] [address] [place], [place] [address]    Culture   Final    NO GROWTH < 24 HOURS Performed at [facility] [address], [place], [place] [address]    Report Status PENDING  Incomplete  Respiratory Panel by PCR     Status: None   Collection Time: 03/01/19  3:18 PM  Result Value Ref Range Status   Adenovirus NOT DETECTED NOT DETECTED Final   Coronavirus 229E NOT DETECTED NOT DETECTED Final    Comment: (NOTE) The Coronavirus on the Respiratory Panel, DOES NOT test for the novel  Coronavirus (2019 nCoV)    Coronavirus HKU1 NOT DETECTED NOT DETECTED Final   Coronavirus NL63 NOT DETECTED NOT DETECTED Final   Coronavirus OC43 NOT DETECTED NOT DETECTED Final   Metapneumovirus NOT DETECTED NOT DETECTED Final   Rhinovirus / Enterovirus NOT DETECTED NOT DETECTED Final   Influenza A NOT DETECTED NOT DETECTED Final   Influenza B NOT DETECTED NOT DETECTED Final   Parainfluenza Virus 1 NOT DETECTED NOT DETECTED Final   Parainfluenza Virus 2 NOT DETECTED NOT DETECTED Final   Parainfluenza Virus 3 NOT DETECTED NOT DETECTED Final   Parainfluenza Virus 4 NOT DETECTED NOT DETECTED Final   Respiratory Syncytial Virus NOT DETECTED NOT DETECTED Final   Bordetella pertussis NOT DETECTED NOT DETECTED Final   Chlamydophila pneumoniae NOT DETECTED NOT DETECTED Final   Mycoplasma pneumoniae NOT DETECTED NOT DETECTED Final    Comment: Performed at [facility], [address], [place], [place] [address]  Expectorated sputum assessment w rflx to resp cult     Status: None   Collection Time: 03/01/19  5:58 PM  Result Value Ref Range Status   Specimen Description SPUTUM  Final   Special Requests Immunocompromised  Final   Sputum evaluation   Final    THIS SPECIMEN IS ACCEPTABLE FOR SPUTUM CULTURE Performed at [facility], [place] [address] [place],  [address]    Report Status 03/02/2019 FINAL  Final  Culture, respiratory     Status: None (Preliminary result)   Collection Time: 03/01/19  5:58 PM  Result Value Ref Range Status   Specimen Description   Final    SPUTUM Performed at  [facility] [address] [place],  [place] [address]    Special Requests   Final    Immunocompromised Reflexed from [id number] Performed at [facility], [place] [address] [place], [place] [address]    Gram Stain   Final    FEW WBC PRESENT, PREDOMINANTLY PMN RARE GRAM POSITIVE COCCI IN CLUSTERS RARE GRAM NEGATIVE RODS Performed at [facility], [place] [address] [place], [place] [address]    Culture PENDING  Incomplete   Report Status PENDING  Incomplete  MRSA PCR Screening     Status: None   Collection Time: 03/01/19  8:34 PM  Result Value Ref Range Status   MRSA by PCR NEGATIVE NEGATIVE Final    Comment:        The GeneXpert MRSA Assay (FDA approved for NASAL specimens only), is one component of a comprehensive MRSA colonization surveillance program. It is not intended to diagnose MRSA infection nor to guide or monitor treatment for MRSA infections. Performed at [facility], [place] [address] [place], [place] [address]          Radiology Studies: Dg Chest 1 View  Result Date: 03/02/2019 CLINICAL DATA:  Cough EXAM: CHEST  1 VIEW COMPARISON:  03/01/2019 FINDINGS: Bilateral lower lobe opacities, right greater than left, new/progressive. No pleural effusion or pneumothorax. The heart is normal in size. IMPRESSION: Progressive bilateral lower lobe opacities, right greater than left, compatible with pneumonia. Electronically Signed   By: Julian Hy M.D.   On: 03/02/2019 04:47   Dg Chest 2 View  Result Date: 03/01/2019 CLINICAL DATA:  Weakness EXAM: CHEST - 2 VIEW COMPARISON:  02/02/2018 FINDINGS: The heart size and mediastinal contours are within normal limits. Both lungs are clear. The visualized skeletal structures are unremarkable. IMPRESSION: No active cardiopulmonary disease. Electronically Signed   By: Ulyses Jarred M.D.   On: 03/01/2019 15:39   Ct Head Wo Contrast  Result Date: 03/01/2019 CLINICAL DATA:   58 year old male with extreme dizziness and weakness for 5 days. Frontal headache. History of prostate cancer. EXAM: CT HEAD WITHOUT CONTRAST TECHNIQUE: Contiguous axial images were obtained from the base of the skull through the vertex without intravenous contrast. COMPARISON:  Brain MRI 12/10/2017. FINDINGS: Brain: Stable cerebral volume since 2018. No midline shift, ventriculomegaly, mass effect, evidence of mass lesion, intracranial hemorrhage or evidence of cortically based acute infarction. Gray-white matter differentiation is within normal limits throughout the brain. Vascular: Mild Calcified atherosclerosis at the skull base. No suspicious intracranial vascular hyperdensity. Skull: Within normal limits. Sinuses/Orbits: Visualized paranasal sinuses and mastoids are stable and well pneumatized. Small chronic mucous retention cyst in the left maxillary. Tympanic cavities are clear. Other: Postoperative changes to both globes since 2018. Visualized scalp soft tissues are within normal limits. IMPRESSION: Noncontrast CT appearance of the brain appears stable to the 2018 MRI and within normal limits for age. Negative head CT. Electronically Signed   By: Genevie Ann M.D.   On: 03/01/2019 17:23        Scheduled Meds:  amitriptyline  25 mg Oral QPM   amLODipine  5 mg Oral Daily   carvedilol  6.25 mg Oral BID WC   enoxaparin (LOVENOX) injection  40 mg Subcutaneous Q24H   fluticasone  1 spray Each Nare Daily   insulin aspart  0-5 Units Subcutaneous QHS   insulin aspart  0-9 Units Subcutaneous TID WC   ipratropium-albuterol  3 mL Nebulization BID   levothyroxine  112 mcg Oral Daily   mouth rinse  15 mL Mouth Rinse  BID   potassium chloride  40 mEq Oral Once   potassium chloride  40 mEq Oral TID   ticagrelor  90 mg Oral BID   traZODone  50 mg Oral QHS   Continuous Infusions:  ceFEPime (MAXIPIME) IV 2 g (03/03/19 0258)   potassium chloride 10 mEq (03/03/19 1024)   potassium chloride        LOS: 2 days     Georgette Shell, MD [facility]  If 7PM-7AM, please contact night-coverage [email or web address] Password [facility] 03/03/2019, 10:31 AM

## 2019-03-03 NOTE — Progress Notes (Signed)
CRITICAL VALUE ALERT  Critical Value:  Potassium 2.3  Date & Time Notied:  03/03/19;0644  Provider Notified: Yes, notified  Orders Received/Actions taken: Awaiting any new orders

## 2019-03-04 ENCOUNTER — Telehealth: Payer: Self-pay | Admitting: Oncology

## 2019-03-04 ENCOUNTER — Other Ambulatory Visit: Payer: Self-pay | Admitting: Oncology

## 2019-03-04 DIAGNOSIS — C61 Malignant neoplasm of prostate: Secondary | ICD-10-CM

## 2019-03-04 LAB — BASIC METABOLIC PANEL
ANION GAP: 14 (ref 5–15)
BUN: 11 mg/dL (ref 6–20)
CO2: 19 mmol/L — ABNORMAL LOW (ref 22–32)
Calcium: 8.6 mg/dL — ABNORMAL LOW (ref 8.9–10.3)
Chloride: 106 mmol/L (ref 98–111)
Creatinine, Ser: 0.76 mg/dL (ref 0.61–1.24)
GFR calc non Af Amer: >60 mL/min (ref >60)
Glucose, Bld: 160 mg/dL — ABNORMAL HIGH (ref 70–99)
Potassium: 2.8 mmol/L — ABNORMAL LOW (ref 3.5–5.1)
Sodium: 139 mmol/L (ref 135–145)

## 2019-03-04 LAB — MAGNESIUM: Magnesium: 2.1 mg/dL (ref 1.7–2.4)

## 2019-03-04 LAB — GLUCOSE, CAPILLARY: Glucose-Capillary: 173 mg/dL — ABNORMAL HIGH (ref 70–99)

## 2019-03-04 MED ORDER — AMOXICILLIN-POT CLAVULANATE 875-125 MG PO TABS: 1.0000 | ORAL_TABLET | Freq: Two times a day (BID) | ORAL | 0 refills | Status: AC

## 2019-03-04 MED ORDER — POTASSIUM CHLORIDE ER 10 MEQ PO TBCR: 80.0000 meq | EXTENDED_RELEASE_TABLET | Freq: Three times a day (TID) | ORAL | 1 refills | Status: DC

## 2019-03-04 NOTE — Discharge Summary (Signed)
Physician Discharge Summary  Adam Cruz GYI:[id number] DOB: Aug 05, 1961 DOA: 03/01/2019  PCP: [name], MD  Admit date: 03/01/2019 Discharge date: 03/04/2019  Admitted From: Home Disposition: Home  Recommendations for Outpatient Follow-up:  1. Follow up with PCP in 1-2 weeks 2. Please obtain BMP/CBC 03/06/2019 at Dr. Hazeline Junker office  3. follow-up with Dr. [name]  Home Health: None Equipment/Devices none Discharge Condition stable and improved CODE STATUS: Full code Diet recommendation cardiac  Brief/Interim Summary:58 y.o.malewith medical history significant ofstage IV prostate cancer on Zytiga followed by Dr. [name] with complaints of 1 week history of fever shortness of breath cough. He went to see his primary care physician and did a flu test which was negative. Patient was given prescription for Tamiflu which he took the full 5-day course. He continues to have generalized aches and pains along with fever of 102.1 with persistent cough prior to coming into the ER. He has decreased appetite and has been having less p.o. intake unable to ambulate at home due to generalized weakness he lives at home with his wife. He works from home. He has not traveled outside or internationally. He denies any vomiting occasional nausea reported denies diarrhea abdominal pain. Wife also reported that he is talking confused and out of his head he is seeing things that is not there. ED Course:His K was found to be less than 89fourruns of 10 mg potassium were ordered in the ED. IV fluids were started. CT of the head showed no acute abnormality. Chest x-ray showed no acute findings. Functions normal    Discharge Diagnoses:  Active Problems:   Hypokalemia   COPD with acute bronchitis (HCC)   Prostate cancer (HCC)   Cough  #1Acute hypoxic respiratory failure secondary to community-acquired pneumonia-patient admitted with complaints of high-grade fever 102.1 at home along with  persistent cough productive shortness of breath and generalized body aches and pains. His flu test was negative at his PCP office. Repeat flu test done here was also negative. He received a course of Tamiflu this week. Respiratory virus panelnegative. MRSA PCR negative. Vancomycin DC'd.  Treated with IV cefepime till the day of discharge.  Will be discharged on Augmentin twice a day for 4 more days.   #2 severe hypokalemia contrary to Zytigapotassium was less than 2 at the time of admission.  He received over 500 mEq of potassium during this hospital stay.  Potassium on discharge is 2.8.  He will be discharged on 80 mEq of potassium 3 times a day.  He will check his CBC BMP 03/06/2019 at Dr. Hazeline Junker office.   #3 stage IV prostate cancer followed by Dr. [name] on [place].  Zytiga was on hold during the hospital stay.  I have advised the patient to hold off on taking Zytiga until seen by Dr. [name].  Discussed with Dr. [name].  #4 type 2 diabetes patient takes Amaryl insulin glargine Jardiance at home continue.  Continue glipizide too.  #5 essential hypertension continue Norvasc and Coreg. He is also on lisinopril 40 mg daily at home.  #6 hypothyroidism continue Synthroid.  #7 hyperlipidemia continue Crestor   Estimated body mass index is 28.75 kg/m as calculated from the following:   Height as of this encounter: 6' (1.829 m).   Weight as of this encounter: 96.2 kg.  Discharge Instructions  Discharge Instructions    Call MD for:  difficulty breathing, headache or visual disturbances   Complete by:  As directed    Call MD for:  persistant dizziness  or light-headedness   Complete by:  As directed    Call MD for:  persistant nausea and vomiting   Complete by:  As directed    Call MD for:  severe uncontrolled pain   Complete by:  As directed    Call MD for:  temperature >100.4   Complete by:  As directed    Diet - low sodium heart healthy   Complete by:  As directed     Increase activity slowly   Complete by:  As directed      Allergies as of 03/04/2019   No Known Allergies     Medication List    STOP taking these medications   abiraterone acetate 250 MG tablet Commonly known as:  ZYTIGA   oseltamivir 75 MG capsule Commonly known as:  TAMIFLU   potassium chloride SA 20 MEQ tablet Commonly known as:  K-DUR,KLOR-CON   Zytiga 250 MG tablet Generic drug:  abiraterone acetate     TAKE these medications   amitriptyline 25 MG tablet Commonly known as:  ELAVIL Take 25 mg by mouth every evening.   amLODipine 5 MG tablet Commonly known as:  NORVASC Take 5 mg by mouth daily.   amoxicillin-clavulanate 875-125 MG tablet Commonly known as:  Augmentin Take 1 tablet by mouth 2 (two) times daily for 4 days.   ASPIRIN 81 PO Take 81 mg by mouth at bedtime.   carvedilol 25 MG tablet Commonly known as:  COREG Take 25 mg by mouth 2 (two) times daily.   fluticasone 50 MCG/ACT nasal spray Commonly known as:  FLONASE Place 1 spray into both nostrils daily.   Horizant 600 MG Tbcr Generic drug:  Gabapentin Enacarbil Take 600 mg by mouth 2 (two) times daily.   Insulin Glargine-Lixisenatide 100-33 UNT-MCG/ML Sopn Inject 65 Units into the skin daily.   Jardiance 25 MG Tabs tablet Generic drug:  empagliflozin Take 25 mg by mouth daily.   levothyroxine 112 MCG tablet Commonly known as:  SYNTHROID, LEVOTHROID Take 112 mcg by mouth daily.   lisinopril 40 MG tablet Commonly known as:  PRINIVIL,ZESTRIL Take 40 mg by mouth daily.   NovoLOG FlexPen 100 UNIT/ML FlexPen Generic drug:  insulin aspart Inject 12 Units into the skin daily as needed.   omeprazole 40 MG capsule Commonly known as:  PRILOSEC Take 40 mg by mouth daily.   potassium chloride 10 MEQ tablet Commonly known as:  K-DUR Take 8 tablets (80 mEq total) by mouth 3 (three) times daily.   rosuvastatin 40 MG tablet Commonly known as:  CRESTOR   ticagrelor 90 MG Tabs  tablet Commonly known as:  BRILINTA Take 90 mg by mouth 2 (two) times daily.   Vitamin D3 125 MCG (5000 UT) Caps Take 1 capsule by mouth daily.       No Known Allergies  Consultations:  Phone consult with Dr. [name]   Procedures/Studies: Dg Chest 1 View  Result Date: 03/02/2019 CLINICAL DATA:  Cough EXAM: CHEST  1 VIEW COMPARISON:  03/01/2019 FINDINGS: Bilateral lower lobe opacities, right greater than left, new/progressive. No pleural effusion or pneumothorax. The heart is normal in size. IMPRESSION: Progressive bilateral lower lobe opacities, right greater than left, compatible with pneumonia. Electronically Signed   By: Julian Hy M.D.   On: 03/02/2019 04:47   Dg Chest 2 View  Result Date: 03/01/2019 CLINICAL DATA:  Weakness EXAM: CHEST - 2 VIEW COMPARISON:  02/02/2018 FINDINGS: The heart size and mediastinal contours are within normal limits. Both lungs are  clear. The visualized skeletal structures are unremarkable. IMPRESSION: No active cardiopulmonary disease. Electronically Signed   By: Ulyses Jarred M.D.   On: 03/01/2019 15:39   Ct Head Wo Contrast  Result Date: 03/01/2019 CLINICAL DATA:  59 year old male with extreme dizziness and weakness for 5 days. Frontal headache. History of prostate cancer. EXAM: CT HEAD WITHOUT CONTRAST TECHNIQUE: Contiguous axial images were obtained from the base of the skull through the vertex without intravenous contrast. COMPARISON:  Brain MRI 12/10/2017. FINDINGS: Brain: Stable cerebral volume since 2018. No midline shift, ventriculomegaly, mass effect, evidence of mass lesion, intracranial hemorrhage or evidence of cortically based acute infarction. Gray-white matter differentiation is within normal limits throughout the brain. Vascular: Mild Calcified atherosclerosis at the skull base. No suspicious intracranial vascular hyperdensity. Skull: Within normal limits. Sinuses/Orbits: Visualized paranasal sinuses and mastoids are stable and well  pneumatized. Small chronic mucous retention cyst in the left maxillary. Tympanic cavities are clear. Other: Postoperative changes to both globes since 2018. Visualized scalp soft tissues are within normal limits. IMPRESSION: Noncontrast CT appearance of the brain appears stable to the 2018 MRI and within normal limits for age. Negative head CT. Electronically Signed   By: Genevie Ann M.D.   On: 03/01/2019 17:23    (Echo, Carotid, EGD, Colonoscopy, ERCP)    Subjective: Patient anxious to go home patient is on room air ambulated with good oxygen saturation about 94% cough is better denies any nausea vomiting diarrhea  Discharge Exam: Vitals:   03/04/19 0819 03/04/19 0822  BP:    Pulse:    Resp:    Temp:    SpO2: 92% 92%   Vitals:   03/03/19 2109 03/04/19 0628 03/04/19 0819 03/04/19 0822  BP: (!) 159/84 (!) 145/79    Pulse: 60 69    Resp: 18 17    Temp: 97.9 F (36.6 C) 98 F (36.7 C)    TempSrc: Oral Oral    SpO2: 96% 95% 92% 92%  Weight:      Height:        General: Pt is alert, awake, not in acute distress Cardiovascular: RRR, S1/S2 +, no rubs, no gallops Respiratory: Few scattered rhonchi bilaterally, no wheezing, no rhonchi Abdominal: Soft, NT, ND, bowel sounds + Extremities: no edema, no cyanosis    The results of significant diagnostics from this hospitalization (including imaging, microbiology, ancillary and laboratory) are listed below for reference.     Microbiology: Recent Results (from the past 240 hour(s))  Culture, blood (Routine x 2)     Status: None (Preliminary result)   Collection Time: 03/01/19  3:14 PM  Result Value Ref Range Status   Specimen Description   Final    BLOOD LEFT WRIST Performed at [facility], [address] [place], [place] [address]    Special Requests   Final    BOTTLES DRAWN AEROBIC AND ANAEROBIC Blood Culture results may not be optimal due to an excessive volume of blood received in culture bottles Performed at [facility] [address], [place], [place] [address]    Culture   Final    NO GROWTH 3 DAYS Performed at [facility], [place] [address], [place], [place] [address]    Report Status PENDING  Incomplete  Culture, blood (Routine x 2)     Status: None (Preliminary result)   Collection Time: 03/01/19  3:14 PM  Result Value Ref Range Status   Specimen Description   Final    BLOOD  LEFT ANTECUBITAL Performed at [facility] [address], [place], [place] [address]    Special Requests   Final    BOTTLES DRAWN AEROBIC AND ANAEROBIC Blood Culture results may not be optimal due to an excessive volume of blood received in culture bottles Performed at [facility] [address], [place], [place] [address]    Culture   Final    NO GROWTH 3 DAYS Performed at [facility], [place] [address], [place], [place] [address]    Report Status PENDING  Incomplete  Respiratory Panel by PCR     Status: None   Collection Time: 03/01/19  3:18 PM  Result Value Ref Range Status   Adenovirus NOT DETECTED NOT DETECTED Final   Coronavirus 229E NOT DETECTED NOT DETECTED Final    Comment: (NOTE) The Coronavirus on the Respiratory Panel, DOES NOT test for the novel  Coronavirus (2019 nCoV)    Coronavirus HKU1 NOT DETECTED NOT DETECTED Final   Coronavirus NL63 NOT DETECTED NOT DETECTED Final   Coronavirus OC43 NOT DETECTED NOT DETECTED Final   Metapneumovirus NOT DETECTED NOT DETECTED Final   Rhinovirus / Enterovirus NOT DETECTED NOT DETECTED Final   Influenza A NOT DETECTED NOT DETECTED Final   Influenza B NOT DETECTED NOT DETECTED Final   Parainfluenza Virus 1 NOT DETECTED NOT DETECTED Final   Parainfluenza Virus 2 NOT DETECTED NOT DETECTED Final   Parainfluenza Virus 3 NOT DETECTED NOT DETECTED Final   Parainfluenza Virus 4 NOT DETECTED NOT DETECTED Final   Respiratory Syncytial Virus NOT DETECTED NOT DETECTED Final   Bordetella pertussis NOT  DETECTED NOT DETECTED Final   Chlamydophila pneumoniae NOT DETECTED NOT DETECTED Final   Mycoplasma pneumoniae NOT DETECTED NOT DETECTED Final    Comment: Performed at [facility], [address], [place], [place] [address]  Expectorated sputum assessment w rflx to resp cult     Status: None   Collection Time: 03/01/19  5:58 PM  Result Value Ref Range Status   Specimen Description SPUTUM  Final   Special Requests Immunocompromised  Final   Sputum evaluation   Final    THIS SPECIMEN IS ACCEPTABLE FOR SPUTUM CULTURE Performed at [facility], [place] [address] [place], [place] [address]    Report Status 03/02/2019 FINAL  Final  Culture, respiratory     Status: None (Preliminary result)   Collection Time: 03/01/19  5:58 PM  Result Value Ref Range Status   Specimen Description   Final    SPUTUM Performed at [facility] [address], [place], [place] [address]    Special Requests   Final    Immunocompromised Reflexed from [phone] Performed at [facility], [place] [address] [place], [place] [address]    Gram Stain   Final    FEW WBC PRESENT, PREDOMINANTLY PMN RARE GRAM POSITIVE COCCI IN CLUSTERS RARE GRAM NEGATIVE RODS Performed at [facility], [place] [address], [place], [place] [address]    Culture PENDING  Incomplete   Report Status PENDING  Incomplete  MRSA PCR Screening     Status: None   Collection Time: 03/01/19  8:34 PM  Result Value Ref Range Status   MRSA by PCR NEGATIVE NEGATIVE Final    Comment:        The GeneXpert MRSA Assay (FDA approved for NASAL specimens only), is one component of a comprehensive MRSA colonization surveillance program. It is not intended to diagnose  MRSA infection nor to guide or monitor treatment for MRSA infections. Performed at [facility], [place] [address] [place], [place] [address]      Labs: BNP (last 3 results) No results for input(s): BNP in  the last 8760 hours. Basic Metabolic Panel: Recent Labs  Lab 03/01/19 1615  03/02/19 0157 03/02/19 1200 03/02/19 1458 03/03/19 0409 03/03/19 2305 03/04/19 0417  NA  --    < > 139 139 138 142  --  139  K  --    < > 2.3* 2.4* 3.2* 2.3* 3.1* 2.8*  CL  --    < > 106 105 106 107  --  106  CO2  --    < > 22 22 18* 18*  --  19*  GLUCOSE  --    < > 112* 110* 120* 155*  --  160*  BUN  --    < > 10 9 8 9   --  11  CREATININE  --    < > 0.79 0.86 0.88 0.85  --  0.76  CALCIUM  --    < > 8.1* 8.6* 8.3* 8.6*  --  8.6*  MG 2.0  --  1.9 1.9  --   --   --  2.1  PHOS 2.2*  --   --   --   --   --   --   --    < > = values in this interval not displayed.   Liver Function Tests: Recent Labs  Lab 03/01/19 1514 03/02/19 0157 03/03/19 0409  AST 42* 38 34  ALT 26 25 23   ALKPHOS 86 81 88  BILITOT 0.8 0.9 1.3*  PROT 6.5 6.0* 6.4*  ALBUMIN 3.2* 2.9* 3.0*   No results for input(s): LIPASE, AMYLASE in the last 168 hours. No results for input(s): AMMONIA in the last 168 hours. CBC: Recent Labs  Lab 03/01/19 1514 03/02/19 0157 03/03/19 0409  WBC 9.2 10.3 11.4*  NEUTROABS 6.1  --   --   HGB 11.0* 11.4* 11.0*  HCT 32.9* 35.1* 34.7*  MCV 83.5 84.8 87.6  PLT 175 147* 205   Cardiac Enzymes: No results for input(s): CKTOTAL, CKMB, CKMBINDEX, TROPONINI in the last 168 hours. BNP: Invalid input(s): POCBNP CBG: Recent Labs  Lab 03/03/19 0736 03/03/19 1153 03/03/19 1642 03/03/19 2111 03/04/19 0833  GLUCAP 136* 138* 119* 138* 173*   D-Dimer No results for input(s): DDIMER in the last 72 hours. Hgb A1c No results for input(s): HGBA1C in the last 72 hours. Lipid Profile No results for input(s): CHOL, HDL, LDLCALC, TRIG, CHOLHDL, LDLDIRECT in the last 72 hours. Thyroid function studies No results for input(s): TSH, T4TOTAL, T3FREE, THYROIDAB in the last 72 hours.  Invalid input(s): FREET3 Anemia work up No results for input(s): VITAMINB12, FOLATE, FERRITIN, TIBC, IRON, RETICCTPCT in the  last 72 hours. Urinalysis    Component Value Date/Time   COLORURINE YELLOW 03/01/2019 1451   APPEARANCEUR CLEAR 03/01/2019 1451   LABSPEC 1.032 (H) 03/01/2019 1451   PHURINE 6.0 03/01/2019 1451   GLUCOSEU >=500 (A) 03/01/2019 1451   HGBUR MODERATE (A) 03/01/2019 1451   BILIRUBINUR NEGATIVE 03/01/2019 1451   KETONESUR NEGATIVE 03/01/2019 1451   PROTEINUR 100 (A) 03/01/2019 1451   NITRITE NEGATIVE 03/01/2019 1451   LEUKOCYTESUR NEGATIVE 03/01/2019 1451   Sepsis Labs Invalid input(s): PROCALCITONIN,  WBC,  LACTICIDVEN Microbiology Recent Results (from the past 240 hour(s))  Culture, blood (Routine x 2)     Status: None (Preliminary  result)   Collection Time: 03/01/19  3:14 PM  Result Value Ref Range Status   Specimen Description   Final    BLOOD LEFT WRIST Performed at [facility], [address] [place], [place] [address]    Special Requests   Final    BOTTLES DRAWN AEROBIC AND ANAEROBIC Blood Culture results may not be optimal due to an excessive volume of blood received in culture bottles Performed at [facility] [address] [place], [place] [address]    Culture   Final    NO GROWTH 3 DAYS Performed at [facility], [place] [address], [place], [place] [address]    Report Status PENDING  Incomplete  Culture, blood (Routine x 2)     Status: None (Preliminary result)   Collection Time: 03/01/19  3:14 PM  Result Value Ref Range Status   Specimen Description   Final    BLOOD LEFT ANTECUBITAL Performed at [facility] [address] [place], [place] [address]    Special Requests   Final    BOTTLES DRAWN AEROBIC AND ANAEROBIC Blood Culture results may not be optimal due to an excessive volume of blood received in culture bottles Performed at [facility] [address] [place], [place] [address]    Culture   Final    NO GROWTH 3 DAYS Performed at [facility], [place] [address]  [place], [place] [address]    Report Status PENDING  Incomplete  Respiratory Panel by PCR     Status: None   Collection Time: 03/01/19  3:18 PM  Result Value Ref Range Status   Adenovirus NOT DETECTED NOT DETECTED Final   Coronavirus 229E NOT DETECTED NOT DETECTED Final    Comment: (NOTE) The Coronavirus on the Respiratory Panel, DOES NOT test for the novel  Coronavirus (2019 nCoV)    Coronavirus HKU1 NOT DETECTED NOT DETECTED Final   Coronavirus NL63 NOT DETECTED NOT DETECTED Final   Coronavirus OC43 NOT DETECTED NOT DETECTED Final   Metapneumovirus NOT DETECTED NOT DETECTED Final   Rhinovirus / Enterovirus NOT DETECTED NOT DETECTED Final   Influenza A NOT DETECTED NOT DETECTED Final   Influenza B NOT DETECTED NOT DETECTED Final   Parainfluenza Virus 1 NOT DETECTED NOT DETECTED Final   Parainfluenza Virus 2 NOT DETECTED NOT DETECTED Final   Parainfluenza Virus 3 NOT DETECTED NOT DETECTED Final   Parainfluenza Virus 4 NOT DETECTED NOT DETECTED Final   Respiratory Syncytial Virus NOT DETECTED NOT DETECTED Final   Bordetella pertussis NOT DETECTED NOT DETECTED Final   Chlamydophila pneumoniae NOT DETECTED NOT DETECTED Final   Mycoplasma pneumoniae NOT DETECTED NOT DETECTED Final    Comment: Performed at [facility], [address] [place], [place] [address]  Expectorated sputum assessment w rflx to resp cult     Status: None   Collection Time: 03/01/19  5:58 PM  Result Value Ref Range Status   Specimen Description SPUTUM  Final   Special Requests Immunocompromised  Final   Sputum evaluation   Final    THIS SPECIMEN IS ACCEPTABLE FOR SPUTUM CULTURE Performed at [facility], [place] [address], [place], [place] [address]    Report Status 03/02/2019 FINAL  Final  Culture, respiratory     Status: None (Preliminary result)   Collection Time: 03/01/19  5:58 PM  Result Value Ref Range Status   Specimen Description   Final    SPUTUM Performed  at [facility], [place] [address] [place], [place] [address]    Special Requests   Final    Immunocompromised Reflexed from [id number] Performed at [facility], [place] [address], [place], [place] [address]    Gram Stain   Final    FEW WBC PRESENT, PREDOMINANTLY PMN RARE GRAM POSITIVE COCCI IN CLUSTERS RARE GRAM NEGATIVE RODS Performed at [facility], [place] [address] [place], [place] [address]    Culture PENDING  Incomplete   Report Status PENDING  Incomplete  MRSA PCR Screening     Status: None   Collection Time: 03/01/19  8:34 PM  Result Value Ref Range Status   MRSA by PCR NEGATIVE NEGATIVE Final    Comment:        The GeneXpert MRSA Assay (FDA approved for NASAL specimens only), is one component of a comprehensive MRSA colonization surveillance program. It is not intended to diagnose MRSA infection nor to guide or monitor treatment for MRSA infections. Performed at [facility], [place] [address] [place], [place] [address]      Time coordinating discharge: 33 minutes  SIGNED:   Georgette Shell, MD  [facility] 03/04/2019, 9:50 AM Pager   If 7PM-7AM, please contact night-coverage [email or web address] Password [email or web address]

## 2019-03-04 NOTE — Progress Notes (Signed)
Pharmacy Antibiotic Note  Adam Cruz is a 58 y.o. male with a h/o stage iv prostate canceradmitted on 03/01/2019 with one week h/o fever, sob, and cough.  Pharmacy has been consulted for vancomycin and cefepime dosing and vanc had since been d/c'd and cefepime continues. Received a course of Tamiflu although flu tests were negative.   Today, 03/04/19  Day 3 Abxs  Cultures negative to date  Afebrile  SCr stable  WBC 11.4 on 3/14 elevated slightly from 10.3 the previous day  Plan: Continue Cefepime 2g IV q8 for now, BUT given negative cultures to date, would recommend to discontinue soon if continue to be negative   Height: 6' (182.9 cm) Weight: 212 lb (96.2 kg) IBW/kg (Calculated) : 77.6  Temp (24hrs), Avg:98.1 F (36.7 C), Min:97.9 F (36.6 C), Max:98.5 F (36.9 C)  Recent Labs  Lab 03/01/19 1514 03/01/19 2015 03/02/19 0157 03/02/19 1200 03/02/19 1458 03/03/19 0409 03/04/19 0417  WBC 9.2  --  10.3  --   --  11.4*  --   CREATININE 0.89 0.94 0.79 0.86 0.88 0.85 0.76  LATICACIDVEN 1.5 1.1  --   --   --   --   --     Estimated Creatinine Clearance: 122.5 mL/min (by C-G formula based on SCr of 0.76 mg/dL).    No Known Allergies  Antimicrobials this admission: 3/13 cefepime >>  3/13 vancomycin >>   Dose adjustments this admission:  Microbiology results: 3/13 BCx: sent 3/13 Sputum: sent  3/13 MRSA PCR:  Thank you for allowing pharmacy to be a part of this patient's care.  Kara Mead 03/04/2019 9:09 AM

## 2019-03-04 NOTE — Progress Notes (Signed)
Pt discharged to home , instructions reviewed with patient. Acknowledged understanding. SRP, RN

## 2019-03-04 NOTE — Telephone Encounter (Signed)
Scheduled appt per 3/16 sch message - unable to reach pt . Left message with appt date and time   

## 2019-03-05 ENCOUNTER — Other Ambulatory Visit: Payer: Self-pay | Admitting: Oncology

## 2019-03-05 LAB — CULTURE, RESPIRATORY W GRAM STAIN

## 2019-03-05 LAB — CULTURE, RESPIRATORY: CULTURE: NORMAL

## 2019-03-05 NOTE — Progress Notes (Signed)
Patient's case and condition was discussed over the phone today.  He was recently hospitalized at [facility] for community-acquired pneumonia.  He was treated with intravenous antibiotics with improvement in his symptoms.  He has developed a hypokalemia related due to [facility] which I asked him to hold for the time being.  He will return tomorrow for repeat laboratory testing including electrolytes and potassium and will give him follow-up instructions regarding potassium supplements moving forward.  His MD follow-up will stay as scheduled in April for the time being.

## 2019-03-06 ENCOUNTER — Inpatient Hospital Stay: Payer: BLUE CROSS/BLUE SHIELD | Attending: Radiation Oncology

## 2019-03-06 ENCOUNTER — Other Ambulatory Visit: Payer: Self-pay | Admitting: *Deleted

## 2019-03-06 ENCOUNTER — Other Ambulatory Visit: Payer: Self-pay

## 2019-03-06 ENCOUNTER — Inpatient Hospital Stay: Payer: BLUE CROSS/BLUE SHIELD | Admitting: Oncology

## 2019-03-06 DIAGNOSIS — C61 Malignant neoplasm of prostate: Secondary | ICD-10-CM | POA: Insufficient documentation

## 2019-03-06 LAB — CMP (CANCER CENTER ONLY)
ALT: 27 U/L (ref 0–44)
AST: 36 U/L (ref 15–41)
Albumin: 3.1 g/dL — ABNORMAL LOW (ref 3.5–5.0)
Alkaline Phosphatase: 88 U/L (ref 38–126)
Anion gap: 13 (ref 5–15)
BUN: 8 mg/dL (ref 6–20)
CHLORIDE: 102 mmol/L (ref 98–111)
CO2: 27 mmol/L (ref 22–32)
Calcium: 9.1 mg/dL (ref 8.9–10.3)
Creatinine: 0.9 mg/dL (ref 0.61–1.24)
GFR, Est AFR Am: >60 mL/min (ref >60)
GFR, Estimated: >60 mL/min (ref >60)
Glucose, Bld: 203 mg/dL — ABNORMAL HIGH (ref 70–99)
Potassium: 2.8 mmol/L — CL (ref 3.5–5.1)
SODIUM: 142 mmol/L (ref 135–145)
Total Bilirubin: 0.6 mg/dL (ref 0.3–1.2)
Total Protein: 7.1 g/dL (ref 6.5–8.1)

## 2019-03-06 LAB — CBC WITH DIFFERENTIAL (CANCER CENTER ONLY)
Abs Immature Granulocytes: 0.1 10*3/uL — ABNORMAL HIGH (ref 0.00–0.07)
Basophils Absolute: 0.1 10*3/uL (ref 0.0–0.1)
Basophils Relative: 1 %
EOS ABS: 0.5 10*3/uL (ref 0.0–0.5)
Eosinophils Relative: 5 %
HCT: 35.1 % — ABNORMAL LOW (ref 39.0–52.0)
Hemoglobin: 11.8 g/dL — ABNORMAL LOW (ref 13.0–17.0)
Immature Granulocytes: 1 %
LYMPHS ABS: 2.8 10*3/uL (ref 0.7–4.0)
Lymphocytes Relative: 30 %
MCH: 28.2 pg (ref 26.0–34.0)
MCHC: 33.6 g/dL (ref 30.0–36.0)
MCV: 84 fL (ref 80.0–100.0)
Monocytes Absolute: 0.8 10*3/uL (ref 0.1–1.0)
Monocytes Relative: 8 %
Neutro Abs: 5.2 10*3/uL (ref 1.7–7.7)
Neutrophils Relative %: 55 %
Platelet Count: 237 10*3/uL (ref 150–400)
RBC: 4.18 MIL/uL — ABNORMAL LOW (ref 4.22–5.81)
RDW: 13.3 % (ref 11.5–15.5)
WBC Count: 9.5 10*3/uL (ref 4.0–10.5)
nRBC: 0 % (ref 0.0–0.2)

## 2019-03-06 LAB — CULTURE, BLOOD (ROUTINE X 2)
CULTURE: NO GROWTH
Culture: NO GROWTH

## 2019-03-06 NOTE — Progress Notes (Signed)
Critical Potassium lab reviewed with Dr. [name].  Results same as 2 days ago.  Per MD medication related and patient is aware that he should continue to hold his Zytiga.  Continue current potassium dose.  Recheck BMP in one week.    Left message for patient to call back to advise.  Pending returned call.

## 2019-03-07 ENCOUNTER — Telehealth: Payer: Self-pay

## 2019-03-07 ENCOUNTER — Other Ambulatory Visit: Payer: Self-pay | Admitting: Oncology

## 2019-03-07 LAB — PROSTATE-SPECIFIC AG, SERUM (LABCORP): Prostate Specific Ag, Serum: <0.1 ng/mL (ref 0.0–4.0)

## 2019-03-07 NOTE — Telephone Encounter (Signed)
Contacted patient and made aware of PSA results. Provided next appointment dates and times. Also confirmed that patient is taking KCL TID.

## 2019-03-11 ENCOUNTER — Inpatient Hospital Stay: Payer: BLUE CROSS/BLUE SHIELD

## 2019-03-11 ENCOUNTER — Other Ambulatory Visit: Payer: Self-pay

## 2019-03-11 ENCOUNTER — Telehealth: Payer: Self-pay

## 2019-03-11 DIAGNOSIS — C61 Malignant neoplasm of prostate: Secondary | ICD-10-CM | POA: Diagnosis not present

## 2019-03-11 LAB — CMP (CANCER CENTER ONLY)
ALT: 48 U/L — ABNORMAL HIGH (ref 0–44)
AST: 62 U/L — ABNORMAL HIGH (ref 15–41)
Albumin: 3.3 g/dL — ABNORMAL LOW (ref 3.5–5.0)
Alkaline Phosphatase: 82 U/L (ref 38–126)
Anion gap: 11 (ref 5–15)
BUN: 12 mg/dL (ref 6–20)
CO2: 24 mmol/L (ref 22–32)
Calcium: 9 mg/dL (ref 8.9–10.3)
Chloride: 106 mmol/L (ref 98–111)
Creatinine: 0.86 mg/dL (ref 0.61–1.24)
GFR, Est AFR Am: >60 mL/min (ref >60)
GFR, Estimated: >60 mL/min (ref >60)
Glucose, Bld: 244 mg/dL — ABNORMAL HIGH (ref 70–99)
POTASSIUM: 3.9 mmol/L (ref 3.5–5.1)
SODIUM: 141 mmol/L (ref 135–145)
Total Bilirubin: 0.4 mg/dL (ref 0.3–1.2)
Total Protein: 7.2 g/dL (ref 6.5–8.1)

## 2019-03-11 NOTE — Telephone Encounter (Signed)
Contacted patient and made aware of lab results and to continue taking potassium supplement daily and that he should be hearing from a scheduler with a follow up lab appointment for next week. Also verbalized to remain off the Zytiga until his follow up appointment with Dr. [name] on 4/15. Patient verbalized understanding, repeated instructions back, and had no other questions or concerns.

## 2019-03-11 NOTE — Telephone Encounter (Signed)
-----   Message from Wyatt Portela, MD sent at 03/11/2019  8:36 AM EDT ----- Please let him know his K is back to normal. He needs to: Continue to hold Zytiga till I see him in April.  Continue K replacement at 40 meq only daily.  BMET next week.

## 2019-03-18 ENCOUNTER — Other Ambulatory Visit: Payer: Self-pay

## 2019-03-18 ENCOUNTER — Telehealth: Payer: Self-pay | Admitting: *Deleted

## 2019-03-18 ENCOUNTER — Inpatient Hospital Stay: Payer: BLUE CROSS/BLUE SHIELD

## 2019-03-18 DIAGNOSIS — C61 Malignant neoplasm of prostate: Secondary | ICD-10-CM

## 2019-03-18 LAB — BASIC METABOLIC PANEL - CANCER CENTER ONLY
Anion gap: 11 (ref 5–15)
BUN: 20 mg/dL (ref 6–20)
CHLORIDE: 106 mmol/L (ref 98–111)
CO2: 21 mmol/L — ABNORMAL LOW (ref 22–32)
Calcium: 9.3 mg/dL (ref 8.9–10.3)
Creatinine: 1.22 mg/dL (ref 0.61–1.24)
GFR, Est AFR Am: >60 mL/min (ref >60)
GFR, Estimated: >60 mL/min (ref >60)
Glucose, Bld: 288 mg/dL — ABNORMAL HIGH (ref 70–99)
POTASSIUM: 4.6 mmol/L (ref 3.5–5.1)
Sodium: 138 mmol/L (ref 135–145)

## 2019-03-18 NOTE — Telephone Encounter (Signed)
-----   Message from Wyatt Portela, MD sent at 03/18/2019  9:17 AM EDT ----- Please let him know his K is normal. He is stop K supplement at this time. I will discuss resumption of zytiga and K supplement next MD visit.

## 2019-03-18 NOTE — Telephone Encounter (Signed)
Attempted to relay message about K lab value being normal and to advise patient to stop K supplement at this time per Dr. [name].    Had to leave a message requesting call back to advise.  Dr [name] also wanted to advise patient that he will discuss resumption of Zytiga and potassium at his next visit.

## 2019-03-20 DIAGNOSIS — Z86010 Personal history of colonic polyps: Secondary | ICD-10-CM | POA: Diagnosis not present

## 2019-03-20 DIAGNOSIS — Z1211 Encounter for screening for malignant neoplasm of colon: Secondary | ICD-10-CM | POA: Diagnosis not present

## 2019-03-20 DIAGNOSIS — K635 Polyp of colon: Secondary | ICD-10-CM | POA: Diagnosis not present

## 2019-03-20 DIAGNOSIS — D125 Benign neoplasm of sigmoid colon: Secondary | ICD-10-CM | POA: Diagnosis not present

## 2019-03-20 DIAGNOSIS — D123 Benign neoplasm of transverse colon: Secondary | ICD-10-CM | POA: Diagnosis not present

## 2019-04-03 ENCOUNTER — Inpatient Hospital Stay: Payer: BLUE CROSS/BLUE SHIELD | Attending: Radiation Oncology

## 2019-04-03 ENCOUNTER — Other Ambulatory Visit: Payer: Self-pay

## 2019-04-03 ENCOUNTER — Telehealth: Payer: Self-pay | Admitting: Oncology

## 2019-04-03 ENCOUNTER — Inpatient Hospital Stay: Payer: BLUE CROSS/BLUE SHIELD

## 2019-04-03 ENCOUNTER — Inpatient Hospital Stay (HOSPITAL_BASED_OUTPATIENT_CLINIC_OR_DEPARTMENT_OTHER): Payer: BLUE CROSS/BLUE SHIELD | Admitting: Oncology

## 2019-04-03 VITALS — BP 119/70 | HR 74 | Temp 98.0°F | Resp 17 | Ht 72.0 in | Wt 203.7 lb

## 2019-04-03 DIAGNOSIS — C61 Malignant neoplasm of prostate: Secondary | ICD-10-CM

## 2019-04-03 DIAGNOSIS — E119 Type 2 diabetes mellitus without complications: Secondary | ICD-10-CM | POA: Diagnosis not present

## 2019-04-03 DIAGNOSIS — Z5111 Encounter for antineoplastic chemotherapy: Secondary | ICD-10-CM | POA: Diagnosis not present

## 2019-04-03 DIAGNOSIS — E291 Testicular hypofunction: Secondary | ICD-10-CM | POA: Diagnosis not present

## 2019-04-03 DIAGNOSIS — R531 Weakness: Secondary | ICD-10-CM

## 2019-04-03 DIAGNOSIS — R5383 Other fatigue: Secondary | ICD-10-CM

## 2019-04-03 DIAGNOSIS — I10 Essential (primary) hypertension: Secondary | ICD-10-CM

## 2019-04-03 DIAGNOSIS — M2550 Pain in unspecified joint: Secondary | ICD-10-CM

## 2019-04-03 LAB — CBC WITH DIFFERENTIAL (CANCER CENTER ONLY)
Abs Immature Granulocytes: 0.03 10*3/uL (ref 0.00–0.07)
Basophils Absolute: 0.1 10*3/uL (ref 0.0–0.1)
Basophils Relative: 1 %
Eosinophils Absolute: 0.4 10*3/uL (ref 0.0–0.5)
Eosinophils Relative: 4 %
HCT: 40.8 % (ref 39.0–52.0)
Hemoglobin: 13.1 g/dL (ref 13.0–17.0)
Immature Granulocytes: 0 %
Lymphocytes Relative: 29 %
Lymphs Abs: 2.8 10*3/uL (ref 0.7–4.0)
MCH: 27.8 pg (ref 26.0–34.0)
MCHC: 32.1 g/dL (ref 30.0–36.0)
MCV: 86.6 fL (ref 80.0–100.0)
Monocytes Absolute: 0.7 10*3/uL (ref 0.1–1.0)
Monocytes Relative: 7 %
Neutro Abs: 5.8 10*3/uL (ref 1.7–7.7)
Neutrophils Relative %: 59 %
Platelet Count: 188 10*3/uL (ref 150–400)
RBC: 4.71 MIL/uL (ref 4.22–5.81)
RDW: 13.6 % (ref 11.5–15.5)
WBC Count: 9.9 10*3/uL (ref 4.0–10.5)
nRBC: 0 % (ref 0.0–0.2)

## 2019-04-03 LAB — CMP (CANCER CENTER ONLY)
ALT: 29 U/L (ref 0–44)
AST: 29 U/L (ref 15–41)
Albumin: 4.1 g/dL (ref 3.5–5.0)
Alkaline Phosphatase: 89 U/L (ref 38–126)
Anion gap: 12 (ref 5–15)
BUN: 20 mg/dL (ref 6–20)
CO2: 21 mmol/L — ABNORMAL LOW (ref 22–32)
Calcium: 10 mg/dL (ref 8.9–10.3)
Chloride: 104 mmol/L (ref 98–111)
Creatinine: 1.23 mg/dL (ref 0.61–1.24)
GFR, Est AFR Am: >60 mL/min (ref >60)
GFR, Estimated: >60 mL/min (ref >60)
Glucose, Bld: 319 mg/dL — ABNORMAL HIGH (ref 70–99)
Potassium: 4.5 mmol/L (ref 3.5–5.1)
Sodium: 137 mmol/L (ref 135–145)
Total Bilirubin: 0.4 mg/dL (ref 0.3–1.2)
Total Protein: 7.9 g/dL (ref 6.5–8.1)

## 2019-04-03 MED ORDER — LEUPROLIDE ACETATE (4 MONTH) 30 MG IM KIT
30.0000 mg | PACK | Freq: Once | INTRAMUSCULAR | Status: AC
  Administered 2019-04-03: 30 mg via INTRAMUSCULAR
  Filled 2019-04-03: qty 30

## 2019-04-03 NOTE — Progress Notes (Signed)
Hematology and Oncology Follow Up Visit  Adam Cruz [id number] 08-25-1961 58 y.o. 04/03/2019 10:36 AM [name], MDSaeed, [name], MD   Principle Diagnosis: 58 year old man with advanced prostate cancer with disease involvement in the lymph nodes diagnosed in June 2018.  He has castration-sensitive after presenting with PSA of 66 and a Gleason score of 8 at the time of diagnosis.  Prior Therapy:  Androgen deprivation therapy started in July 2018.  Current therapy:  Lupron 30 mg every 4 months given on November 21, 2017.  His next injection is scheduled for April 2020  Zytiga 1000 mg daily started in August 2018.  His dose was reduced to 750 mg daily started in March 2019.  Therapy has been on hold since March 2020.  Interim History: [name] returns today for a follow-up visit.  Since the last visit, he was hospitalized and middle of March 2024 community-acquired pneumonia and was discharged on March 04, 2019.  Since his discharge, he reports improvement in his overall health although not quite back to normal.  He is ambulating without help of a cane or walker and without any falls or syncope.  He does report some weakness and some fatigue in his lower extremities and back.  His appetite and performance status remains reasonable.  Patient denied any alteration mental status, neuropathy, confusion or dizziness.  Denies any headaches or lethargy.  Denies any night sweats, weight loss or changes in appetite.  Denied orthopnea, dyspnea on exertion or chest discomfort.  Denies shortness of breath, difficulty breathing hemoptysis or cough.  Denies any abdominal distention, nausea, early satiety or dyspepsia.  Denies any hematuria, frequency, dysuria or nocturia.  Denies any skin irritation, dryness or rash.  Denies any ecchymosis or petechiae.  Denies any lymphadenopathy or clotting.  Denies any heat or cold intolerance.  Denies any anxiety or depression.  Remaining review of system is  negative.     Medications: I have reviewed the patient's current medications.  Current Outpatient Medications  Medication Sig Dispense Refill  . amitriptyline (ELAVIL) 25 MG tablet Take 25 mg by mouth every evening.     Marland Kitchen amLODipine (NORVASC) 5 MG tablet Take 5 mg by mouth daily.    . ASPIRIN 81 PO Take 81 mg by mouth at bedtime.   6  . carvedilol (COREG) 25 MG tablet Take 25 mg by mouth 2 (two) times daily.    . Cholecalciferol (VITAMIN D3) 5000 units CAPS Take 1 capsule by mouth daily.     . fluticasone (FLONASE) 50 MCG/ACT nasal spray Place 1 spray into both nostrils daily.    . Gabapentin Enacarbil (HORIZANT) 600 MG TBCR Take 600 mg by mouth 2 (two) times daily.     . Insulin Glargine-Lixisenatide 100-33 UNT-MCG/ML SOPN Inject 65 Units into the skin daily.     Marland Kitchen JARDIANCE 25 MG TABS tablet Take 25 mg by mouth daily.   4  . levothyroxine (SYNTHROID, LEVOTHROID) 112 MCG tablet Take 112 mcg by mouth daily.    Marland Kitchen lisinopril (PRINIVIL,ZESTRIL) 40 MG tablet Take 40 mg by mouth daily.    Marland Kitchen NOVOLOG FLEXPEN 100 UNIT/ML FlexPen Inject 12 Units into the skin daily as needed.    Marland Kitchen omeprazole (PRILOSEC) 40 MG capsule Take 40 mg by mouth daily.     . potassium chloride (K-DUR) 10 MEQ tablet Take 8 tablets (80 mEq total) by mouth 3 (three) times daily. 60 tablet 1  . rosuvastatin (CRESTOR) 40 MG tablet     . ticagrelor (  BRILINTA) 90 MG TABS tablet Take 90 mg by mouth 2 (two) times daily.     Marland Kitchen ZYTIGA 250 MG tablet TAKE 3 TABLETS BY MOUTH EVERY DAY ON AN EMPTY STOMACH 1 HOUR BEFORE OR 2 HOURS AFTER A MEAL 90 tablet 0   No current facility-administered medications for this visit.      Allergies: No Known Allergies  Past Medical History, Surgical history, Social history, and Family History are unchanged.  He denies any home or tobacco use.  Physical Exam:   Blood pressure 119/70, pulse 74, temperature 98 F (36.7 C), temperature source Oral, resp. rate 17, height 6' (1.829 m), weight 203 lb  11.2 oz (92.4 kg), SpO2 99 %.    ECOG: 1       General appearance: Alert, awake without any distress. Head: Atraumatic without abnormalities Oropharynx: Without any thrush or ulcers. Eyes: No scleral icterus. Lymph nodes: No lymphadenopathy noted in the cervical, supraclavicular, or axillary nodes Heart:regular rate and rhythm, without any murmurs or gallops.   Lung: Clear to auscultation without any rhonchi, wheezes or dullness to percussion. Abdomin: Soft, nontender without any shifting dullness or ascites. Musculoskeletal: No clubbing or cyanosis. Neurological: No motor or sensory deficits. Skin: No rashes or lesions.      Lab Results: Lab Results  Component Value Date   WBC 9.5 03/06/2019   HGB 11.8 (L) 03/06/2019   HCT 35.1 (L) 03/06/2019   MCV 84.0 03/06/2019   PLT 237 03/06/2019     Chemistry      Component Value Date/Time   NA 138 03/18/2019 0800   NA 140 11/21/2017 1444   K 4.6 03/18/2019 0800   K 3.6 11/21/2017 1444   CL 106 03/18/2019 0800   CO2 21 (L) 03/18/2019 0800   CO2 25 11/21/2017 1444   BUN 20 03/18/2019 0800   BUN 14.2 11/21/2017 1444   CREATININE 1.22 03/18/2019 0800   CREATININE 1.1 11/21/2017 1444      Component Value Date/Time   CALCIUM 9.3 03/18/2019 0800   CALCIUM 9.4 11/21/2017 1444   ALKPHOS 82 03/11/2019 0724   ALKPHOS 119 11/21/2017 1444   AST 62 (H) 03/11/2019 0724   AST 29 11/21/2017 1444   ALT 48 (H) 03/11/2019 0724   ALT 32 11/21/2017 1444   BILITOT 0.4 03/11/2019 0724   BILITOT 0.40 11/21/2017 1444       Results for RENALDO, GORNICK (MRN [id number]) as of 04/03/2019 10:38  Ref. Range 01/29/2019 11:32 03/06/2019 08:11  Prostate Specific Ag, Serum Latest Ref Range: 0.0 - 4.0 ng/mL <0.1 <0.1    Impression and Plan:  58 year old man with:   1.  Advanced prostate cancer that is currently castration-sensitive with lymphadenopathy.  The natural course of this disease was discussed today in detail today with the  patient and with his wife over the phone.  His disease remains under excellent control with androgen deprivation therapy and has completed about a year and a half of Zytiga.  Risks and benefits of resuming Zytiga was discussed today and he opted to discontinue treatment at this time.  He has felt better off of it and the complications that came with it has not been worth it for him.  Alternative therapy including Xtandi and systemic chemotherapy was reviewed but these options deferred at this time unless he developed castration resistant disease.  The plan at this time is to withhold any additional treatment other than androgen deprivation.  I will repeat imaging studies for staging purposes in  June 2020.  2. Androgen deprivation therapy: Risks and benefits of continuing Lupron was discussed today.  Complications including weight gain fatigue were reviewed.  He is agreeable to continue which she will receive every 4 months.  3.  Hypokalemia: Resolved at this time after discontinuation of Zytiga.  Potassium will be checked regularly.  4.  Diabetes: Blood sugar has been under better control on Zytiga.  5.  Liver function test surveillance: He had fluctuations in his liver function test periodically.  These will be repeated today.  6.  Hypertension: Blood pressure is normal at this time and no further adjustment is needed.  7.  Arthralgias and muscle weakness: Likely related to deconditioning and Zytiga has been discontinued.  8. Follow-up: Will be in June 2020 for repeat evaluation and imaging studies.  25 minutes was spent with the patient face-to-face today.  More than 50% of time was dedicated to reviewing his disease status, treatment options and answering questions regarding future plan of care.  Zola Button, MD 4/15/202010:36 AM

## 2019-04-03 NOTE — Telephone Encounter (Signed)
Scheduled appt per 4/15 sch message - sent reminder letter in the mail with apt date and time

## 2019-04-03 NOTE — Patient Instructions (Signed)

## 2019-04-04 ENCOUNTER — Telehealth: Payer: Self-pay

## 2019-04-04 LAB — PROSTATE-SPECIFIC AG, SERUM (LABCORP): Prostate Specific Ag, Serum: <0.1 ng/mL (ref 0.0–4.0)

## 2019-04-04 NOTE — Telephone Encounter (Signed)
-----   Message from Wyatt Portela, MD sent at 04/04/2019  7:47 AM EDT ----- Please let him know his PSA is still low.

## 2019-04-04 NOTE — Telephone Encounter (Signed)
Contacted patient and made aware of PSA results. 

## 2019-04-05 ENCOUNTER — Other Ambulatory Visit: Payer: Self-pay | Admitting: Oncology

## 2019-04-11 DIAGNOSIS — E113513 Type 2 diabetes mellitus with proliferative diabetic retinopathy with macular edema, bilateral: Secondary | ICD-10-CM | POA: Diagnosis not present

## 2019-04-11 DIAGNOSIS — H542X11 Low vision right eye category 1, low vision left eye category 1: Secondary | ICD-10-CM | POA: Diagnosis not present

## 2019-04-11 DIAGNOSIS — H524 Presbyopia: Secondary | ICD-10-CM | POA: Diagnosis not present

## 2019-04-29 ENCOUNTER — Ambulatory Visit
Admission: RE | Admit: 2019-04-29 | Discharge: 2019-04-29 | Disposition: A | Discharge status: Home / Self Care | Payer: BLUE CROSS/BLUE SHIELD | Source: Ambulatory Visit | Attending: Oncology | Admitting: Oncology

## 2019-04-29 ENCOUNTER — Other Ambulatory Visit: Payer: Self-pay

## 2019-04-29 DIAGNOSIS — C61 Malignant neoplasm of prostate: Secondary | ICD-10-CM

## 2019-04-29 MED ORDER — IOPAMIDOL (ISOVUE-300) INJECTION 61%
100.0000 mL | Freq: Once | INTRAVENOUS | Status: AC | PRN
  Administered 2019-04-29: 100 mL via INTRAVENOUS

## 2019-05-01 ENCOUNTER — Telehealth: Payer: Self-pay

## 2019-05-01 NOTE — Telephone Encounter (Signed)
Contacted patient and left message with scan results as WNL and to call back with any questions or concerns.

## 2019-05-01 NOTE — Telephone Encounter (Signed)
-----   Message from Wyatt Portela, MD sent at 04/30/2019  1:29 PM EDT ----- Please let him know his scan is normal. No cancer detected.

## 2019-05-22 DIAGNOSIS — E113512 Type 2 diabetes mellitus with proliferative diabetic retinopathy with macular edema, left eye: Secondary | ICD-10-CM | POA: Diagnosis not present

## 2019-05-28 ENCOUNTER — Ambulatory Visit: Payer: BLUE CROSS/BLUE SHIELD | Admitting: Oncology

## 2019-05-29 DIAGNOSIS — E1142 Type 2 diabetes mellitus with diabetic polyneuropathy: Secondary | ICD-10-CM | POA: Diagnosis not present

## 2019-05-29 DIAGNOSIS — E039 Hypothyroidism, unspecified: Secondary | ICD-10-CM | POA: Diagnosis not present

## 2019-05-29 DIAGNOSIS — I10 Essential (primary) hypertension: Secondary | ICD-10-CM | POA: Diagnosis not present

## 2019-05-29 DIAGNOSIS — Z794 Long term (current) use of insulin: Secondary | ICD-10-CM | POA: Diagnosis not present

## 2019-05-31 ENCOUNTER — Other Ambulatory Visit: Payer: Self-pay

## 2019-05-31 ENCOUNTER — Inpatient Hospital Stay: Payer: BC Managed Care – PPO | Attending: Radiation Oncology

## 2019-05-31 DIAGNOSIS — E119 Type 2 diabetes mellitus without complications: Secondary | ICD-10-CM | POA: Diagnosis not present

## 2019-05-31 DIAGNOSIS — Z794 Long term (current) use of insulin: Secondary | ICD-10-CM | POA: Diagnosis not present

## 2019-05-31 DIAGNOSIS — R531 Weakness: Secondary | ICD-10-CM | POA: Diagnosis not present

## 2019-05-31 DIAGNOSIS — I10 Essential (primary) hypertension: Secondary | ICD-10-CM | POA: Diagnosis not present

## 2019-05-31 DIAGNOSIS — E291 Testicular hypofunction: Secondary | ICD-10-CM | POA: Insufficient documentation

## 2019-05-31 DIAGNOSIS — R600 Localized edema: Secondary | ICD-10-CM | POA: Diagnosis not present

## 2019-05-31 DIAGNOSIS — C61 Malignant neoplasm of prostate: Secondary | ICD-10-CM

## 2019-05-31 LAB — CMP (CANCER CENTER ONLY)
ALT: 33 U/L (ref 0–44)
AST: 25 U/L (ref 15–41)
Albumin: 3.8 g/dL (ref 3.5–5.0)
Alkaline Phosphatase: 68 U/L (ref 38–126)
Anion gap: 9 (ref 5–15)
BUN: 24 mg/dL — ABNORMAL HIGH (ref 6–20)
CO2: 22 mmol/L (ref 22–32)
Calcium: 9.6 mg/dL (ref 8.9–10.3)
Chloride: 104 mmol/L (ref 98–111)
Creatinine: 1.46 mg/dL — ABNORMAL HIGH (ref 0.61–1.24)
GFR, Est AFR Am: >60 mL/min (ref >60)
GFR, Estimated: 53 mL/min — ABNORMAL LOW (ref >60)
Glucose, Bld: 328 mg/dL — ABNORMAL HIGH (ref 70–99)
Potassium: 5.4 mmol/L — ABNORMAL HIGH (ref 3.5–5.1)
Sodium: 135 mmol/L (ref 135–145)
Total Bilirubin: 0.3 mg/dL (ref 0.3–1.2)
Total Protein: 7.2 g/dL (ref 6.5–8.1)

## 2019-05-31 LAB — CBC WITH DIFFERENTIAL (CANCER CENTER ONLY)
Abs Immature Granulocytes: 0.03 10*3/uL (ref 0.00–0.07)
Basophils Absolute: 0.1 10*3/uL (ref 0.0–0.1)
Basophils Relative: 1 %
Eosinophils Absolute: 0.3 10*3/uL (ref 0.0–0.5)
Eosinophils Relative: 4 %
HCT: 37.2 % — ABNORMAL LOW (ref 39.0–52.0)
Hemoglobin: 11.8 g/dL — ABNORMAL LOW (ref 13.0–17.0)
Immature Granulocytes: 0 %
Lymphocytes Relative: 34 %
Lymphs Abs: 3 10*3/uL (ref 0.7–4.0)
MCH: 28 pg (ref 26.0–34.0)
MCHC: 31.7 g/dL (ref 30.0–36.0)
MCV: 88.2 fL (ref 80.0–100.0)
Monocytes Absolute: 0.6 10*3/uL (ref 0.1–1.0)
Monocytes Relative: 7 %
Neutro Abs: 4.9 10*3/uL (ref 1.7–7.7)
Neutrophils Relative %: 54 %
Platelet Count: 195 10*3/uL (ref 150–400)
RBC: 4.22 MIL/uL (ref 4.22–5.81)
RDW: 13.9 % (ref 11.5–15.5)
WBC Count: 8.9 10*3/uL (ref 4.0–10.5)
nRBC: 0 % (ref 0.0–0.2)

## 2019-06-01 LAB — PROSTATE-SPECIFIC AG, SERUM (LABCORP): Prostate Specific Ag, Serum: <0.1 ng/mL (ref 0.0–4.0)

## 2019-06-04 ENCOUNTER — Telehealth: Payer: Self-pay | Admitting: Oncology

## 2019-06-04 ENCOUNTER — Other Ambulatory Visit: Payer: Self-pay

## 2019-06-04 ENCOUNTER — Inpatient Hospital Stay (HOSPITAL_BASED_OUTPATIENT_CLINIC_OR_DEPARTMENT_OTHER): Payer: BC Managed Care – PPO | Admitting: Oncology

## 2019-06-04 VITALS — BP 101/65 | HR 82 | Temp 98.2°F | Resp 17 | Wt 212.1 lb

## 2019-06-04 DIAGNOSIS — I10 Essential (primary) hypertension: Secondary | ICD-10-CM

## 2019-06-04 DIAGNOSIS — M2550 Pain in unspecified joint: Secondary | ICD-10-CM

## 2019-06-04 DIAGNOSIS — R531 Weakness: Secondary | ICD-10-CM

## 2019-06-04 DIAGNOSIS — Z794 Long term (current) use of insulin: Secondary | ICD-10-CM

## 2019-06-04 DIAGNOSIS — E119 Type 2 diabetes mellitus without complications: Secondary | ICD-10-CM | POA: Diagnosis not present

## 2019-06-04 DIAGNOSIS — C61 Malignant neoplasm of prostate: Secondary | ICD-10-CM

## 2019-06-04 DIAGNOSIS — E291 Testicular hypofunction: Secondary | ICD-10-CM

## 2019-06-04 DIAGNOSIS — R600 Localized edema: Secondary | ICD-10-CM | POA: Diagnosis not present

## 2019-06-04 NOTE — Progress Notes (Signed)
Hematology and Oncology Follow Up Visit  Adam Cruz [id number] 1961/11/10 58 y.o. 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    Prior Therapy:  Androgen deprivation therapy started in July 2018.  Current therapy:  Lupron 30 mg every 4 months given on November 21, 2017.  His next injection is scheduled for April 2020  Zytiga 1000 mg daily started in August 2018.  His dose was reduced to 750 mg daily started in March 2019.  Therapy has been on hold since March 2020.  Interim History: Mr. Buffin is here for return evaluation.  Since the last visit, he reports no major changes in his health.  He continues to [employer] and has felt better off of it.  He continues to improve in his activity and decreasing his dizziness and unsteadiness.  He still have some weakness at times but overall his performance status is improving.  His appetite is excellent and has gained weight.  His lower extremity edema is also improved.  He denied headaches, blurry vision, syncope or seizures.  Denies any fevers, chills or sweats.  Denied chest pain, palpitation, orthopnea or leg edema.  Denied cough, wheezing or hemoptysis.  Denied nausea, vomiting or abdominal pain.  Denies any constipation or diarrhea.  Denies any frequency urgency or hesitancy.  Denies any arthralgias or myalgias.  Denies any skin rashes or lesions.  Denies any bleeding or clotting tendency.  Denies any easy bruising.  Denies any hair or nail changes.  Denies any anxiety or depression.  Remaining review of system is negative.       Medications: I have reviewed the patient's current medications.  Current Outpatient Medications  Medication Sig Dispense Refill  . amitriptyline (ELAVIL) 25 MG tablet Take 25 mg by mouth every evening.      Marland Kitchen amLODipine (NORVASC) 5 MG tablet Take 5 mg by mouth daily.    . ASPIRIN 81 PO Take 81 mg by mouth at bedtime.   6  . carvedilol (COREG) 25 MG tablet Take 25 mg by mouth 2 (two) times daily.    . Cholecalciferol (VITAMIN D3) 5000 units CAPS Take 1 capsule by mouth daily.     . fluticasone (FLONASE) 50 MCG/ACT nasal spray Place 1 spray into both nostrils daily.    . Gabapentin Enacarbil (HORIZANT) 600 MG TBCR Take 600 mg by mouth 2 (two) times daily.     . Insulin Glargine-Lixisenatide 100-33 UNT-MCG/ML SOPN Inject 65 Units into the skin daily.     Marland Kitchen JARDIANCE 25 MG TABS tablet Take 25 mg by mouth daily.   4  . levothyroxine (SYNTHROID, LEVOTHROID) 112 MCG tablet Take 112 mcg by mouth daily.    Marland Kitchen lisinopril (PRINIVIL,ZESTRIL) 40 MG tablet Take 40 mg by mouth daily.    Marland Kitchen NOVOLOG FLEXPEN 100 UNIT/ML FlexPen Inject 12 Units into the skin daily as needed.    Marland Kitchen omeprazole (PRILOSEC) 40 MG capsule Take 40 mg by mouth daily.     . potassium chloride (K-DUR) 10 MEQ tablet Take 8 tablets (80 mEq total) by mouth 3 (three) times daily. 60 tablet 1  . rosuvastatin (CRESTOR) 40 MG tablet     . ticagrelor (BRILINTA) 90 MG TABS tablet Take 90 mg by mouth 2 (two) times daily.     Marland Kitchen ZYTIGA 250 MG tablet TAKE  3 TABLETS BY MOUTH EVERY DAY ON AN EMPTY STOMACH 1 HOUR BEFORE OR 2 HOURS AFTER A MEAL 90 tablet 0   No current facility-administered medications for this visit.      Allergies: No Known Allergies  Past Medical History, Surgical history, Social history, and Family History are unchanged.  He denies any home or tobacco use.  Physical Exam:  Blood pressure 101/65, pulse 82, temperature 98.2 F (36.8 C), temperature source Tympanic, resp. rate 17, weight 212 lb 2 oz (96.2 kg), SpO2 100 %.      ECOG: 1     General appearance: Comfortable appearing without any discomfort Head: Normocephalic without any trauma Oropharynx: Mucous membranes are moist and pink without any thrush or ulcers. Eyes:  Pupils are equal and round reactive to light. Lymph nodes: No cervical, supraclavicular, inguinal or axillary lymphadenopathy.   Heart:regular rate and rhythm.  S1 and S2 without leg edema. Lung: Clear without any rhonchi or wheezes.  No dullness to percussion. Abdomin: Soft, nontender, nondistended with good bowel sounds.  No hepatosplenomegaly. Musculoskeletal: No joint deformity or effusion.  Full range of motion noted. Neurological: No deficits noted on motor, sensory and deep tendon reflex exam. Skin: No petechial rash or dryness.  Appeared moist.       Lab Results: Lab Results  Component Value Date   WBC 8.9 05/31/2019   HGB 11.8 (L) 05/31/2019   HCT 37.2 (L) 05/31/2019   MCV 88.2 05/31/2019   PLT 195 05/31/2019     Chemistry      Component Value Date/Time   NA 135 05/31/2019 1038   NA 140 11/21/2017 1444   K 5.4 (H) 05/31/2019 1038   K 3.6 11/21/2017 1444   CL 104 05/31/2019 1038   CO2 22 05/31/2019 1038   CO2 25 11/21/2017 1444   BUN 24 (H) 05/31/2019 1038   BUN 14.2 11/21/2017 1444   CREATININE 1.46 (H) 05/31/2019 1038   CREATININE 1.1 11/21/2017 1444      Component Value Date/Time   CALCIUM 9.6 05/31/2019 1038   CALCIUM 9.4 11/21/2017 1444   ALKPHOS 68 05/31/2019 1038   ALKPHOS 119 11/21/2017 1444   AST 25 05/31/2019 1038   AST 29 11/21/2017 1444   ALT 33 05/31/2019 1038   ALT 32 11/21/2017 1444   BILITOT 0.3 05/31/2019 1038   BILITOT 0.40 11/21/2017 1444      Results for JOVE, BEYL (MRN [id number]) as of 06/04/2019 12:54  Ref. Range 05/31/2019 10:38  Prostate Specific Ag, Serum Latest Ref Range: 0.0 - 4.0 ng/mL <0.1    EXAM: CT ABDOMEN AND PELVIS WITH CONTRAST  TECHNIQUE: Multidetector CT imaging of the abdomen and pelvis was performed using the standard protocol following bolus administration of intravenous contrast.  CONTRAST:  152mL ISOVUE-300 IOPAMIDOL (ISOVUE-300) INJECTION 61%  COMPARISON:  11/27/2018 CT  abdomen/pelvis.  FINDINGS: Lower chest: A few scattered solid pulmonary nodules at both lung bases, largest 5 mm in the right lower lobe (series 6/image 24) and 5 mm in the lingula (series 6/image 34), all stable since 02/16/2010 CT abdomen study, considered benign. No acute abnormality at the lung bases. Coronary atherosclerosis.  Hepatobiliary: Normal liver size. No liver mass. Normal gallbladder with no radiopaque cholelithiasis. No biliary ductal dilatation.  Pancreas: Normal, with no mass or duct dilation.  Spleen: Normal size. No mass.  Adrenals/Urinary Tract: Normal adrenals. Normal kidneys with no hydronephrosis and no renal mass. At least partial duplication of the left renal collecting system. Normal bladder.  Stomach/Bowel:  Normal non-distended stomach. Normal caliber small bowel with no small bowel wall thickening. Appendectomy. Normal large bowel with no diverticulosis, large bowel wall thickening or pericolonic fat stranding.  Vascular/Lymphatic: Mildly atherosclerotic nonaneurysmal abdominal aorta. Patent portal, splenic, hepatic and renal veins. No pathologically enlarged lymph nodes in the abdomen or pelvis. Top-normal size 0.9 cm left external iliac node (series 2/image 83), stable. Right external iliac 0.6 cm node (series 2/image 83) is decreased from 0.8 cm.  Reproductive: Normal size prostate with nonspecific internal prostatic calcifications.  Other: No pneumoperitoneum, ascites or focal fluid collection.  Musculoskeletal: No aggressive appearing focal osseous lesions. Tiny sclerotic lesions in the anterior right sixth rib and posterior left seventh rib are unchanged back to 02/16/2010 CT, more likely benign. Moderate lower lumbar spondylosis.  IMPRESSION: 1. No findings suspicious for metastatic disease in the abdomen or pelvis. 2. Top-normal size bilateral external iliac lymph nodes are stable to decreased. 3. Tiny sclerotic lesions in  the lower ribs bilaterally are stable back to 2011 CT, more likely benign. 4.  Aortic Atherosclerosis (ICD10-I70.0).   Impression and Plan:  58 year old man with:   1.  Castration-sensitive prostate cancer with lymphadenopathy diagnosed in 2018.    He has been on Zytiga in the past and opted to stop it.  Additional therapy in the form of chemotherapy and Xtandi also reviewed.  CT scan on Apr 29, 2019 was personally reviewed today discussed with the patient continues to show no evidence of any residual disease.  Risks and benefits of continuing androgen deprivation therapy alone versus additional therapy was reviewed again.  After discussion we have elected to continue with androgen deprivation therapy alone and using different salvage therapy he developed castration hyper resistant disease.  We will continue to monitor his PSA and repeat imaging studies in 6 to 12 months.  2. Androgen deprivation therapy: He continues to receive Lupron without any complications.  His next injection will be August and 2020.  Long-term complications including weight gain, hot flashes as well as sexual dysfunction.  3.  Hypokalemia: Resolved at this time.  I will ask him to discontinue any potassium supplements.  He reports she is not taking any at this time.  4.  Diabetes: Mildly elevated at this time. He continues to follow with primary care physician regarding this issue.  5.  Liver function test surveillance: Liver function test is normal at this time no further monitoring will be needed of Zytiga.  6.  Hypertension: Blood pressure is adequate at this time.  7.  Arthralgias and muscle weakness: Improved after Discontinuation of Zytiga.   8. Follow-up: Will be in 2 months for repeat evaluation.  25 minutes was spent with the patient face-to-face today.  More than 50% of time was spent on reviewing his disease status, treatment options and reviewing imaging studies.Zola Button, MD 6/16/20201:38 PM

## 2019-06-04 NOTE — Telephone Encounter (Signed)
Scheduled appt per 6/16 los.  Printed calendar and avs.

## 2019-06-12 DIAGNOSIS — Z86010 Personal history of colonic polyps: Secondary | ICD-10-CM | POA: Diagnosis not present

## 2019-06-12 DIAGNOSIS — Z539 Procedure and treatment not carried out, unspecified reason: Secondary | ICD-10-CM | POA: Diagnosis not present

## 2019-06-12 DIAGNOSIS — Z1211 Encounter for screening for malignant neoplasm of colon: Secondary | ICD-10-CM | POA: Diagnosis not present

## 2019-06-28 DIAGNOSIS — E039 Hypothyroidism, unspecified: Secondary | ICD-10-CM | POA: Diagnosis not present

## 2019-06-28 DIAGNOSIS — Z794 Long term (current) use of insulin: Secondary | ICD-10-CM | POA: Diagnosis not present

## 2019-06-28 DIAGNOSIS — E1142 Type 2 diabetes mellitus with diabetic polyneuropathy: Secondary | ICD-10-CM | POA: Diagnosis not present

## 2019-06-28 DIAGNOSIS — I10 Essential (primary) hypertension: Secondary | ICD-10-CM | POA: Diagnosis not present

## 2019-07-03 DIAGNOSIS — E113512 Type 2 diabetes mellitus with proliferative diabetic retinopathy with macular edema, left eye: Secondary | ICD-10-CM | POA: Diagnosis not present

## 2019-07-31 ENCOUNTER — Telehealth: Payer: Self-pay

## 2019-07-31 NOTE — Telephone Encounter (Signed)
Left vm about pre-screening questions for COVID-19.

## 2019-08-01 ENCOUNTER — Other Ambulatory Visit: Payer: Self-pay

## 2019-08-01 ENCOUNTER — Inpatient Hospital Stay: Payer: BC Managed Care – PPO | Attending: Radiation Oncology

## 2019-08-01 ENCOUNTER — Inpatient Hospital Stay: Payer: BC Managed Care – PPO

## 2019-08-01 ENCOUNTER — Inpatient Hospital Stay (HOSPITAL_BASED_OUTPATIENT_CLINIC_OR_DEPARTMENT_OTHER): Payer: BC Managed Care – PPO | Admitting: Oncology

## 2019-08-01 VITALS — BP 117/73 | HR 91 | Temp 98.3°F | Resp 16 | Ht 72.0 in | Wt 203.5 lb

## 2019-08-01 DIAGNOSIS — Z5111 Encounter for antineoplastic chemotherapy: Secondary | ICD-10-CM | POA: Diagnosis not present

## 2019-08-01 DIAGNOSIS — C61 Malignant neoplasm of prostate: Secondary | ICD-10-CM

## 2019-08-01 LAB — CMP (CANCER CENTER ONLY)
ALT: 21 U/L (ref 0–44)
AST: 20 U/L (ref 15–41)
Albumin: 3.9 g/dL (ref 3.5–5.0)
Alkaline Phosphatase: 49 U/L (ref 38–126)
Anion gap: 11 (ref 5–15)
BUN: 20 mg/dL (ref 6–20)
CO2: 22 mmol/L (ref 22–32)
Calcium: 9.9 mg/dL (ref 8.9–10.3)
Chloride: 105 mmol/L (ref 98–111)
Creatinine: 1.22 mg/dL (ref 0.61–1.24)
GFR, Est AFR Am: >60 mL/min (ref >60)
GFR, Estimated: >60 mL/min (ref >60)
Glucose, Bld: 235 mg/dL — ABNORMAL HIGH (ref 70–99)
Potassium: 4.8 mmol/L (ref 3.5–5.1)
Sodium: 138 mmol/L (ref 135–145)
Total Bilirubin: 0.3 mg/dL (ref 0.3–1.2)
Total Protein: 7.3 g/dL (ref 6.5–8.1)

## 2019-08-01 LAB — CBC WITH DIFFERENTIAL (CANCER CENTER ONLY)
Abs Immature Granulocytes: 0.05 10*3/uL (ref 0.00–0.07)
Basophils Absolute: 0.1 10*3/uL (ref 0.0–0.1)
Basophils Relative: 1 %
Eosinophils Absolute: 0.4 10*3/uL (ref 0.0–0.5)
Eosinophils Relative: 4 %
HCT: 37.6 % — ABNORMAL LOW (ref 39.0–52.0)
Hemoglobin: 12 g/dL — ABNORMAL LOW (ref 13.0–17.0)
Immature Granulocytes: 1 %
Lymphocytes Relative: 31 %
Lymphs Abs: 3 10*3/uL (ref 0.7–4.0)
MCH: 27.6 pg (ref 26.0–34.0)
MCHC: 31.9 g/dL (ref 30.0–36.0)
MCV: 86.4 fL (ref 80.0–100.0)
Monocytes Absolute: 0.7 10*3/uL (ref 0.1–1.0)
Monocytes Relative: 7 %
Neutro Abs: 5.6 10*3/uL (ref 1.7–7.7)
Neutrophils Relative %: 56 %
Platelet Count: 206 10*3/uL (ref 150–400)
RBC: 4.35 MIL/uL (ref 4.22–5.81)
RDW: 13.9 % (ref 11.5–15.5)
WBC Count: 9.8 10*3/uL (ref 4.0–10.5)
nRBC: 0 % (ref 0.0–0.2)

## 2019-08-01 MED ORDER — LEUPROLIDE ACETATE (4 MONTH) 30 MG IM KIT
30.0000 mg | PACK | Freq: Once | INTRAMUSCULAR | Status: AC
  Administered 2019-08-01: 30 mg via INTRAMUSCULAR
  Filled 2019-08-01: qty 30

## 2019-08-01 NOTE — Patient Instructions (Signed)
Leuprolide injection What is this medicine? LEUPROLIDE (loo PROE lide) is a man-made hormone. It is used to treat the symptoms of prostate cancer. This medicine may also be used to treat children with early onset of puberty. It may be used for other hormonal conditions. This medicine may be used for other purposes; ask your health care provider or pharmacist if you have questions. COMMON BRAND NAME(S): Lupron What should I tell my health care provider before I take this medicine? They need to know if you have any of these conditions:  diabetes  heart disease or previous heart attack  high blood pressure  high cholesterol  pain or difficulty passing urine  spinal cord metastasis  stroke  tobacco smoker  an unusual or allergic reaction to leuprolide, benzyl alcohol, other medicines, foods, dyes, or preservatives  pregnant or trying to get pregnant  breast-feeding How should I use this medicine? This medicine is for injection under the skin or into a muscle. You will be taught how to prepare and give this medicine. Use exactly as directed. Take your medicine at regular intervals. Do not take your medicine more often than directed. It is important that you put your used needles and syringes in a special sharps container. Do not put them in a trash can. If you do not have a sharps container, call your pharmacist or healthcare provider to get one. A special MedGuide will be given to you by the pharmacist with each prescription and refill. Be sure to read this information carefully each time. Talk to your pediatrician regarding the use of this medicine in children. While this medicine may be prescribed for children as young as 8 years for selected conditions, precautions do apply. Overdosage: If you think you have taken too much of this medicine contact a poison control center or emergency room at once. NOTE: This medicine is only for you. Do not share this medicine with others. What if  I miss a dose? If you miss a dose, take it as soon as you can. If it is almost time for your next dose, take only that dose. Do not take double or extra doses. What may interact with this medicine? Do not take this medicine with any of the following medications:  chasteberry This medicine may also interact with the following medications:  herbal or dietary supplements, like black cohosh or DHEA  male hormones, like estrogens or progestins and birth control pills, patches, rings, or injections  male hormones, like testosterone This list may not describe all possible interactions. Give your health care provider a list of all the medicines, herbs, non-prescription drugs, or dietary supplements you use. Also tell them if you smoke, drink alcohol, or use illegal drugs. Some items may interact with your medicine. What should I watch for while using this medicine? Visit your doctor or health care professional for regular checks on your progress. During the first week, your symptoms may get worse, but then will improve as you continue your treatment. You may get hot flashes, increased bone pain, increased difficulty passing urine, or an aggravation of nerve symptoms. Discuss these effects with your doctor or health care professional, some of them may improve with continued use of this medicine. Male patients may experience a menstrual cycle or spotting during the first 2 months of therapy with this medicine. If this continues, contact your doctor or health care professional. This medicine may increase blood sugar. Ask your healthcare provider if changes in diet or medicines are needed if   you have diabetes. What side effects may I notice from receiving this medicine? Side effects that you should report to your doctor or health care professional as soon as possible:  allergic reactions like skin rash, itching or hives, swelling of the face, lips, or tongue  breathing problems  chest  pain  depression or memory disorders  pain in your legs or groin  pain at site where injected  severe headache  signs and symptoms of high blood sugar such as being more thirsty or hungry or having to urinate more than normal. You may also feel very tired or have blurry vision  swelling of the feet and legs  visual changes  vomiting Side effects that usually do not require medical attention (report to your doctor or health care professional if they continue or are bothersome):  breast swelling or tenderness  decrease in sex drive or performance  diarrhea  hot flashes  loss of appetite  muscle, joint, or bone pains  nausea  redness or irritation at site where injected  skin problems or acne This list may not describe all possible side effects. Call your doctor for medical advice about side effects. You may report side effects to FDA at 1-800-FDA-1088. Where should I keep my medicine? Keep out of the reach of children. Store below 25 degrees C (77 degrees F). Do not freeze. Protect from light. Do not use if it is not clear or if there are particles present. Throw away any unused medicine after the expiration date. NOTE: This sheet is a summary. It may not cover all possible information. If you have questions about this medicine, talk to your doctor, pharmacist, or health care provider.  2020 Elsevier/Gold Standard (2018-10-04 09:52:48)  

## 2019-08-01 NOTE — Progress Notes (Signed)
Hematology and Oncology Follow Up Visit  [name] [id number] 1961/05/11 58 y.o. 08/01/2019 10:06 AM [name], MDSaeed, [name], MD   Principle Diagnosis: 58 year old man with advanced prostate cancer with lymphadenopathy diagnosed in June 2018.  He presented with castration-sensitive disease as well as PSA of 66 and a Gleason score of 8.    Prior Therapy:  Androgen deprivation therapy started in July 2018.  Current therapy:  Lupron 30 mg every 4 months given on November 21, 2017.  His next injection is scheduled for April 2020  Zytiga 1000 mg daily started in August 2018.  His dose was reduced to 750 mg daily started in March 2019.  Therapy discontinued in March 2020.  Interim History: [name] returns today for a follow-up.  Since the last visit, he reports no major changes in his health.  He continues to do well after discontinuation of Zytiga.  His blood pressure is back to normal and does not require any blood pressure medication at this time.  His lisinopril has been used as needed.  He denies any arthralgias or myalgias at this time.  Denies recent falls or syncope.  He reports his blood sugar has been reasonably managed.  Patient denied any alteration mental status, neuropathy, confusion or dizziness.  Denies any headaches or lethargy.  Denies any night sweats, weight loss or changes in appetite.  Denied orthopnea, dyspnea on exertion or chest discomfort.  Denies shortness of breath, difficulty breathing hemoptysis or cough.  Denies any abdominal distention, nausea, early satiety or dyspepsia.  Denies any hematuria, frequency, dysuria or nocturia.  Denies any skin irritation, dryness or rash.  Denies any ecchymosis or petechiae.  Denies any lymphadenopathy or clotting.  Denies any heat or cold intolerance.  Denies any anxiety or depression.  Remaining review of system is negative.            Medications: Updated without any changes. Current Outpatient  Medications  Medication Sig Dispense Refill  . amitriptyline (ELAVIL) 25 MG tablet Take 25 mg by mouth every evening.     . ASPIRIN 81 PO Take 81 mg by mouth at bedtime.   6  . Cholecalciferol (VITAMIN D3) 5000 units CAPS Take 1 capsule by mouth daily.     . fluticasone (FLONASE) 50 MCG/ACT nasal spray Place 1 spray into both nostrils daily.    . Gabapentin Enacarbil (HORIZANT) 600 MG TBCR Take 600 mg by mouth 2 (two) times daily.     . Insulin Glargine-Lixisenatide 100-33 UNT-MCG/ML SOPN Inject 65 Units into the skin daily.     Marland Kitchen JARDIANCE 25 MG TABS tablet Take 25 mg by mouth daily.   4  . levothyroxine (SYNTHROID, LEVOTHROID) 112 MCG tablet Take 112 mcg by mouth daily.    Marland Kitchen lisinopril (PRINIVIL,ZESTRIL) 40 MG tablet Take 40 mg by mouth daily.    Marland Kitchen NOVOLOG FLEXPEN 100 UNIT/ML FlexPen Inject 12 Units into the skin daily as needed.    Marland Kitchen omeprazole (PRILOSEC) 40 MG capsule Take 40 mg by mouth daily.     . rosuvastatin (CRESTOR) 40 MG tablet     . ticagrelor (BRILINTA) 90 MG TABS tablet Take 90 mg by mouth 2 (two) times daily.     Marland Kitchen ZYTIGA 250 MG tablet TAKE 3 TABLETS BY MOUTH EVERY DAY ON AN EMPTY STOMACH 1 HOUR BEFORE OR 2 HOURS AFTER A MEAL (Patient not taking: Reported on 06/04/2019) 90 tablet 0   No current facility-administered medications for this visit.  Allergies: No Known Allergies  Past Medical History, Surgical history, Social history, and Family History reviewed without any changes.  Physical Exam:    Blood pressure 117/73, pulse 91, temperature 98.3 F (36.8 C), temperature source Temporal, resp. rate 16, height 6' (1.829 m), weight 203 lb 8 oz (92.3 kg), SpO2 100 %.     ECOG: 1    General appearance: Alert, awake without any distress. Head: Atraumatic without abnormalities Oropharynx: Without any thrush or ulcers. Eyes: No scleral icterus. Lymph nodes: No lymphadenopathy noted in the cervical, supraclavicular, or axillary nodes Heart:regular rate and  rhythm, without any murmurs or gallops.   Lung: Clear to auscultation without any rhonchi, wheezes or dullness to percussion. Abdomin: Soft, nontender without any shifting dullness or ascites. Musculoskeletal: No clubbing or cyanosis. Neurological: No motor or sensory deficits. Skin: No rashes or lesions.      Lab Results: Lab Results  Component Value Date   WBC 8.9 05/31/2019   HGB 11.8 (L) 05/31/2019   HCT 37.2 (L) 05/31/2019   MCV 88.2 05/31/2019   PLT 195 05/31/2019     Chemistry      Component Value Date/Time   NA 135 05/31/2019 1038   NA 140 11/21/2017 1444   K 5.4 (H) 05/31/2019 1038   K 3.6 11/21/2017 1444   CL 104 05/31/2019 1038   CO2 22 05/31/2019 1038   CO2 25 11/21/2017 1444   BUN 24 (H) 05/31/2019 1038   BUN 14.2 11/21/2017 1444   CREATININE 1.46 (H) 05/31/2019 1038   CREATININE 1.1 11/21/2017 1444      Component Value Date/Time   CALCIUM 9.6 05/31/2019 1038   CALCIUM 9.4 11/21/2017 1444   ALKPHOS 68 05/31/2019 1038   ALKPHOS 119 11/21/2017 1444   AST 25 05/31/2019 1038   AST 29 11/21/2017 1444   ALT 33 05/31/2019 1038   ALT 32 11/21/2017 1444   BILITOT 0.3 05/31/2019 1038   BILITOT 0.40 11/21/2017 1444       Impression and Plan:  58 year old man with:   1.  Advanced prostate cancer with lymphadenopathy diagnosed in 2018.  He continues to have castration-sensitive disease at this time.  He has discontinued Zytiga based on his wishes and remain on androgen deprivation therapy alone.  The natural course of this disease as well as alternative treatment option currently castration-sensitive disease was reviewed.  His PSA continues to be undetectable and has deferred any additional therapy for the time being.  2. Androgen deprivation therapy: Risks and benefits of continuing Lupron was reviewed today.  Complications including osteoporosis and hot flashes among others were reviewed.  He is agreeable to receive it today will be repeated in 4  months.  3.  Hypokalemia: He is last potassium was elevated and I recommended against any other supplemental potassium.  This will be checked today.  4.  Hypertension: Blood pressure is normal today without any antihypertensive medication.  5. Follow-up: In 4 months for repeat evaluation.   25 minutes was spent with the patient face-to-face today.  More than 50% of time was dedicated to reviewing his disease status, reviewing laboratory data as well as potential treatment options disease progression.  These also were discussed with his wife via phone.  Zola Button, MD 8/13/202010:06 AM

## 2019-08-02 ENCOUNTER — Telehealth: Payer: Self-pay

## 2019-08-02 LAB — PROSTATE-SPECIFIC AG, SERUM (LABCORP): Prostate Specific Ag, Serum: <0.1 ng/mL (ref 0.0–4.0)

## 2019-08-02 NOTE — Telephone Encounter (Signed)
Contacted patient and made aware of PSA results. 

## 2019-08-02 NOTE — Telephone Encounter (Signed)
-----   Message from Wyatt Portela, MD sent at 08/02/2019  8:20 AM EDT ----- Please let him know his PSA is low

## 2019-08-09 DIAGNOSIS — Z1211 Encounter for screening for malignant neoplasm of colon: Secondary | ICD-10-CM | POA: Diagnosis not present

## 2019-08-09 DIAGNOSIS — K635 Polyp of colon: Secondary | ICD-10-CM | POA: Diagnosis not present

## 2019-08-09 DIAGNOSIS — D123 Benign neoplasm of transverse colon: Secondary | ICD-10-CM | POA: Diagnosis not present

## 2019-08-09 DIAGNOSIS — Z86010 Personal history of colonic polyps: Secondary | ICD-10-CM | POA: Diagnosis not present

## 2019-08-09 DIAGNOSIS — D124 Benign neoplasm of descending colon: Secondary | ICD-10-CM | POA: Diagnosis not present

## 2019-08-14 DIAGNOSIS — H26493 Other secondary cataract, bilateral: Secondary | ICD-10-CM | POA: Diagnosis not present

## 2019-09-04 DIAGNOSIS — E113512 Type 2 diabetes mellitus with proliferative diabetic retinopathy with macular edema, left eye: Secondary | ICD-10-CM | POA: Diagnosis not present

## 2019-10-02 DIAGNOSIS — E113512 Type 2 diabetes mellitus with proliferative diabetic retinopathy with macular edema, left eye: Secondary | ICD-10-CM | POA: Diagnosis not present

## 2019-10-14 DIAGNOSIS — E113312 Type 2 diabetes mellitus with moderate nonproliferative diabetic retinopathy with macular edema, left eye: Secondary | ICD-10-CM | POA: Diagnosis not present

## 2019-10-14 DIAGNOSIS — E039 Hypothyroidism, unspecified: Secondary | ICD-10-CM | POA: Diagnosis not present

## 2019-10-14 DIAGNOSIS — Z13220 Encounter for screening for lipoid disorders: Secondary | ICD-10-CM | POA: Diagnosis not present

## 2019-10-14 DIAGNOSIS — Z23 Encounter for immunization: Secondary | ICD-10-CM | POA: Diagnosis not present

## 2019-10-14 DIAGNOSIS — E1140 Type 2 diabetes mellitus with diabetic neuropathy, unspecified: Secondary | ICD-10-CM | POA: Diagnosis not present

## 2019-10-14 DIAGNOSIS — E785 Hyperlipidemia, unspecified: Secondary | ICD-10-CM | POA: Diagnosis not present

## 2019-10-14 DIAGNOSIS — Z1331 Encounter for screening for depression: Secondary | ICD-10-CM | POA: Diagnosis not present

## 2019-10-14 DIAGNOSIS — I2510 Atherosclerotic heart disease of native coronary artery without angina pectoris: Secondary | ICD-10-CM | POA: Diagnosis not present

## 2019-10-14 DIAGNOSIS — Z0000 Encounter for general adult medical examination without abnormal findings: Secondary | ICD-10-CM | POA: Diagnosis not present

## 2019-10-25 ENCOUNTER — Telehealth: Payer: Self-pay | Admitting: Oncology

## 2019-10-25 NOTE — Telephone Encounter (Signed)
Called to follow up with patient regarding scheduling an upcoming appointment, per patient's request lab and follow up appointments have been made for 12/08.

## 2019-11-19 IMAGING — CT CT ABD-PELV W/ CM
2 of 5 series · 16 of 46 positions shown, 18 images · IV contrast (OMNIPAQUE)
Comparison: CT 510 19, bone scan 04/27/2018

CLINICAL DATA: Prostate cancer diagnosed 9104. Oral chemotherapy in
progress.

EXAM:
CT ABDOMEN AND PELVIS WITH CONTRAST
TECHNIQUE: Multidetector CT imaging of the abdomen and pelvis was performed
using the standard protocol following bolus administration of
intravenous contrast.
CONTRAST:  100mL OMNIPAQUE IOHEXOL 300 MG/ML  SOLN

[Series 2: axial st · axial · 0.83mm/px · z∈[-500,-45]mm · 13 of 106 slices shown, 15 images]
[im 8/106  soft-tissue]
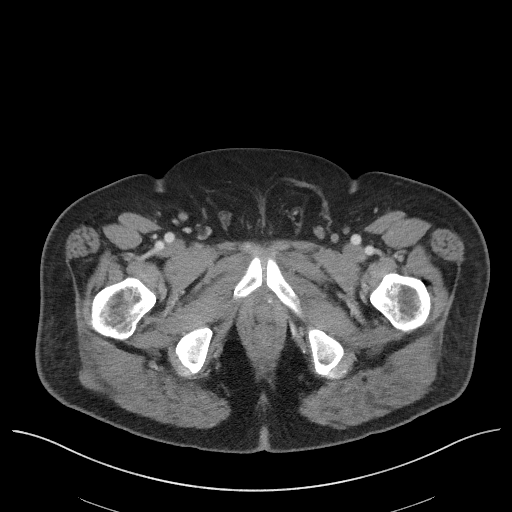
[im 8/106  bone]
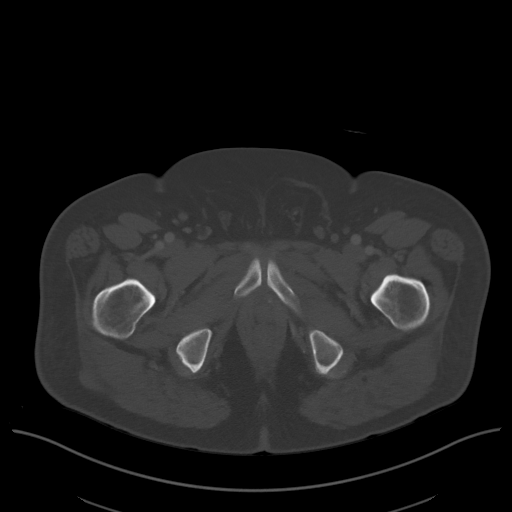
[im 15/106  soft-tissue]
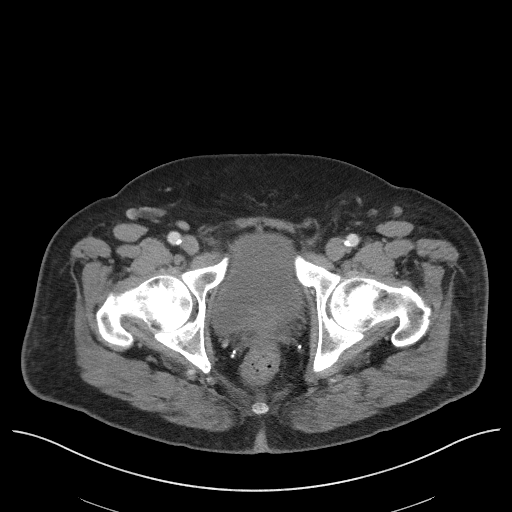
[im 22/106  soft-tissue]
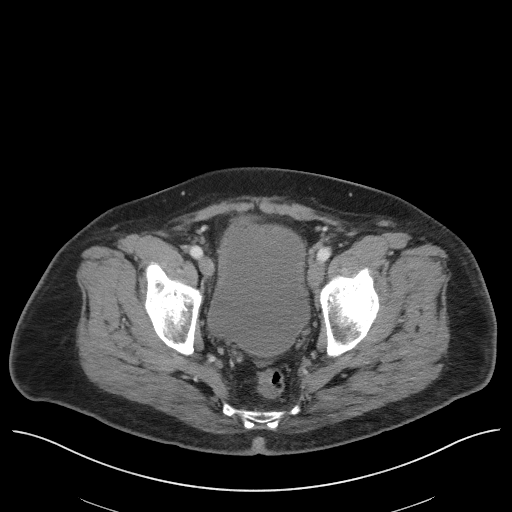
[im 29/106  soft-tissue]
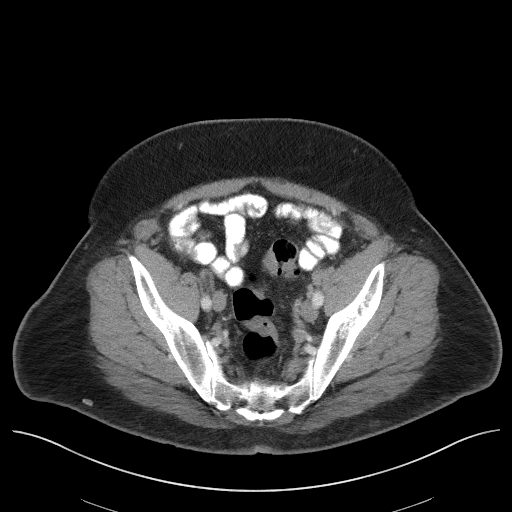
[im 36/106  soft-tissue]
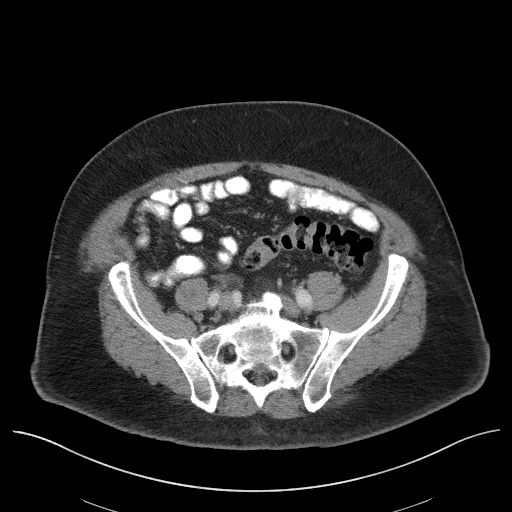
[im 43/106  soft-tissue]
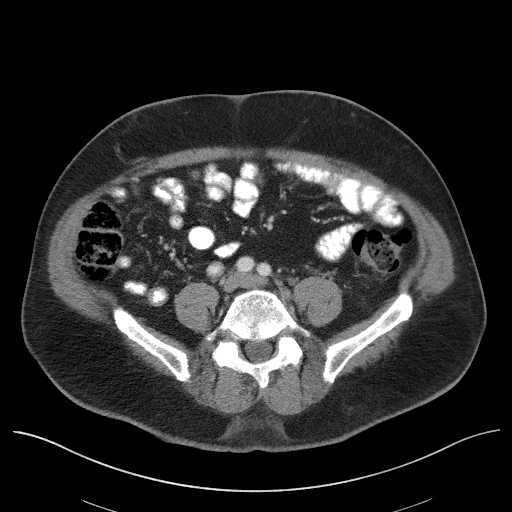
[im 57/106  soft-tissue]
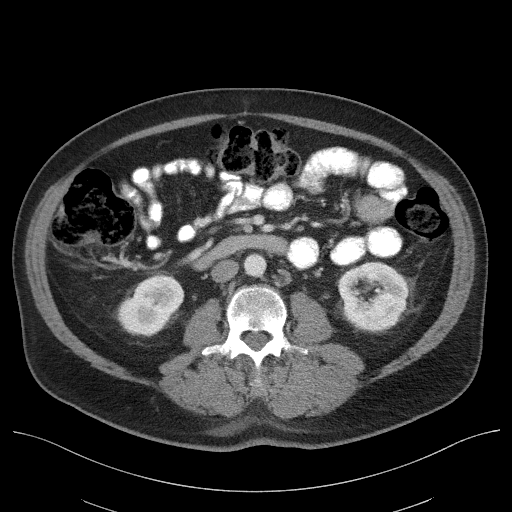
[im 64/106  soft-tissue]
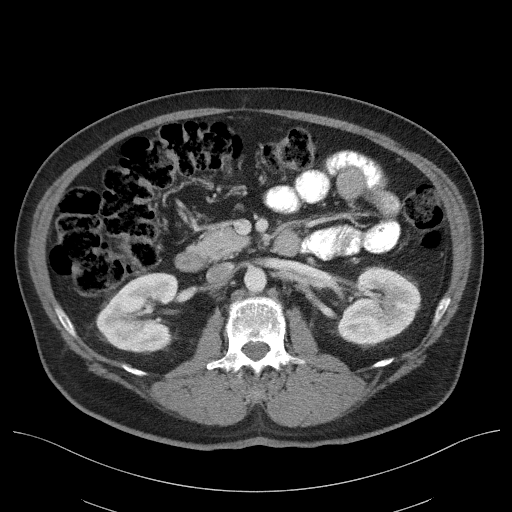
[im 71/106  soft-tissue]
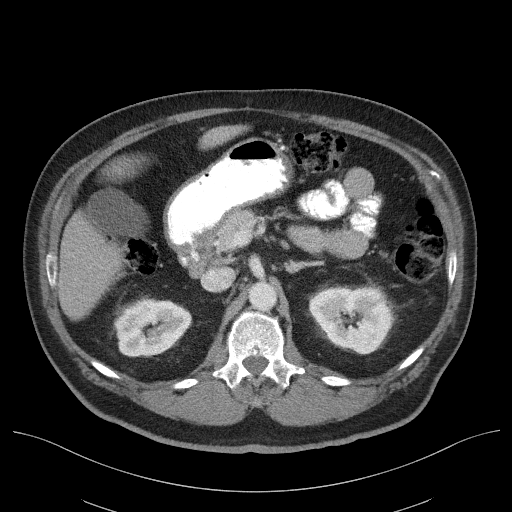
[im 71/106  bone]
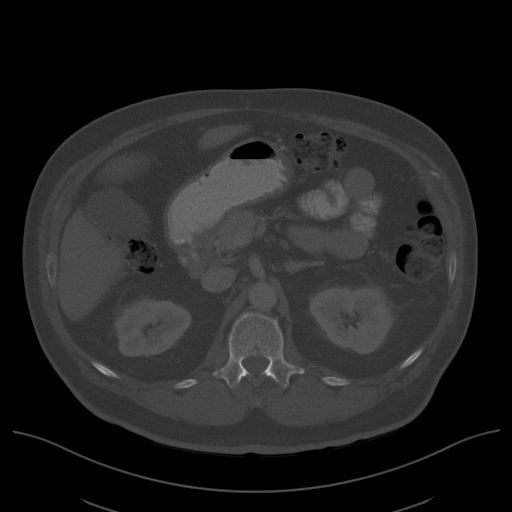
[im 78/106  soft-tissue]
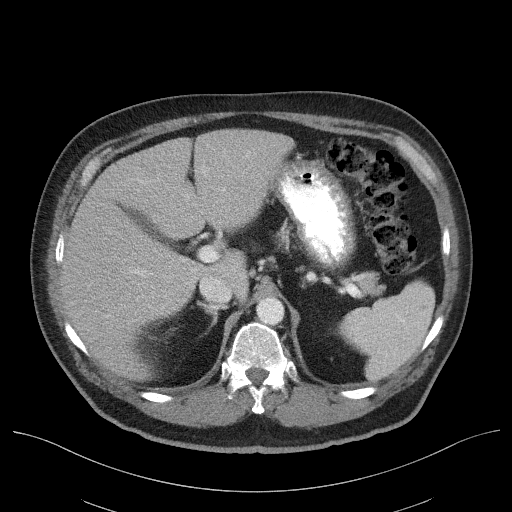
[im 85/106  soft-tissue]
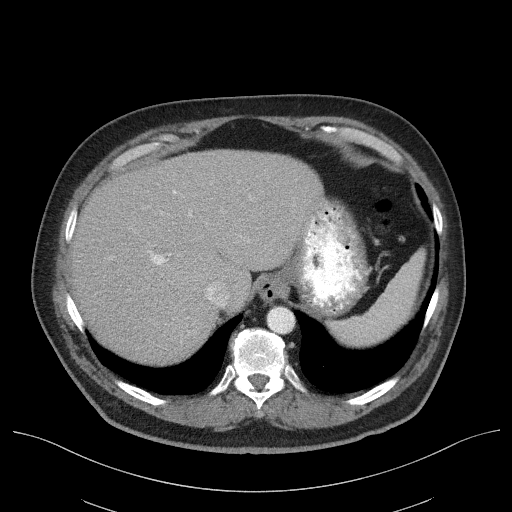
[im 92/106  soft-tissue]
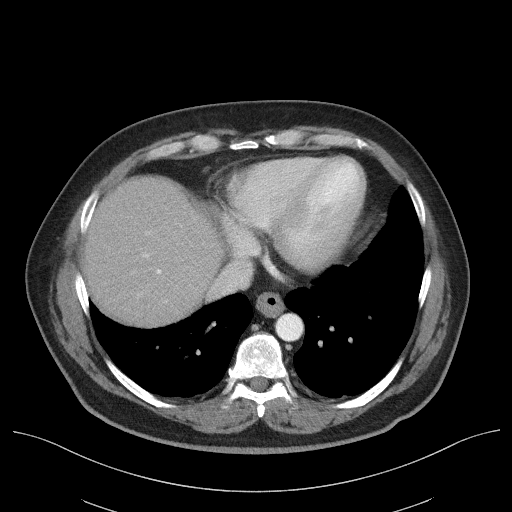
[im 99/106  soft-tissue]
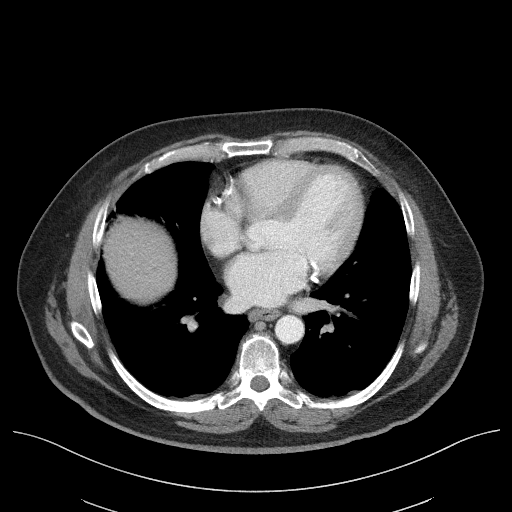

[Series 5: coronal st · coronal · 0.83mm/px · 3 of 105 slices shown]
[im 35/105  soft-tissue]
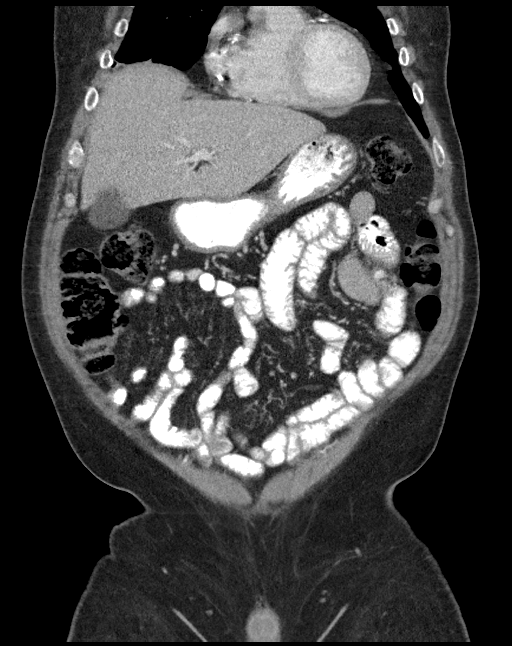
[im 47/105  soft-tissue]
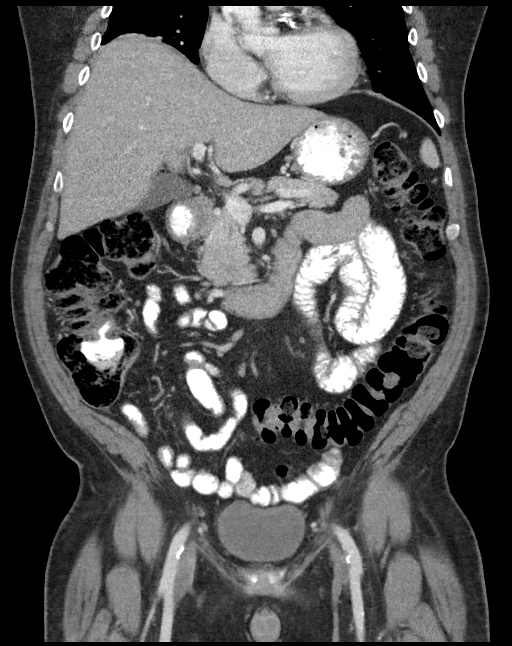
[im 58/105  soft-tissue]
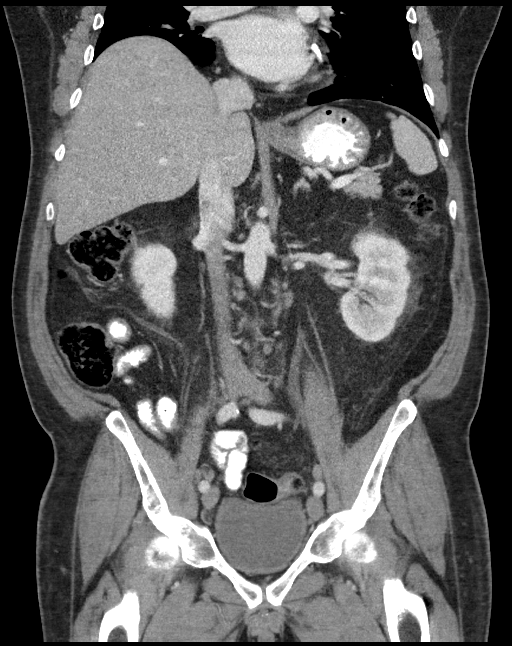

[16 of 46 positions shown; findings below may reference images not displayed]

FINDINGS: Lower chest: Lung bases are clear.

Hepatobiliary: No focal hepatic lesion. No biliary duct dilatation.
Gallbladder is normal. Common bile duct is normal.

Pancreas: Pancreas is normal. No ductal dilatation. No pancreatic
inflammation.

Spleen: Normal spleen

Adrenals/urinary tract: Adrenal glands and kidneys are normal. The
ureters and bladder are normal.

Stomach/Bowel: Stomach, small-bowel cecum normal. Post appendectomy.
The colon and rectosigmoid colon are normal.

Vascular/Lymphatic: Abdominal aorta normal caliber. Small
subcentimeter periaortic lymph nodes noted.

LEFT external iliac lymph node measuring 9 mm short axis (image
80/2) is not changed from 10 mm. Smaller LEFT external iliac lymph
node measures 8 mm also unchanged. No new adenopathy.

Reproductive: Prostate small.  No periprosthetic adenopathy.

Other: No free fluid.

Musculoskeletal: Sclerotic lesion in the posterior LEFT seventh rib
measuring 10 mm (image [DATE] is unchanged. No new lesions in the
visualized skeleton
IMPRESSION: 1. No evidence of prostate cancer recurrence.
2. Stable borderline enlarged external iliac lymph nodes.
3. Stable small sclerotic lesion the posterior LEFT tenth rib.

## 2019-11-20 DIAGNOSIS — E113412 Type 2 diabetes mellitus with severe nonproliferative diabetic retinopathy with macular edema, left eye: Secondary | ICD-10-CM | POA: Diagnosis not present

## 2019-11-26 ENCOUNTER — Inpatient Hospital Stay: Payer: BC Managed Care – PPO | Attending: Oncology

## 2019-11-26 ENCOUNTER — Other Ambulatory Visit: Payer: Self-pay

## 2019-11-26 ENCOUNTER — Inpatient Hospital Stay (HOSPITAL_BASED_OUTPATIENT_CLINIC_OR_DEPARTMENT_OTHER): Payer: BC Managed Care – PPO | Admitting: Oncology

## 2019-11-26 ENCOUNTER — Inpatient Hospital Stay: Payer: BC Managed Care – PPO

## 2019-11-26 VITALS — BP 123/77 | HR 84 | Temp 98.9°F | Resp 17 | Ht 72.0 in | Wt 212.0 lb

## 2019-11-26 DIAGNOSIS — Z5111 Encounter for antineoplastic chemotherapy: Secondary | ICD-10-CM | POA: Diagnosis not present

## 2019-11-26 DIAGNOSIS — C61 Malignant neoplasm of prostate: Secondary | ICD-10-CM | POA: Diagnosis not present

## 2019-11-26 LAB — CMP (CANCER CENTER ONLY)
ALT: 26 U/L (ref 0–44)
AST: 23 U/L (ref 15–41)
Albumin: 4.4 g/dL (ref 3.5–5.0)
Alkaline Phosphatase: 54 U/L (ref 38–126)
Anion gap: 14 (ref 5–15)
BUN: 27 mg/dL — ABNORMAL HIGH (ref 6–20)
CO2: 23 mmol/L (ref 22–32)
Calcium: 10.5 mg/dL — ABNORMAL HIGH (ref 8.9–10.3)
Chloride: 104 mmol/L (ref 98–111)
Creatinine: 1.38 mg/dL — ABNORMAL HIGH (ref 0.61–1.24)
GFR, Est AFR Am: >60 mL/min (ref >60)
GFR, Estimated: 56 mL/min — ABNORMAL LOW (ref >60)
Glucose, Bld: 175 mg/dL — ABNORMAL HIGH (ref 70–99)
Potassium: 4.5 mmol/L (ref 3.5–5.1)
Sodium: 141 mmol/L (ref 135–145)
Total Bilirubin: 0.3 mg/dL (ref 0.3–1.2)
Total Protein: 8.3 g/dL — ABNORMAL HIGH (ref 6.5–8.1)

## 2019-11-26 LAB — CBC WITH DIFFERENTIAL (CANCER CENTER ONLY)
Abs Immature Granulocytes: 0.04 10*3/uL (ref 0.00–0.07)
Basophils Absolute: 0.1 10*3/uL (ref 0.0–0.1)
Basophils Relative: 1 %
Eosinophils Absolute: 0.3 10*3/uL (ref 0.0–0.5)
Eosinophils Relative: 2 %
HCT: 43.1 % (ref 39.0–52.0)
Hemoglobin: 13.7 g/dL (ref 13.0–17.0)
Immature Granulocytes: 0 %
Lymphocytes Relative: 28 %
Lymphs Abs: 3.4 10*3/uL (ref 0.7–4.0)
MCH: 27.5 pg (ref 26.0–34.0)
MCHC: 31.8 g/dL (ref 30.0–36.0)
MCV: 86.4 fL (ref 80.0–100.0)
Monocytes Absolute: 0.9 10*3/uL (ref 0.1–1.0)
Monocytes Relative: 7 %
Neutro Abs: 7.5 10*3/uL (ref 1.7–7.7)
Neutrophils Relative %: 62 %
Platelet Count: 233 10*3/uL (ref 150–400)
RBC: 4.99 MIL/uL (ref 4.22–5.81)
RDW: 15 % (ref 11.5–15.5)
WBC Count: 12.1 10*3/uL — ABNORMAL HIGH (ref 4.0–10.5)
nRBC: 0 % (ref 0.0–0.2)

## 2019-11-26 MED ORDER — LEUPROLIDE ACETATE (4 MONTH) 30 MG ~~LOC~~ KIT
30.0000 mg | PACK | Freq: Once | SUBCUTANEOUS | Status: AC
  Administered 2019-11-26: 16:00:00 30 mg via SUBCUTANEOUS
  Filled 2019-11-26: qty 30

## 2019-11-26 MED ORDER — LEUPROLIDE ACETATE (4 MONTH) 30 MG IM KIT: 30.0000 mg | PACK | Freq: Once | INTRAMUSCULAR | Status: DC

## 2019-11-26 NOTE — Patient Instructions (Signed)
Leuprolide injection What is this medicine? LEUPROLIDE (loo PROE lide) is a man-made hormone. It is used to treat the symptoms of prostate cancer. This medicine may also be used to treat children with early onset of puberty. It may be used for other hormonal conditions. This medicine may be used for other purposes; ask your health care provider or pharmacist if you have questions. COMMON BRAND NAME(S): Lupron What should I tell my health care provider before I take this medicine? They need to know if you have any of these conditions:  diabetes  heart disease or previous heart attack  high blood pressure  high cholesterol  pain or difficulty passing urine  spinal cord metastasis  stroke  tobacco smoker  an unusual or allergic reaction to leuprolide, benzyl alcohol, other medicines, foods, dyes, or preservatives  pregnant or trying to get pregnant  breast-feeding How should I use this medicine? This medicine is for injection under the skin or into a muscle. You will be taught how to prepare and give this medicine. Use exactly as directed. Take your medicine at regular intervals. Do not take your medicine more often than directed. It is important that you put your used needles and syringes in a special sharps container. Do not put them in a trash can. If you do not have a sharps container, call your pharmacist or healthcare provider to get one. A special MedGuide will be given to you by the pharmacist with each prescription and refill. Be sure to read this information carefully each time. Talk to your pediatrician regarding the use of this medicine in children. While this medicine may be prescribed for children as young as 8 years for selected conditions, precautions do apply. Overdosage: If you think you have taken too much of this medicine contact a poison control center or emergency room at once. NOTE: This medicine is only for you. Do not share this medicine with others. What if  I miss a dose? If you miss a dose, take it as soon as you can. If it is almost time for your next dose, take only that dose. Do not take double or extra doses. What may interact with this medicine? Do not take this medicine with any of the following medications:  chasteberry This medicine may also interact with the following medications:  herbal or dietary supplements, like black cohosh or DHEA  male hormones, like estrogens or progestins and birth control pills, patches, rings, or injections  male hormones, like testosterone This list may not describe all possible interactions. Give your health care provider a list of all the medicines, herbs, non-prescription drugs, or dietary supplements you use. Also tell them if you smoke, drink alcohol, or use illegal drugs. Some items may interact with your medicine. What should I watch for while using this medicine? Visit your doctor or health care professional for regular checks on your progress. During the first week, your symptoms may get worse, but then will improve as you continue your treatment. You may get hot flashes, increased bone pain, increased difficulty passing urine, or an aggravation of nerve symptoms. Discuss these effects with your doctor or health care professional, some of them may improve with continued use of this medicine. Male patients may experience a menstrual cycle or spotting during the first 2 months of therapy with this medicine. If this continues, contact your doctor or health care professional. This medicine may increase blood sugar. Ask your healthcare provider if changes in diet or medicines are needed if   you have diabetes. What side effects may I notice from receiving this medicine? Side effects that you should report to your doctor or health care professional as soon as possible:  allergic reactions like skin rash, itching or hives, swelling of the face, lips, or tongue  breathing problems  chest  pain  depression or memory disorders  pain in your legs or groin  pain at site where injected  severe headache  signs and symptoms of high blood sugar such as being more thirsty or hungry or having to urinate more than normal. You may also feel very tired or have blurry vision  swelling of the feet and legs  visual changes  vomiting Side effects that usually do not require medical attention (report to your doctor or health care professional if they continue or are bothersome):  breast swelling or tenderness  decrease in sex drive or performance  diarrhea  hot flashes  loss of appetite  muscle, joint, or bone pains  nausea  redness or irritation at site where injected  skin problems or acne This list may not describe all possible side effects. Call your doctor for medical advice about side effects. You may report side effects to FDA at 1-800-FDA-1088. Where should I keep my medicine? Keep out of the reach of children. Store below 25 degrees C (77 degrees F). Do not freeze. Protect from light. Do not use if it is not clear or if there are particles present. Throw away any unused medicine after the expiration date. NOTE: This sheet is a summary. It may not cover all possible information. If you have questions about this medicine, talk to your doctor, pharmacist, or health care provider.  2020 Elsevier/Gold Standard (2018-10-04 09:52:48)  

## 2019-11-26 NOTE — Progress Notes (Signed)
Hematology and Oncology Follow Up Visit  Adam Cruz:[id number] July 23, 1961 58 y.o. 11/26/2019 3:03 PM Adam Cruz, MDSaeed, Adam Leo, MD   Principle Diagnosis: 58 year old man with castration-sensitive advanced prostate cancer with lymphadenopathy diagnosed in June 2018.  He was noted to have PSA of 66 and a Gleason score of 8 at the time of diagnosis.  Prior Therapy:  Androgen deprivation therapy started in July 2018.  Zytiga 1000 mg daily started in August 2018.  His dose was reduced to 750 mg daily started in March 2019.  Therapy discontinued in March 2020.  Current therapy:  Lupron 30 mg every 4 months given on November 21, 2017.  He received Lupron on August 01, 2019 and will receive Eligard on December 8 of 2020.    Interim History: Adam Cruz is here for a follow-up evaluation.  Since the last visit, he reports no major changes in his health.  He continues to perform most activities of daily living without any major decline.  His appetite has been reasonable without any changes in his performance status.  His weight has increased however.  In the last few weeks he is noticing more groin pain exacerbated by mobility.  He has been taking ibuprofen which have helped his symptoms.  He denied headaches, blurry vision, syncope or seizures.  Denies any fevers, chills or sweats.  Denied chest pain, palpitation, orthopnea or leg edema.  Denied cough, wheezing or hemoptysis.  Denied nausea, vomiting or abdominal pain.  Denies any constipation or diarrhea.  Denies any frequency urgency or hesitancy.  Denies any arthralgias or myalgias.  Denies any skin rashes or lesions.  Denies any bleeding or clotting tendency.  Denies any easy bruising.  Denies any hair or nail changes.  Denies any anxiety or depression.  Remaining review of system is negative.              Medications: Unchanged on review. Current Outpatient Medications  Medication Sig Dispense Refill  . amitriptyline  (ELAVIL) 25 MG tablet Take 25 mg by mouth every evening.     . ASPIRIN 81 PO Take 81 mg by mouth at bedtime.   6  . Cholecalciferol (VITAMIN D3) 5000 units CAPS Take 1 capsule by mouth daily.     . fluticasone (FLONASE) 50 MCG/ACT nasal spray Place 1 spray into both nostrils daily.    . Gabapentin Enacarbil (HORIZANT) 600 MG TBCR Take 600 mg by mouth 2 (two) times daily.     . Insulin Glargine-Lixisenatide 100-33 UNT-MCG/ML SOPN Inject 65 Units into the skin daily.     Marland Kitchen JARDIANCE 25 MG TABS tablet Take 25 mg by mouth daily.   4  . levothyroxine (SYNTHROID, LEVOTHROID) 112 MCG tablet Take 112 mcg by mouth daily.    Marland Kitchen lisinopril (PRINIVIL,ZESTRIL) 40 MG tablet Take 40 mg by mouth daily.    Marland Kitchen NOVOLOG FLEXPEN 100 UNIT/ML FlexPen Inject 12 Units into the skin daily as needed.    Marland Kitchen omeprazole (PRILOSEC) 40 MG capsule Take 40 mg by mouth daily.     . rosuvastatin (CRESTOR) 40 MG tablet     . ticagrelor (BRILINTA) 90 MG TABS tablet Take 90 mg by mouth 2 (two) times daily.     Marland Kitchen ZYTIGA 250 MG tablet TAKE 3 TABLETS BY MOUTH EVERY DAY ON AN EMPTY STOMACH 1 HOUR BEFORE OR 2 HOURS AFTER A MEAL (Patient not taking: Reported on 06/04/2019) 90 tablet 0   No current facility-administered medications for this visit.  Allergies: No Known Allergies  Past Medical History, Surgical history, Social history, and Family History updated without any changes.  Physical Exam:    Blood pressure 123/77, pulse 84, temperature 98.9 F (37.2 C), temperature source Temporal, resp. rate 17, height 6' (1.829 m), weight 212 lb (96.2 kg), SpO2 100 %.     ECOG: 1   General appearance: Comfortable appearing without any discomfort Head: Normocephalic without any trauma Oropharynx: Mucous membranes are moist and pink without any thrush or ulcers. Eyes: Pupils are equal and round reactive to light. Lymph nodes: No cervical, supraclavicular, inguinal or axillary lymphadenopathy.   Heart:regular rate and rhythm.  S1  and S2 without leg edema. Lung: Clear without any rhonchi or wheezes.  No dullness to percussion. Abdomin: Soft, nontender, nondistended with good bowel sounds.  No hepatosplenomegaly. Musculoskeletal: Full range of motion noted in his left hip.  No tenderness noted on palpation in the left inguinal area. Neurological: No deficits noted on motor, sensory and deep tendon reflex exam. Skin: No petechial rash or dryness.  Appeared moist.        Lab Results: Lab Results  Component Value Date   WBC 9.8 08/01/2019   HGB 12.0 (L) 08/01/2019   HCT 37.6 (L) 08/01/2019   MCV 86.4 08/01/2019   PLT 206 08/01/2019     Chemistry      Component Value Date/Time   NA 138 08/01/2019 0957   NA 140 11/21/2017 1444   K 4.8 08/01/2019 0957   K 3.6 11/21/2017 1444   CL 105 08/01/2019 0957   CO2 22 08/01/2019 0957   CO2 25 11/21/2017 1444   BUN 20 08/01/2019 0957   BUN 14.2 11/21/2017 1444   CREATININE 1.22 08/01/2019 0957   CREATININE 1.1 11/21/2017 1444      Component Value Date/Time   CALCIUM 9.9 08/01/2019 0957   CALCIUM 9.4 11/21/2017 1444   ALKPHOS 49 08/01/2019 0957   ALKPHOS 119 11/21/2017 1444   AST 20 08/01/2019 0957   AST 29 11/21/2017 1444   ALT 21 08/01/2019 0957   ALT 32 11/21/2017 1444   BILITOT 0.3 08/01/2019 0957   BILITOT 0.40 11/21/2017 1444       Impression and Plan:  58 year old man with:   1.  Castration-sensitive prostate cancer with disease involving lymphadenopathy diagnosed in 2018.     He continues to be on androgen deprivation therapy alone after discontinuation of Zytiga.  The natural course of this disease as well as the risk of relapse and developing castration hyper resistant disease was outlined today.  Additional therapy at that time including systemic chemotherapy, Gillermina Phy among others were reviewed.  These options will be deferred unless he developed castration resistant disease.  Potential complications related to these treatments include nausea,  fatigue and myelosuppression.  His PSA continues to be undetectable at this time and no signs or symptoms to suggest recurrent disease.    2. Androgen deprivation therapy: Long-term complication associated with androgen deprivation was updated today.  These would include weight gain, hot flashes among others.  He will receive that today and repeated in 4 months.  3.  Left groin pain: Appears to be musculoskeletal in nature but malignancy cannot be ruled out at this time.  We will obtain a CT scan of the abdomen and pelvis to rule out malignant etiology.  4.  Hypertension: No issues reported after discontinuation of Zytiga.  5. Follow-up: In 4 months for repeat evaluation.   25 minutes was spent with the patient  face-to-face today.  More than 50% of time was spent on updating his disease status, treatment options as well as potential complications but at the therapy.  Zola Button, MD 12/8/20203:03 PM

## 2019-11-27 ENCOUNTER — Telehealth: Payer: Self-pay | Admitting: Oncology

## 2019-11-27 ENCOUNTER — Telehealth: Payer: Self-pay

## 2019-11-27 LAB — PROSTATE-SPECIFIC AG, SERUM (LABCORP): Prostate Specific Ag, Serum: <0.1 ng/mL (ref 0.0–4.0)

## 2019-11-27 NOTE — Telephone Encounter (Signed)
-----   Message from Wyatt Portela, MD sent at 11/27/2019  8:12 AM EST ----- Please let him know his PSA is down.

## 2019-11-27 NOTE — Telephone Encounter (Signed)
Called and informed patient of information below. Patient verbalized understanding. Patient instructed to call office with any questions or concerns.

## 2019-11-27 NOTE — Telephone Encounter (Signed)
Scheduled per los. Called and left msg. Mailed printout  °

## 2019-12-05 ENCOUNTER — Other Ambulatory Visit: Payer: Self-pay

## 2019-12-05 ENCOUNTER — Ambulatory Visit (HOSPITAL_COMMUNITY)
Admission: RE | Admit: 2019-12-05 | Discharge: 2019-12-05 | Disposition: A | Discharge status: Home / Self Care | Payer: BC Managed Care – PPO | Source: Ambulatory Visit | Attending: Oncology | Admitting: Oncology

## 2019-12-05 DIAGNOSIS — C61 Malignant neoplasm of prostate: Secondary | ICD-10-CM | POA: Diagnosis not present

## 2019-12-05 MED ORDER — IOHEXOL 300 MG/ML  SOLN
100.0000 mL | Freq: Once | INTRAMUSCULAR | Status: AC | PRN
  Administered 2019-12-05: 10:00:00 100 mL via INTRAVENOUS

## 2019-12-05 MED ORDER — SODIUM CHLORIDE (PF) 0.9 % IJ SOLN
INTRAMUSCULAR | Status: AC
  Filled 2019-12-05: qty 50

## 2020-01-01 DIAGNOSIS — E113512 Type 2 diabetes mellitus with proliferative diabetic retinopathy with macular edema, left eye: Secondary | ICD-10-CM | POA: Diagnosis not present

## 2020-01-14 DIAGNOSIS — I10 Essential (primary) hypertension: Secondary | ICD-10-CM | POA: Diagnosis not present

## 2020-01-14 DIAGNOSIS — E782 Mixed hyperlipidemia: Secondary | ICD-10-CM | POA: Diagnosis not present

## 2020-01-14 DIAGNOSIS — R2689 Other abnormalities of gait and mobility: Secondary | ICD-10-CM | POA: Diagnosis not present

## 2020-01-14 DIAGNOSIS — E113599 Type 2 diabetes mellitus with proliferative diabetic retinopathy without macular edema, unspecified eye: Secondary | ICD-10-CM | POA: Diagnosis not present

## 2020-02-12 DIAGNOSIS — E113412 Type 2 diabetes mellitus with severe nonproliferative diabetic retinopathy with macular edema, left eye: Secondary | ICD-10-CM | POA: Diagnosis not present

## 2020-02-22 IMAGING — DX CHEST  1 VIEW
1 series · 1 of 1 positions shown · non-contrast
Comparison: 03/01/2019

CLINICAL DATA: Cough

EXAM:
CHEST  1 VIEW

[chest ap]
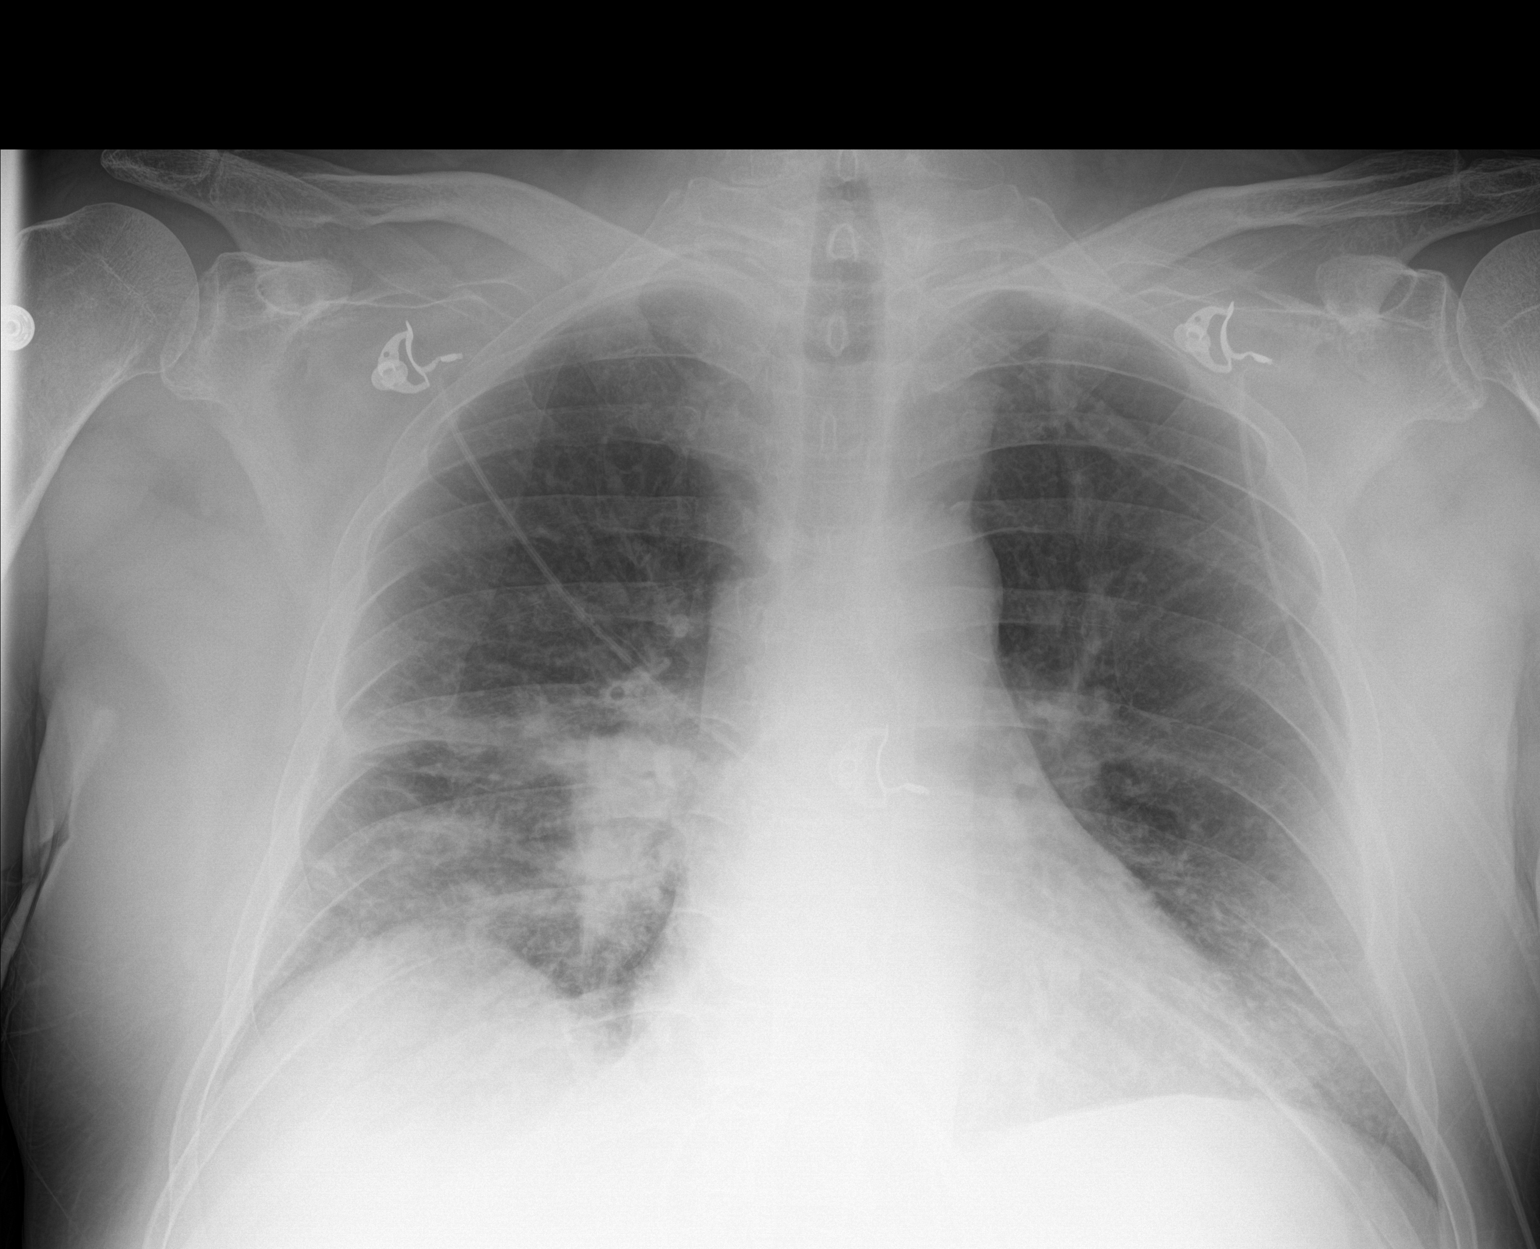

[1 of 1 positions shown; findings below may reference images not displayed]

FINDINGS: Bilateral lower lobe opacities, right greater than left,
new/progressive. No pleural effusion or pneumothorax.

The heart is normal in size.
IMPRESSION: Progressive bilateral lower lobe opacities, right greater than left,
compatible with pneumonia.

## 2020-03-25 ENCOUNTER — Other Ambulatory Visit: Payer: Self-pay

## 2020-03-25 DIAGNOSIS — M9903 Segmental and somatic dysfunction of lumbar region: Secondary | ICD-10-CM | POA: Diagnosis not present

## 2020-03-25 DIAGNOSIS — M9902 Segmental and somatic dysfunction of thoracic region: Secondary | ICD-10-CM | POA: Diagnosis not present

## 2020-03-25 DIAGNOSIS — M9905 Segmental and somatic dysfunction of pelvic region: Secondary | ICD-10-CM | POA: Diagnosis not present

## 2020-03-25 DIAGNOSIS — C61 Malignant neoplasm of prostate: Secondary | ICD-10-CM

## 2020-03-25 DIAGNOSIS — M545 Low back pain: Secondary | ICD-10-CM | POA: Diagnosis not present

## 2020-03-26 ENCOUNTER — Other Ambulatory Visit: Payer: Self-pay

## 2020-03-26 ENCOUNTER — Inpatient Hospital Stay: Payer: BC Managed Care – PPO

## 2020-03-26 ENCOUNTER — Inpatient Hospital Stay: Payer: BC Managed Care – PPO | Attending: Oncology | Admitting: Oncology

## 2020-03-26 VITALS — BP 119/75 | HR 86 | Temp 98.3°F | Resp 18 | Wt 213.3 lb

## 2020-03-26 DIAGNOSIS — Z5111 Encounter for antineoplastic chemotherapy: Secondary | ICD-10-CM | POA: Insufficient documentation

## 2020-03-26 DIAGNOSIS — C61 Malignant neoplasm of prostate: Secondary | ICD-10-CM | POA: Insufficient documentation

## 2020-03-26 LAB — CBC WITH DIFFERENTIAL (CANCER CENTER ONLY)
Abs Immature Granulocytes: 0.03 10*3/uL (ref 0.00–0.07)
Basophils Absolute: 0.1 10*3/uL (ref 0.0–0.1)
Basophils Relative: 1 %
Eosinophils Absolute: 0.2 10*3/uL (ref 0.0–0.5)
Eosinophils Relative: 3 %
HCT: 38.4 % — ABNORMAL LOW (ref 39.0–52.0)
Hemoglobin: 12.2 g/dL — ABNORMAL LOW (ref 13.0–17.0)
Immature Granulocytes: 0 %
Lymphocytes Relative: 27 %
Lymphs Abs: 2.2 10*3/uL (ref 0.7–4.0)
MCH: 27.3 pg (ref 26.0–34.0)
MCHC: 31.8 g/dL (ref 30.0–36.0)
MCV: 85.9 fL (ref 80.0–100.0)
Monocytes Absolute: 0.8 10*3/uL (ref 0.1–1.0)
Monocytes Relative: 10 %
Neutro Abs: 5 10*3/uL (ref 1.7–7.7)
Neutrophils Relative %: 59 %
Platelet Count: 206 10*3/uL (ref 150–400)
RBC: 4.47 MIL/uL (ref 4.22–5.81)
RDW: 14.6 % (ref 11.5–15.5)
WBC Count: 8.3 10*3/uL (ref 4.0–10.5)
nRBC: 0 % (ref 0.0–0.2)

## 2020-03-26 LAB — CMP (CANCER CENTER ONLY)
ALT: 19 U/L (ref 0–44)
AST: 18 U/L (ref 15–41)
Albumin: 3.9 g/dL (ref 3.5–5.0)
Alkaline Phosphatase: 55 U/L (ref 38–126)
Anion gap: 11 (ref 5–15)
BUN: 26 mg/dL — ABNORMAL HIGH (ref 6–20)
CO2: 22 mmol/L (ref 22–32)
Calcium: 10 mg/dL (ref 8.9–10.3)
Chloride: 107 mmol/L (ref 98–111)
Creatinine: 1.49 mg/dL — ABNORMAL HIGH (ref 0.61–1.24)
GFR, Est AFR Am: 59 mL/min — ABNORMAL LOW (ref >60)
GFR, Estimated: 51 mL/min — ABNORMAL LOW (ref >60)
Glucose, Bld: 165 mg/dL — ABNORMAL HIGH (ref 70–99)
Potassium: 4 mmol/L (ref 3.5–5.1)
Sodium: 140 mmol/L (ref 135–145)
Total Bilirubin: 0.4 mg/dL (ref 0.3–1.2)
Total Protein: 7.2 g/dL (ref 6.5–8.1)

## 2020-03-26 MED ORDER — LEUPROLIDE ACETATE (4 MONTH) 30 MG ~~LOC~~ KIT
30.0000 mg | PACK | Freq: Once | SUBCUTANEOUS | Status: AC
  Administered 2020-03-26: 30 mg via SUBCUTANEOUS
  Filled 2020-03-26: qty 30

## 2020-03-26 NOTE — Patient Instructions (Signed)
Leuprolide injection What is this medicine? LEUPROLIDE (loo PROE lide) is a man-made hormone. It is used to treat the symptoms of prostate cancer. This medicine may also be used to treat children with early onset of puberty. It may be used for other hormonal conditions. This medicine may be used for other purposes; ask your health care provider or pharmacist if you have questions. COMMON BRAND NAME(S): Lupron What should I tell my health care provider before I take this medicine? They need to know if you have any of these conditions:  diabetes  heart disease or previous heart attack  high blood pressure  high cholesterol  pain or difficulty passing urine  spinal cord metastasis  stroke  tobacco smoker  an unusual or allergic reaction to leuprolide, benzyl alcohol, other medicines, foods, dyes, or preservatives  pregnant or trying to get pregnant  breast-feeding How should I use this medicine? This medicine is for injection under the skin or into a muscle. You will be taught how to prepare and give this medicine. Use exactly as directed. Take your medicine at regular intervals. Do not take your medicine more often than directed. It is important that you put your used needles and syringes in a special sharps container. Do not put them in a trash can. If you do not have a sharps container, call your pharmacist or healthcare provider to get one. A special MedGuide will be given to you by the pharmacist with each prescription and refill. Be sure to read this information carefully each time. Talk to your pediatrician regarding the use of this medicine in children. While this medicine may be prescribed for children as young as 8 years for selected conditions, precautions do apply. Overdosage: If you think you have taken too much of this medicine contact a poison control center or emergency room at once. NOTE: This medicine is only for you. Do not share this medicine with others. What if  I miss a dose? If you miss a dose, take it as soon as you can. If it is almost time for your next dose, take only that dose. Do not take double or extra doses. What may interact with this medicine? Do not take this medicine with any of the following medications:  chasteberry This medicine may also interact with the following medications:  herbal or dietary supplements, like black cohosh or DHEA  male hormones, like estrogens or progestins and birth control pills, patches, rings, or injections  male hormones, like testosterone This list may not describe all possible interactions. Give your health care provider a list of all the medicines, herbs, non-prescription drugs, or dietary supplements you use. Also tell them if you smoke, drink alcohol, or use illegal drugs. Some items may interact with your medicine. What should I watch for while using this medicine? Visit your doctor or health care professional for regular checks on your progress. During the first week, your symptoms may get worse, but then will improve as you continue your treatment. You may get hot flashes, increased bone pain, increased difficulty passing urine, or an aggravation of nerve symptoms. Discuss these effects with your doctor or health care professional, some of them may improve with continued use of this medicine. Male patients may experience a menstrual cycle or spotting during the first 2 months of therapy with this medicine. If this continues, contact your doctor or health care professional. This medicine may increase blood sugar. Ask your healthcare provider if changes in diet or medicines are needed if   you have diabetes. What side effects may I notice from receiving this medicine? Side effects that you should report to your doctor or health care professional as soon as possible:  allergic reactions like skin rash, itching or hives, swelling of the face, lips, or tongue  breathing problems  chest  pain  depression or memory disorders  pain in your legs or groin  pain at site where injected  severe headache  signs and symptoms of high blood sugar such as being more thirsty or hungry or having to urinate more than normal. You may also feel very tired or have blurry vision  swelling of the feet and legs  visual changes  vomiting Side effects that usually do not require medical attention (report to your doctor or health care professional if they continue or are bothersome):  breast swelling or tenderness  decrease in sex drive or performance  diarrhea  hot flashes  loss of appetite  muscle, joint, or bone pains  nausea  redness or irritation at site where injected  skin problems or acne This list may not describe all possible side effects. Call your doctor for medical advice about side effects. You may report side effects to FDA at 1-800-FDA-1088. Where should I keep my medicine? Keep out of the reach of children. Store below 25 degrees C (77 degrees F). Do not freeze. Protect from light. Do not use if it is not clear or if there are particles present. Throw away any unused medicine after the expiration date. NOTE: This sheet is a summary. It may not cover all possible information. If you have questions about this medicine, talk to your doctor, pharmacist, or health care provider.  2020 Elsevier/Gold Standard (2018-10-04 09:52:48)  

## 2020-03-26 NOTE — Progress Notes (Signed)
Hematology and Oncology Follow Up Visit  RIC CRAFTS [id number] 29-May-1961 59 y.o. 03/26/2020 1:24 PM Adam Cruz, Adam Cruz, Adam Leo, MD   Principle Diagnosis: 59 year old man with advanced prostate cancer diagnosed in 2018.  He has castration-sensitive after presenting with lymphadenopathy, PSA of 66 and a Gleason score of 8.  Prior Therapy:  Androgen deprivation therapy started in July 2018.  Zytiga 1000 mg daily started in August 2018.  His dose was reduced to 750 mg daily started in March 2019.  Therapy discontinued in March 2020.  Current therapy:  Lupron 30 mg every 4 months given on November 21, 2017.  He received Lupron on August 01, 2019 and will receive Eligard on December 8 of 2020.    Interim History: Adam Cruz returns today for a follow-up visit.  Since the last visit, he reports no major changes in his health.  He does report intermittent back pain but manageable at this time.  He denies any weight gain and hot flashes.  He denies recent hospitalizations or illnesses.  His performance status and quality of life remain excellent.              Medications: Reviewed without changes. Current Outpatient Medications  Medication Sig Dispense Refill  . amitriptyline (ELAVIL) 25 MG tablet Take 25 mg by mouth every evening.     . ASPIRIN 81 PO Take 81 mg by mouth at bedtime.   6  . Cholecalciferol (VITAMIN D3) 5000 units CAPS Take 1 capsule by mouth daily.     . fluticasone (FLONASE) 50 MCG/ACT nasal spray Place 1 spray into both nostrils daily.    . Gabapentin Enacarbil (HORIZANT) 600 MG TBCR Take 600 mg by mouth 2 (two) times daily.     . Insulin Glargine-Lixisenatide 100-33 UNT-MCG/ML SOPN Inject 65 Units into the skin daily.     Marland Kitchen JARDIANCE 25 MG TABS tablet Take 25 mg by mouth daily.   4  . levothyroxine (SYNTHROID, LEVOTHROID) 112 MCG tablet Take 112 mcg by mouth daily.    Marland Kitchen lisinopril (PRINIVIL,ZESTRIL) 40 MG tablet Take 40 mg by mouth daily.    Marland Kitchen  NOVOLOG FLEXPEN 100 UNIT/ML FlexPen Inject 12 Units into the skin daily as needed.    Marland Kitchen omeprazole (PRILOSEC) 40 MG capsule Take 40 mg by mouth daily.     . rosuvastatin (CRESTOR) 40 MG tablet     . ticagrelor (BRILINTA) 90 MG TABS tablet Take 90 mg by mouth 2 (two) times daily.     Marland Kitchen ZYTIGA 250 MG tablet TAKE 3 TABLETS BY MOUTH EVERY DAY ON AN EMPTY STOMACH 1 HOUR BEFORE OR 2 HOURS AFTER A MEAL (Patient not taking: Reported on 06/04/2019) 90 tablet 0   No current facility-administered medications for this visit.     Allergies: No Known Allergies    Physical Exam:   Blood pressure 119/75, pulse 86, temperature 98.3 F (36.8 C), temperature source Temporal, resp. rate 18, weight 213 lb 4.8 oz (96.8 kg), SpO2 98 %.       ECOG: 1     General appearance: Alert, awake without any distress. Head: Atraumatic without abnormalities Oropharynx: Without any thrush or ulcers. Eyes: No scleral icterus. Lymph nodes: No lymphadenopathy noted in the cervical, supraclavicular, or axillary nodes Heart:regular rate and rhythm, without any murmurs or gallops.   Lung: Clear to auscultation without any rhonchi, wheezes or dullness to percussion. Abdomin: Soft, nontender without any shifting dullness or ascites. Musculoskeletal: No clubbing or cyanosis. Neurological: No motor or sensory deficits.  Skin: No rashes or lesions. .       Lab Results: Lab Results  Component Value Date   WBC 12.1 (H) 11/26/2019   HGB 13.7 11/26/2019   HCT 43.1 11/26/2019   MCV 86.4 11/26/2019   PLT 233 11/26/2019     Chemistry      Component Value Date/Time   NA 141 11/26/2019 1455   NA 140 11/21/2017 1444   K 4.5 11/26/2019 1455   K 3.6 11/21/2017 1444   CL 104 11/26/2019 1455   CO2 23 11/26/2019 1455   CO2 25 11/21/2017 1444   BUN 27 (H) 11/26/2019 1455   BUN 14.2 11/21/2017 1444   CREATININE 1.38 (H) 11/26/2019 1455   CREATININE 1.1 11/21/2017 1444      Component Value Date/Time   CALCIUM  10.5 (H) 11/26/2019 1455   CALCIUM 9.4 11/21/2017 1444   ALKPHOS 54 11/26/2019 1455   ALKPHOS 119 11/21/2017 1444   AST 23 11/26/2019 1455   AST 29 11/21/2017 1444   ALT 26 11/26/2019 1455   ALT 32 11/21/2017 1444   BILITOT 0.3 11/26/2019 1455   BILITOT 0.40 11/21/2017 1444     IMPRESSION: 1. No evidence of recurrent or metastatic carcinoma within the abdomen or pelvis. 2. Stable mild wall thickening of visualized portion of esophagus, suspicious for esophagitis. Consider upper endoscopy for further evaluation if clinically warranted. 3. Large stool burden noted; recommend clinical correlation for possible constipation.  Impression and Plan:  59 year old man with:   1.  Advanced prostate cancer with lymphadenopathy diagnosed in 2018.  He has castration-sensitive disease.   He continues to be on androgen deprivation therapy alone after Adam Cruz has been discontinued.  Treatment options moving forward and the rationale for therapy escalation was reiterated.  Potential options including Xtandi, systemic chemotherapy among others were reviewed.  CT scan obtained on December 05, 2019 was reviewed again and showed no evidence of any residual malignancy at this time.   He is agreeable to continue with androgen deprivation therapy alone.  Additional therapy will be introduced if he developed castration-resistant disease.  2. Androgen deprivation therapy: He is currently on Eligard which she will receive every 4 weeks.  Last injection was in December will receive one today.  Potential complications including weight gain, hot flashes and osteoporosis were reiterated.  Is agreeable to proceed.   3.  Covid vaccination: I encouraged him to obtain vaccination soon as possible.  Risks and benefits of these vaccinations were reviewed in setting of his malignancy related with the risk of virus infection and illness associated with it.  He is agreeable to proceed with your future.  4.   Hypertension: Blood pressure is back to normal range of Zytiga.  He does not require any antihypertensive medication.  5. Follow-up: He will return and 4 months for a follow-up.   30 minutes were dedicated to this visit today.  Time was spent on reviewing imaging studies, laboratory data, treatment options and future plan of care.  Zola Button, MD 4/8/20211:24 PM

## 2020-03-27 ENCOUNTER — Telehealth: Payer: Self-pay | Admitting: Oncology

## 2020-03-27 ENCOUNTER — Telehealth: Payer: Self-pay

## 2020-03-27 LAB — PROSTATE-SPECIFIC AG, SERUM (LABCORP): Prostate Specific Ag, Serum: <0.1 ng/mL (ref 0.0–4.0)

## 2020-03-27 NOTE — Telephone Encounter (Signed)
Called patient and let him know his PSA result. Patient verbalized understanding.  

## 2020-03-27 NOTE — Telephone Encounter (Signed)
Scheduled appt per 4/8 los.  Spoke with pt and they are aware of the appt date and time. 

## 2020-03-27 NOTE — Telephone Encounter (Signed)
-----   Message from Wyatt Portela, MD sent at 03/27/2020  8:07 AM EDT ----- Please let him know his PSA is still low

## 2020-04-01 DIAGNOSIS — M545 Low back pain: Secondary | ICD-10-CM | POA: Diagnosis not present

## 2020-04-01 DIAGNOSIS — M9902 Segmental and somatic dysfunction of thoracic region: Secondary | ICD-10-CM | POA: Diagnosis not present

## 2020-04-01 DIAGNOSIS — M9903 Segmental and somatic dysfunction of lumbar region: Secondary | ICD-10-CM | POA: Diagnosis not present

## 2020-04-01 DIAGNOSIS — M9905 Segmental and somatic dysfunction of pelvic region: Secondary | ICD-10-CM | POA: Diagnosis not present

## 2020-04-08 DIAGNOSIS — M9905 Segmental and somatic dysfunction of pelvic region: Secondary | ICD-10-CM | POA: Diagnosis not present

## 2020-04-08 DIAGNOSIS — M9902 Segmental and somatic dysfunction of thoracic region: Secondary | ICD-10-CM | POA: Diagnosis not present

## 2020-04-08 DIAGNOSIS — M9903 Segmental and somatic dysfunction of lumbar region: Secondary | ICD-10-CM | POA: Diagnosis not present

## 2020-04-08 DIAGNOSIS — M545 Low back pain: Secondary | ICD-10-CM | POA: Diagnosis not present

## 2020-04-13 DIAGNOSIS — E782 Mixed hyperlipidemia: Secondary | ICD-10-CM | POA: Diagnosis not present

## 2020-04-13 DIAGNOSIS — R809 Proteinuria, unspecified: Secondary | ICD-10-CM | POA: Diagnosis not present

## 2020-04-13 DIAGNOSIS — E113599 Type 2 diabetes mellitus with proliferative diabetic retinopathy without macular edema, unspecified eye: Secondary | ICD-10-CM | POA: Diagnosis not present

## 2020-04-13 DIAGNOSIS — I10 Essential (primary) hypertension: Secondary | ICD-10-CM | POA: Diagnosis not present

## 2020-04-15 DIAGNOSIS — M9903 Segmental and somatic dysfunction of lumbar region: Secondary | ICD-10-CM | POA: Diagnosis not present

## 2020-04-15 DIAGNOSIS — M9905 Segmental and somatic dysfunction of pelvic region: Secondary | ICD-10-CM | POA: Diagnosis not present

## 2020-04-15 DIAGNOSIS — M9902 Segmental and somatic dysfunction of thoracic region: Secondary | ICD-10-CM | POA: Diagnosis not present

## 2020-04-15 DIAGNOSIS — M545 Low back pain: Secondary | ICD-10-CM | POA: Diagnosis not present

## 2020-05-07 DIAGNOSIS — E113511 Type 2 diabetes mellitus with proliferative diabetic retinopathy with macular edema, right eye: Secondary | ICD-10-CM | POA: Diagnosis not present

## 2020-05-07 DIAGNOSIS — M6281 Muscle weakness (generalized): Secondary | ICD-10-CM | POA: Diagnosis not present

## 2020-05-21 DIAGNOSIS — M6281 Muscle weakness (generalized): Secondary | ICD-10-CM | POA: Diagnosis not present

## 2020-05-21 DIAGNOSIS — R2689 Other abnormalities of gait and mobility: Secondary | ICD-10-CM | POA: Diagnosis not present

## 2020-05-26 DIAGNOSIS — R2689 Other abnormalities of gait and mobility: Secondary | ICD-10-CM | POA: Diagnosis not present

## 2020-05-26 DIAGNOSIS — M6281 Muscle weakness (generalized): Secondary | ICD-10-CM | POA: Diagnosis not present

## 2020-05-27 DIAGNOSIS — M6281 Muscle weakness (generalized): Secondary | ICD-10-CM | POA: Diagnosis not present

## 2020-05-27 DIAGNOSIS — R2689 Other abnormalities of gait and mobility: Secondary | ICD-10-CM | POA: Diagnosis not present

## 2020-06-02 DIAGNOSIS — R2689 Other abnormalities of gait and mobility: Secondary | ICD-10-CM | POA: Diagnosis not present

## 2020-06-02 DIAGNOSIS — M6281 Muscle weakness (generalized): Secondary | ICD-10-CM | POA: Diagnosis not present

## 2020-06-04 DIAGNOSIS — R2689 Other abnormalities of gait and mobility: Secondary | ICD-10-CM | POA: Diagnosis not present

## 2020-06-04 DIAGNOSIS — M6281 Muscle weakness (generalized): Secondary | ICD-10-CM | POA: Diagnosis not present

## 2020-06-11 DIAGNOSIS — M6281 Muscle weakness (generalized): Secondary | ICD-10-CM | POA: Diagnosis not present

## 2020-06-11 DIAGNOSIS — R2689 Other abnormalities of gait and mobility: Secondary | ICD-10-CM | POA: Diagnosis not present

## 2020-06-12 DIAGNOSIS — M6281 Muscle weakness (generalized): Secondary | ICD-10-CM | POA: Diagnosis not present

## 2020-06-12 DIAGNOSIS — R2689 Other abnormalities of gait and mobility: Secondary | ICD-10-CM | POA: Diagnosis not present

## 2020-06-15 DIAGNOSIS — R2689 Other abnormalities of gait and mobility: Secondary | ICD-10-CM | POA: Diagnosis not present

## 2020-06-15 DIAGNOSIS — M6281 Muscle weakness (generalized): Secondary | ICD-10-CM | POA: Diagnosis not present

## 2020-06-16 DIAGNOSIS — M6281 Muscle weakness (generalized): Secondary | ICD-10-CM | POA: Diagnosis not present

## 2020-06-16 DIAGNOSIS — R2689 Other abnormalities of gait and mobility: Secondary | ICD-10-CM | POA: Diagnosis not present

## 2020-07-06 DIAGNOSIS — E113511 Type 2 diabetes mellitus with proliferative diabetic retinopathy with macular edema, right eye: Secondary | ICD-10-CM | POA: Diagnosis not present

## 2020-07-21 DIAGNOSIS — E039 Hypothyroidism, unspecified: Secondary | ICD-10-CM | POA: Diagnosis not present

## 2020-07-21 DIAGNOSIS — E113599 Type 2 diabetes mellitus with proliferative diabetic retinopathy without macular edema, unspecified eye: Secondary | ICD-10-CM | POA: Diagnosis not present

## 2020-07-21 DIAGNOSIS — R809 Proteinuria, unspecified: Secondary | ICD-10-CM | POA: Diagnosis not present

## 2020-07-21 DIAGNOSIS — I10 Essential (primary) hypertension: Secondary | ICD-10-CM | POA: Diagnosis not present

## 2020-07-21 DIAGNOSIS — E782 Mixed hyperlipidemia: Secondary | ICD-10-CM | POA: Diagnosis not present

## 2020-07-28 ENCOUNTER — Other Ambulatory Visit: Payer: Self-pay

## 2020-07-28 ENCOUNTER — Inpatient Hospital Stay: Payer: BC Managed Care – PPO | Attending: Oncology

## 2020-07-28 ENCOUNTER — Inpatient Hospital Stay: Payer: BC Managed Care – PPO

## 2020-07-28 ENCOUNTER — Inpatient Hospital Stay (HOSPITAL_BASED_OUTPATIENT_CLINIC_OR_DEPARTMENT_OTHER): Payer: BC Managed Care – PPO | Admitting: Oncology

## 2020-07-28 VITALS — BP 103/66 | HR 81 | Temp 97.5°F | Resp 18 | Ht 72.0 in | Wt 203.4 lb

## 2020-07-28 DIAGNOSIS — Z5111 Encounter for antineoplastic chemotherapy: Secondary | ICD-10-CM | POA: Insufficient documentation

## 2020-07-28 DIAGNOSIS — C61 Malignant neoplasm of prostate: Secondary | ICD-10-CM | POA: Insufficient documentation

## 2020-07-28 LAB — CMP (CANCER CENTER ONLY)
ALT: 34 U/L (ref 0–44)
AST: 32 U/L (ref 15–41)
Albumin: 3.9 g/dL (ref 3.5–5.0)
Alkaline Phosphatase: 50 U/L (ref 38–126)
Anion gap: 10 (ref 5–15)
BUN: 31 mg/dL — ABNORMAL HIGH (ref 6–20)
CO2: 22 mmol/L (ref 22–32)
Calcium: 9.9 mg/dL (ref 8.9–10.3)
Chloride: 105 mmol/L (ref 98–111)
Creatinine: 1.37 mg/dL — ABNORMAL HIGH (ref 0.61–1.24)
GFR, Est AFR Am: >60 mL/min (ref >60)
GFR, Estimated: 56 mL/min — ABNORMAL LOW (ref >60)
Glucose, Bld: 149 mg/dL — ABNORMAL HIGH (ref 70–99)
Potassium: 4.7 mmol/L (ref 3.5–5.1)
Sodium: 137 mmol/L (ref 135–145)
Total Bilirubin: 0.4 mg/dL (ref 0.3–1.2)
Total Protein: 7.2 g/dL (ref 6.5–8.1)

## 2020-07-28 LAB — CBC WITH DIFFERENTIAL (CANCER CENTER ONLY)
Abs Immature Granulocytes: 0.06 10*3/uL (ref 0.00–0.07)
Basophils Absolute: 0.1 10*3/uL (ref 0.0–0.1)
Basophils Relative: 1 %
Eosinophils Absolute: 0.3 10*3/uL (ref 0.0–0.5)
Eosinophils Relative: 3 %
HCT: 36.7 % — ABNORMAL LOW (ref 39.0–52.0)
Hemoglobin: 11.6 g/dL — ABNORMAL LOW (ref 13.0–17.0)
Immature Granulocytes: 1 %
Lymphocytes Relative: 25 %
Lymphs Abs: 2.4 10*3/uL (ref 0.7–4.0)
MCH: 27.2 pg (ref 26.0–34.0)
MCHC: 31.6 g/dL (ref 30.0–36.0)
MCV: 86.2 fL (ref 80.0–100.0)
Monocytes Absolute: 0.8 10*3/uL (ref 0.1–1.0)
Monocytes Relative: 9 %
Neutro Abs: 5.8 10*3/uL (ref 1.7–7.7)
Neutrophils Relative %: 61 %
Platelet Count: 220 10*3/uL (ref 150–400)
RBC: 4.26 MIL/uL (ref 4.22–5.81)
RDW: 14.9 % (ref 11.5–15.5)
WBC Count: 9.4 10*3/uL (ref 4.0–10.5)
nRBC: 0 % (ref 0.0–0.2)

## 2020-07-28 MED ORDER — LEUPROLIDE ACETATE (4 MONTH) 30 MG ~~LOC~~ KIT
30.0000 mg | PACK | Freq: Once | SUBCUTANEOUS | Status: AC
  Administered 2020-07-28: 30 mg via SUBCUTANEOUS

## 2020-07-28 MED ORDER — LEUPROLIDE ACETATE (4 MONTH) 30 MG ~~LOC~~ KIT
PACK | SUBCUTANEOUS | Status: AC
  Filled 2020-07-28: qty 30

## 2020-07-28 NOTE — Progress Notes (Signed)
Hematology and Oncology Follow Up Visit  Adam Cruz [id number] 1961-01-25 58 y.o. 07/28/2020 12:14 PM Adam Cruz, MDSaeed, Adam Leo, MD   Principle Diagnosis: 59 year old man with castration-sensitive prostate cancer diagnosed in 2018.  He was found to have PSA of 66 and a Gleason score of 8 and abdominal and pelvic adenopathy.  Prior Therapy:  Androgen deprivation therapy started in July 2018.  Zytiga 1000 mg daily started in August 2018.  His dose was reduced to 750 mg daily started in March 2019.  Therapy discontinued in March 2020.  Current therapy:  Eligard 30 mg every 4 months.  He will receive 1 today and repeated in 4 months.   Interim History: Adam Cruz is here for a follow-up visit.  Since the last visit, he reports feeling reasonably well without any recent complaints.  He denies any nausea, vomiting or abdominal pain.  He denies any excessive fatigue or tiredness.  He denies any bone pain or pathological fractures.  He denies any early satiety or hospitalizations.              Medications: Unchanged on review. Current Outpatient Medications  Medication Sig Dispense Refill  . amitriptyline (ELAVIL) 25 MG tablet Take 25 mg by mouth every evening.     . ASPIRIN 81 PO Take 81 mg by mouth at bedtime.   6  . Cholecalciferol (VITAMIN D3) 5000 units CAPS Take 1 capsule by mouth daily.     . fluticasone (FLONASE) 50 MCG/ACT nasal spray Place 1 spray into both nostrils daily.    . Gabapentin Enacarbil (HORIZANT) 600 MG TBCR Take 600 mg by mouth 2 (two) times daily.     . Insulin Glargine-Lixisenatide 100-33 UNT-MCG/ML SOPN Inject 65 Units into the skin daily.     Marland Kitchen levothyroxine (SYNTHROID, LEVOTHROID) 112 MCG tablet Take 112 mcg by mouth daily.    Marland Kitchen lisinopril (ZESTRIL) 5 MG tablet Take 5 mg by mouth daily.    Marland Kitchen NOVOLOG FLEXPEN 100 UNIT/ML FlexPen Inject 12 Units into the skin daily as needed.    Marland Kitchen omeprazole (PRILOSEC) 40 MG capsule Take 40 mg by mouth  daily.     . rosuvastatin (CRESTOR) 40 MG tablet     . SYNJARDY XR 12.04-999 MG TB24 Take by mouth.    . ticagrelor (BRILINTA) 90 MG TABS tablet Take 90 mg by mouth 2 (two) times daily.     . TRULICITY 3 OZ/3.6UY SOPN     . ZYTIGA 250 MG tablet TAKE 3 TABLETS BY MOUTH EVERY DAY ON AN EMPTY STOMACH 1 HOUR BEFORE OR 2 HOURS AFTER A MEAL (Patient not taking: Reported on 06/04/2019) 90 tablet 0   No current facility-administered medications for this visit.     Allergies: No Known Allergies    Physical Exam:   Blood pressure 103/66, pulse 81, temperature (!) 97.5 F (36.4 C), temperature source Tympanic, resp. rate 18, height 6' (1.829 m), weight 203 lb 6.4 oz (92.3 kg), SpO2 99 %.       ECOG: 1    General appearance: Comfortable appearing without any discomfort Head: Normocephalic without any trauma Oropharynx: Mucous membranes are moist and pink without any thrush or ulcers. Eyes: Pupils are equal and round reactive to light. Lymph nodes: No cervical, supraclavicular, inguinal or axillary lymphadenopathy.   Heart:regular rate and rhythm.  S1 and S2 without leg edema. Lung: Clear without any rhonchi or wheezes.  No dullness to percussion. Abdomin: Soft, nontender, nondistended with good bowel sounds.  No hepatosplenomegaly. Musculoskeletal:  No joint deformity or effusion.  Full range of motion noted. Neurological: No deficits noted on motor, sensory and deep tendon reflex exam. Skin: No petechial rash or dryness.  Appeared moist.    .       Lab Results: Lab Results  Component Value Date   WBC 9.4 07/28/2020   HGB 11.6 (L) 07/28/2020   HCT 36.7 (L) 07/28/2020   MCV 86.2 07/28/2020   PLT 220 07/28/2020     Chemistry      Component Value Date/Time   NA 137 07/28/2020 1131   NA 140 11/21/2017 1444   K 4.7 07/28/2020 1131   K 3.6 11/21/2017 1444   CL 105 07/28/2020 1131   CO2 22 07/28/2020 1131   CO2 25 11/21/2017 1444   BUN 31 (H) 07/28/2020 1131   BUN  14.2 11/21/2017 1444   CREATININE 1.37 (H) 07/28/2020 1131   CREATININE 1.1 11/21/2017 1444      Component Value Date/Time   CALCIUM 9.9 07/28/2020 1131   CALCIUM 9.4 11/21/2017 1444   ALKPHOS 50 07/28/2020 1131   ALKPHOS 119 11/21/2017 1444   AST 32 07/28/2020 1131   AST 29 11/21/2017 1444   ALT 34 07/28/2020 1131   ALT 32 11/21/2017 1444   BILITOT 0.4 07/28/2020 1131   BILITOT 0.40 11/21/2017 1444       Impression and Plan:  59 year old man with:   1.  Castration-sensitive prostate cancer with lymphadenopathy diagnosed in 2018.   The natural course of this disease was reviewed and the risk of progression into a castration-resistant disease were discussed.  Therapy escalation options were reviewed again and he has been on Zytiga and declined any additional therapy.  For the time being I recommended deferring any additional therapy unless he developed castration-resistant disease.  The plan is to obtain repeat imaging studies before the next visit.  2. Androgen deprivation therapy: He will receive Eligard today and repeated in 4 months.  Complications occluding weight gain, hot flashes and sexual dysfunction were reiterated.  3.  Hypertension: No issues reported with his blood pressure off Zytiga at this time.    4. Follow-up: In 4 months for repeat evaluation including repeat imaging studies.   30 minutes were spent on this encounter today.  The time was dedicated to updating his disease status, discussing treatment options and outlining future plan of care.  Zola Button, MD 8/10/202112:14 PM

## 2020-07-29 ENCOUNTER — Telehealth: Payer: Self-pay | Admitting: *Deleted

## 2020-07-29 LAB — PROSTATE-SPECIFIC AG, SERUM (LABCORP): Prostate Specific Ag, Serum: <0.1 ng/mL (ref 0.0–4.0)

## 2020-07-29 NOTE — Telephone Encounter (Signed)
-----   Message from Wyatt Portela, MD sent at 07/29/2020  8:47 AM EDT ----- Please let him know his PSA is still low

## 2020-07-29 NOTE — Telephone Encounter (Signed)
LM with note below 

## 2020-07-30 ENCOUNTER — Telehealth: Payer: Self-pay | Admitting: Oncology

## 2020-07-30 NOTE — Telephone Encounter (Signed)
Scheduled per 08/10 los, patient has been called and notified. 

## 2020-08-06 DIAGNOSIS — H4311 Vitreous hemorrhage, right eye: Secondary | ICD-10-CM | POA: Diagnosis not present

## 2020-08-27 DIAGNOSIS — E113511 Type 2 diabetes mellitus with proliferative diabetic retinopathy with macular edema, right eye: Secondary | ICD-10-CM | POA: Diagnosis not present

## 2020-10-23 DIAGNOSIS — Z1389 Encounter for screening for other disorder: Secondary | ICD-10-CM | POA: Diagnosis not present

## 2020-10-23 DIAGNOSIS — I259 Chronic ischemic heart disease, unspecified: Secondary | ICD-10-CM | POA: Diagnosis not present

## 2020-10-23 DIAGNOSIS — Z1331 Encounter for screening for depression: Secondary | ICD-10-CM | POA: Diagnosis not present

## 2020-10-23 DIAGNOSIS — Z6828 Body mass index (BMI) 28.0-28.9, adult: Secondary | ICD-10-CM | POA: Diagnosis not present

## 2020-10-23 DIAGNOSIS — Z23 Encounter for immunization: Secondary | ICD-10-CM | POA: Diagnosis not present

## 2020-10-23 DIAGNOSIS — Z0000 Encounter for general adult medical examination without abnormal findings: Secondary | ICD-10-CM | POA: Diagnosis not present

## 2020-10-23 DIAGNOSIS — I10 Essential (primary) hypertension: Secondary | ICD-10-CM | POA: Diagnosis not present

## 2020-10-26 DIAGNOSIS — R0602 Shortness of breath: Secondary | ICD-10-CM | POA: Diagnosis not present

## 2020-10-26 DIAGNOSIS — J988 Other specified respiratory disorders: Secondary | ICD-10-CM | POA: Diagnosis not present

## 2020-10-26 DIAGNOSIS — U071 COVID-19: Secondary | ICD-10-CM | POA: Diagnosis not present

## 2020-10-28 DIAGNOSIS — U071 COVID-19: Secondary | ICD-10-CM | POA: Diagnosis not present

## 2020-11-05 DIAGNOSIS — E113511 Type 2 diabetes mellitus with proliferative diabetic retinopathy with macular edema, right eye: Secondary | ICD-10-CM | POA: Diagnosis not present

## 2020-11-20 ENCOUNTER — Other Ambulatory Visit: Payer: Self-pay

## 2020-11-20 ENCOUNTER — Ambulatory Visit (HOSPITAL_COMMUNITY)
Admission: RE | Admit: 2020-11-20 | Discharge: 2020-11-20 | Disposition: A | Discharge status: Home / Self Care | Payer: BC Managed Care – PPO | Source: Ambulatory Visit | Attending: Oncology | Admitting: Oncology

## 2020-11-20 ENCOUNTER — Ambulatory Visit (HOSPITAL_COMMUNITY): Payer: BC Managed Care – PPO

## 2020-11-20 ENCOUNTER — Inpatient Hospital Stay: Payer: BC Managed Care – PPO | Attending: Oncology

## 2020-11-20 DIAGNOSIS — I700 Atherosclerosis of aorta: Secondary | ICD-10-CM | POA: Diagnosis not present

## 2020-11-20 DIAGNOSIS — C61 Malignant neoplasm of prostate: Secondary | ICD-10-CM

## 2020-11-20 DIAGNOSIS — I2510 Atherosclerotic heart disease of native coronary artery without angina pectoris: Secondary | ICD-10-CM | POA: Diagnosis not present

## 2020-11-20 DIAGNOSIS — Q625 Duplication of ureter: Secondary | ICD-10-CM | POA: Diagnosis not present

## 2020-11-20 DIAGNOSIS — Z5111 Encounter for antineoplastic chemotherapy: Secondary | ICD-10-CM | POA: Insufficient documentation

## 2020-11-20 DIAGNOSIS — M5137 Other intervertebral disc degeneration, lumbosacral region: Secondary | ICD-10-CM | POA: Diagnosis not present

## 2020-11-20 DIAGNOSIS — Q630 Accessory kidney: Secondary | ICD-10-CM | POA: Diagnosis not present

## 2020-11-20 LAB — CBC WITH DIFFERENTIAL (CANCER CENTER ONLY)
Abs Immature Granulocytes: 0.03 10*3/uL (ref 0.00–0.07)
Basophils Absolute: 0.1 10*3/uL (ref 0.0–0.1)
Basophils Relative: 1 %
Eosinophils Absolute: 0.3 10*3/uL (ref 0.0–0.5)
Eosinophils Relative: 3 %
HCT: 37.7 % — ABNORMAL LOW (ref 39.0–52.0)
Hemoglobin: 11.8 g/dL — ABNORMAL LOW (ref 13.0–17.0)
Immature Granulocytes: 0 %
Lymphocytes Relative: 23 %
Lymphs Abs: 2.2 10*3/uL (ref 0.7–4.0)
MCH: 27.1 pg (ref 26.0–34.0)
MCHC: 31.3 g/dL (ref 30.0–36.0)
MCV: 86.5 fL (ref 80.0–100.0)
Monocytes Absolute: 0.9 10*3/uL (ref 0.1–1.0)
Monocytes Relative: 9 %
Neutro Abs: 6.2 10*3/uL (ref 1.7–7.7)
Neutrophils Relative %: 64 %
Platelet Count: 215 10*3/uL (ref 150–400)
RBC: 4.36 MIL/uL (ref 4.22–5.81)
RDW: 14.1 % (ref 11.5–15.5)
WBC Count: 9.7 10*3/uL (ref 4.0–10.5)
nRBC: 0 % (ref 0.0–0.2)

## 2020-11-20 LAB — CMP (CANCER CENTER ONLY)
ALT: 13 U/L (ref 0–44)
AST: 17 U/L (ref 15–41)
Albumin: 4 g/dL (ref 3.5–5.0)
Alkaline Phosphatase: 47 U/L (ref 38–126)
Anion gap: 9 (ref 5–15)
BUN: 27 mg/dL — ABNORMAL HIGH (ref 6–20)
CO2: 24 mmol/L (ref 22–32)
Calcium: 10.7 mg/dL — ABNORMAL HIGH (ref 8.9–10.3)
Chloride: 106 mmol/L (ref 98–111)
Creatinine: 1.28 mg/dL — ABNORMAL HIGH (ref 0.61–1.24)
GFR, Estimated: >60 mL/min (ref >60)
Glucose, Bld: 123 mg/dL — ABNORMAL HIGH (ref 70–99)
Potassium: 4.1 mmol/L (ref 3.5–5.1)
Sodium: 139 mmol/L (ref 135–145)
Total Bilirubin: 0.4 mg/dL (ref 0.3–1.2)
Total Protein: 7.5 g/dL (ref 6.5–8.1)

## 2020-11-20 MED ORDER — IOHEXOL 300 MG/ML  SOLN
100.0000 mL | Freq: Once | INTRAMUSCULAR | Status: AC | PRN
  Administered 2020-11-20: 100 mL via INTRAVENOUS

## 2020-11-21 LAB — PROSTATE-SPECIFIC AG, SERUM (LABCORP): Prostate Specific Ag, Serum: <0.1 ng/mL (ref 0.0–4.0)

## 2020-11-24 ENCOUNTER — Inpatient Hospital Stay: Payer: BC Managed Care – PPO

## 2020-11-24 ENCOUNTER — Other Ambulatory Visit: Payer: Self-pay

## 2020-11-24 ENCOUNTER — Inpatient Hospital Stay (HOSPITAL_BASED_OUTPATIENT_CLINIC_OR_DEPARTMENT_OTHER): Payer: BC Managed Care – PPO | Admitting: Oncology

## 2020-11-24 VITALS — BP 127/77 | HR 80 | Temp 98.2°F | Resp 20 | Ht 72.0 in | Wt 205.9 lb

## 2020-11-24 DIAGNOSIS — C61 Malignant neoplasm of prostate: Secondary | ICD-10-CM

## 2020-11-24 DIAGNOSIS — Z5111 Encounter for antineoplastic chemotherapy: Secondary | ICD-10-CM | POA: Diagnosis not present

## 2020-11-24 MED ORDER — LEUPROLIDE ACETATE (4 MONTH) 30 MG ~~LOC~~ KIT
PACK | SUBCUTANEOUS | Status: AC
  Filled 2020-11-24: qty 30

## 2020-11-24 MED ORDER — LEUPROLIDE ACETATE (4 MONTH) 30 MG ~~LOC~~ KIT
30.0000 mg | PACK | Freq: Once | SUBCUTANEOUS | Status: AC
  Administered 2020-11-24: 30 mg via SUBCUTANEOUS

## 2020-11-24 NOTE — Progress Notes (Signed)
Hematology and Oncology Follow Up Visit  Adam Cruz [id number] May 11, 1961 59 y.o. 11/24/2020 9:02 AM Adam Cruz, Adam Cruz, Adam Leo, MD   Principle Diagnosis: 59 year old man with with advanced prostate cancer presented with pelvic and abdominal adenopathy 2018.  He has castration-sensitive disease after presenting with PSA of 66 and a Gleason score of 8.  Prior Therapy:  Androgen deprivation therapy started in July 2018.  Zytiga 1000 mg daily started in August 2018.  His dose was reduced to 750 mg daily started in March 2019.  Therapy discontinued in March 2020.  Current therapy:  Eligard 30 mg every 4 months.  His last injection was given on August 10 and will be repeated today.   Interim History: Adam Cruz is here for return evaluation.  Since last visit, he reports no major changes in his health.  He denies any recent hospitalization or illnesses.  He denies any worsening bone pain or pathological fractures.  He did report some occasional left-sided groin discomfort that is manageable.  He continues to be ambulatory and attends to activities of daily living.              Medications: Reviewed without changes. Current Outpatient Medications  Medication Sig Dispense Refill  . amitriptyline (ELAVIL) 25 MG tablet Take 25 mg by mouth every evening.     . ASPIRIN 81 PO Take 81 mg by mouth at bedtime.   6  . Cholecalciferol (VITAMIN D3) 5000 units CAPS Take 1 capsule by mouth daily.     . fluticasone (FLONASE) 50 MCG/ACT nasal spray Place 1 spray into both nostrils daily.    . Gabapentin Enacarbil (HORIZANT) 600 MG TBCR Take 600 mg by mouth 2 (two) times daily.     . Insulin Glargine-Lixisenatide 100-33 UNT-MCG/ML SOPN Inject 65 Units into the skin daily.     Marland Kitchen levothyroxine (SYNTHROID, LEVOTHROID) 112 MCG tablet Take 112 mcg by mouth daily.    Marland Kitchen lisinopril (ZESTRIL) 5 MG tablet Take 5 mg by mouth daily.    Marland Kitchen NOVOLOG FLEXPEN 100 UNIT/ML FlexPen Inject 12 Units into the  skin daily as needed.    Marland Kitchen omeprazole (PRILOSEC) 40 MG capsule Take 40 mg by mouth daily.     . rosuvastatin (CRESTOR) 40 MG tablet     . SYNJARDY XR 12.04-999 MG TB24 Take by mouth.    . ticagrelor (BRILINTA) 90 MG TABS tablet Take 90 mg by mouth 2 (two) times daily.     . TRULICITY 3 YQ/6.5HQ SOPN     . ZYTIGA 250 MG tablet TAKE 3 TABLETS BY MOUTH EVERY DAY ON AN EMPTY STOMACH 1 HOUR BEFORE OR 2 HOURS AFTER A MEAL (Patient not taking: Reported on 06/04/2019) 90 tablet 0   No current facility-administered medications for this visit.     Allergies: No Known Allergies    Physical Exam:      Blood pressure 127/77, pulse 80, temperature 98.2 F (36.8 C), temperature source Tympanic, resp. rate 20, height 6' (1.829 m), weight 205 lb 14.4 oz (93.4 kg), SpO2 100 %.    ECOG: 1   General appearance: Alert, awake without any distress. Head: Atraumatic without abnormalities Oropharynx: Without any thrush or ulcers. Eyes: No scleral icterus. Lymph nodes: No lymphadenopathy noted in the cervical, supraclavicular, or axillary nodes Heart:regular rate and rhythm, without any murmurs or gallops.   Lung: Clear to auscultation without any rhonchi, wheezes or dullness to percussion. Abdomin: Soft, nontender without any shifting dullness or ascites. Musculoskeletal: No clubbing or cyanosis.  Neurological: No motor or sensory deficits. Skin: No rashes or lesions.          Lab Results: Lab Results  Component Value Date   WBC 9.7 11/20/2020   HGB 11.8 (L) 11/20/2020   HCT 37.7 (L) 11/20/2020   MCV 86.5 11/20/2020   PLT 215 11/20/2020     Chemistry      Component Value Date/Time   NA 139 11/20/2020 1023   NA 140 11/21/2017 1444   K 4.1 11/20/2020 1023   K 3.6 11/21/2017 1444   CL 106 11/20/2020 1023   CO2 24 11/20/2020 1023   CO2 25 11/21/2017 1444   BUN 27 (H) 11/20/2020 1023   BUN 14.2 11/21/2017 1444   CREATININE 1.28 (H) 11/20/2020 1023   CREATININE 1.1 11/21/2017  1444      Component Value Date/Time   CALCIUM 10.7 (H) 11/20/2020 1023   CALCIUM 9.4 11/21/2017 1444   ALKPHOS 47 11/20/2020 1023   ALKPHOS 119 11/21/2017 1444   AST 17 11/20/2020 1023   AST 29 11/21/2017 1444   ALT 13 11/20/2020 1023   ALT 32 11/21/2017 1444   BILITOT 0.4 11/20/2020 1023   BILITOT 0.40 11/21/2017 1444      Results for Adam Cruz (MRN [id number]) as of 11/24/2020 09:05  Ref. Range 07/28/2020 11:31 11/20/2020 10:23  Prostate Specific Ag, Serum Latest Ref Range: 0.0 - 4.0 ng/mL <0.1 <0.1    IMPRESSION: 1. Stable exam. No new or progressive findings to suggest metastatic disease in the chest, abdomen, or pelvis. 2. Stable bilateral pulmonary nodules. 3. Prominent stool volume is compatible with but not diagnostic of constipation. 4. Aortic Atherosclerosis (ICD10-I70.0). Impression and Plan:  59 year old man with:   1.  Advanced prostate cancer with lymphadenopathy diagnosed in 2018.  He has castration-sensitive disease.   He is currently on androgen deprivation therapy alone after period of combination with Zytiga.  The natural course of his disease was updated and his laboratory data and imaging studies were reviewed personally today.  His PSA continues to be undetectable with imaging studies showed no evidence of malignancy.  Risks and benefits of continuing this approach versus therapy escalation were reiterated.  Using different agents such as Xtandi and systemic chemotherapy were discussed again and we opted to use dose unless he developed castration-resistant disease.  He is agreeable with this plan.   2. Androgen deprivation therapy: He is currently on Eligard which will be continued every 4 months.  Complications including weight gain, hot flashes among others were reviewed.  3.  Hypertension: His blood pressure is back to normal range.  4. Follow-up: Follow-up in 4 months for a repeat evaluation and Eligard.   30 minutes were dedicated to  this visit.  The time was spent on reviewing laboratory data, reviewing imaging studies and addressing complications related to his cancer and cancer therapy.  Adam Button, MD 12/7/20219:02 AM

## 2020-11-24 NOTE — Patient Instructions (Signed)

## 2020-12-04 ENCOUNTER — Ambulatory Visit: Payer: BC Managed Care – PPO | Admitting: Oncology

## 2020-12-04 ENCOUNTER — Ambulatory Visit: Payer: BC Managed Care – PPO

## 2021-01-25 DIAGNOSIS — E039 Hypothyroidism, unspecified: Secondary | ICD-10-CM | POA: Diagnosis not present

## 2021-01-25 DIAGNOSIS — E782 Mixed hyperlipidemia: Secondary | ICD-10-CM | POA: Diagnosis not present

## 2021-01-25 DIAGNOSIS — E113391 Type 2 diabetes mellitus with moderate nonproliferative diabetic retinopathy without macular edema, right eye: Secondary | ICD-10-CM | POA: Diagnosis not present

## 2021-01-25 DIAGNOSIS — Z794 Long term (current) use of insulin: Secondary | ICD-10-CM | POA: Diagnosis not present

## 2021-01-25 DIAGNOSIS — I10 Essential (primary) hypertension: Secondary | ICD-10-CM | POA: Diagnosis not present

## 2021-02-24 DIAGNOSIS — J0110 Acute frontal sinusitis, unspecified: Secondary | ICD-10-CM | POA: Diagnosis not present

## 2021-03-23 ENCOUNTER — Inpatient Hospital Stay: Payer: BC Managed Care – PPO | Attending: Oncology

## 2021-03-23 ENCOUNTER — Inpatient Hospital Stay: Payer: BC Managed Care – PPO

## 2021-03-23 ENCOUNTER — Inpatient Hospital Stay (HOSPITAL_BASED_OUTPATIENT_CLINIC_OR_DEPARTMENT_OTHER): Payer: BC Managed Care – PPO | Admitting: Oncology

## 2021-03-23 ENCOUNTER — Other Ambulatory Visit: Payer: Self-pay

## 2021-03-23 VITALS — BP 140/78 | HR 77 | Temp 96.2°F | Resp 17 | Wt 204.7 lb

## 2021-03-23 DIAGNOSIS — Z5111 Encounter for antineoplastic chemotherapy: Secondary | ICD-10-CM | POA: Insufficient documentation

## 2021-03-23 DIAGNOSIS — C61 Malignant neoplasm of prostate: Secondary | ICD-10-CM

## 2021-03-23 LAB — CMP (CANCER CENTER ONLY)
ALT: 22 U/L (ref 0–44)
AST: 23 U/L (ref 15–41)
Albumin: 3.9 g/dL (ref 3.5–5.0)
Alkaline Phosphatase: 46 U/L (ref 38–126)
Anion gap: 13 (ref 5–15)
BUN: 13 mg/dL (ref 6–20)
CO2: 21 mmol/L — ABNORMAL LOW (ref 22–32)
Calcium: 10.2 mg/dL (ref 8.9–10.3)
Chloride: 107 mmol/L (ref 98–111)
Creatinine: 1.04 mg/dL (ref 0.61–1.24)
GFR, Estimated: >60 mL/min (ref >60)
Glucose, Bld: 154 mg/dL — ABNORMAL HIGH (ref 70–99)
Potassium: 4.9 mmol/L (ref 3.5–5.1)
Sodium: 141 mmol/L (ref 135–145)
Total Bilirubin: 0.4 mg/dL (ref 0.3–1.2)
Total Protein: 7.6 g/dL (ref 6.5–8.1)

## 2021-03-23 LAB — CBC WITH DIFFERENTIAL (CANCER CENTER ONLY)
Abs Immature Granulocytes: 0.02 10*3/uL (ref 0.00–0.07)
Basophils Absolute: 0.1 10*3/uL (ref 0.0–0.1)
Basophils Relative: 1 %
Eosinophils Absolute: 0.4 10*3/uL (ref 0.0–0.5)
Eosinophils Relative: 4 %
HCT: 38.3 % — ABNORMAL LOW (ref 39.0–52.0)
Hemoglobin: 12 g/dL — ABNORMAL LOW (ref 13.0–17.0)
Immature Granulocytes: 0 %
Lymphocytes Relative: 29 %
Lymphs Abs: 2.8 10*3/uL (ref 0.7–4.0)
MCH: 28.1 pg (ref 26.0–34.0)
MCHC: 31.3 g/dL (ref 30.0–36.0)
MCV: 89.7 fL (ref 80.0–100.0)
Monocytes Absolute: 1 10*3/uL (ref 0.1–1.0)
Monocytes Relative: 10 %
Neutro Abs: 5.4 10*3/uL (ref 1.7–7.7)
Neutrophils Relative %: 56 %
Platelet Count: 243 10*3/uL (ref 150–400)
RBC: 4.27 MIL/uL (ref 4.22–5.81)
RDW: 14.1 % (ref 11.5–15.5)
WBC Count: 9.6 10*3/uL (ref 4.0–10.5)
nRBC: 0 % (ref 0.0–0.2)

## 2021-03-23 MED ORDER — LEUPROLIDE ACETATE (4 MONTH) 30 MG ~~LOC~~ KIT
PACK | SUBCUTANEOUS | Status: AC
  Filled 2021-03-23: qty 30

## 2021-03-23 MED ORDER — LEUPROLIDE ACETATE (4 MONTH) 30 MG ~~LOC~~ KIT
30.0000 mg | PACK | Freq: Once | SUBCUTANEOUS | Status: AC
  Administered 2021-03-23: 30 mg via SUBCUTANEOUS

## 2021-03-23 NOTE — Patient Instructions (Signed)

## 2021-03-23 NOTE — Progress Notes (Signed)
Hematology and Oncology Follow Up Visit  Adam Cruz [id number] [birth year] 60 y.o. 03/23/2021 9:22 AM Adam Cruz, Adam Cruz, Adam Lard, MD   Principle Diagnosis: 60 year old man with castration-sensitive advanced prostate cancer diagnosed in 2018.  He was found to PSA of 66 and a Gleason score of 8 at the time of diagnosis.  Prior Therapy:  Androgen deprivation therapy started in July 2018.  Zytiga 1000 mg daily started in August 2018.  His dose was reduced to 750 mg daily started in March 2019.  Therapy discontinued in March 2020.  Current therapy:  Eligard 30 mg every 4 months.  He will receive Eligard today and repeated in 4 months.   Interim History: Adam Cruz presents today for a follow-up visit.  Since her last visit, he reports no major changes in his health.  He continues to tolerate Eligard without any complaints.  He denies any nausea, vomiting or abdominal pain.  He denies any recent hospitalization or illnesses.  Continues to have worsening neuropathy in his upper and lower extremity related to diabetes.  Denies any bone pain or pathological fractures.  Denies any recent hospitalization or illnesses.              Medications: Updated on review. Current Outpatient Medications  Medication Sig Dispense Refill  . amitriptyline (ELAVIL) 25 MG tablet Take 25 mg by mouth every evening.     . ASPIRIN 81 PO Take 81 mg by mouth at bedtime.   6  . Cholecalciferol (VITAMIN D3) 5000 units CAPS Take 1 capsule by mouth daily.     . fluticasone (FLONASE) 50 MCG/ACT nasal spray Place 1 spray into both nostrils daily.    . Gabapentin Enacarbil 600 MG TBCR Take 600 mg by mouth 2 (two) times daily.     . Insulin Glargine-Lixisenatide 100-33 UNT-MCG/ML SOPN Inject 65 Units into the skin daily.     Marland Kitchen levothyroxine (SYNTHROID, LEVOTHROID) 112 MCG tablet Take 112 mcg by mouth daily.    Marland Kitchen NOVOLOG FLEXPEN 100 UNIT/ML FlexPen Inject 12 Units into the skin daily as needed.    Marland Kitchen omeprazole  (PRILOSEC) 40 MG capsule Take 40 mg by mouth daily.     . rosuvastatin (CRESTOR) 40 MG tablet     . SYNJARDY XR 12.04-999 MG TB24 Take by mouth.    . ticagrelor (BRILINTA) 90 MG TABS tablet Take 90 mg by mouth 2 (two) times daily.     . TRULICITY 3 RA/0.7MA SOPN     . lisinopril (ZESTRIL) 5 MG tablet Take 5 mg by mouth daily. (Patient not taking: Reported on 03/23/2021)    . ZYTIGA 250 MG tablet TAKE 3 TABLETS BY MOUTH EVERY DAY ON AN EMPTY STOMACH 1 HOUR BEFORE OR 2 HOURS AFTER A MEAL (Patient not taking: No sig reported) 90 tablet 0   No current facility-administered medications for this visit.     Allergies: No Known Allergies    Physical Exam:      Blood pressure 140/78, pulse 77, temperature (!) 96.2 F (35.7 C), temperature source Tympanic, resp. rate 17, weight 204 lb 11.2 oz (92.9 kg), SpO2 100 %.    ECOG: 1    General appearance: Comfortable appearing without any discomfort Head: Normocephalic without any trauma Oropharynx: Mucous membranes are moist and pink without any thrush or ulcers. Eyes: Pupils are equal and round reactive to light. Lymph nodes: No cervical, supraclavicular, inguinal or axillary lymphadenopathy.   Heart:regular rate and rhythm.  S1 and S2 without leg edema. Lung: Clear  without any rhonchi or wheezes.  No dullness to percussion. Abdomin: Soft, nontender, nondistended with good bowel sounds.  No hepatosplenomegaly. Musculoskeletal: No joint deformity or effusion.  Full range of motion noted. Neurological: No deficits noted on motor, sensory and deep tendon reflex exam. Skin: No petechial rash or dryness.  Appeared moist.            Lab Results: Lab Results  Component Value Date   WBC 9.6 03/23/2021   HGB 12.0 (L) 03/23/2021   HCT 38.3 (L) 03/23/2021   MCV 89.7 03/23/2021   PLT 243 03/23/2021     Chemistry      Component Value Date/Time   NA 139 11/20/2020 1023   NA 140 11/21/2017 1444   K 4.1 11/20/2020 1023   K 3.6  11/21/2017 1444   CL 106 11/20/2020 1023   CO2 24 11/20/2020 1023   CO2 25 11/21/2017 1444   BUN 27 (H) 11/20/2020 1023   BUN 14.2 11/21/2017 1444   CREATININE 1.28 (H) 11/20/2020 1023   CREATININE 1.1 11/21/2017 1444      Component Value Date/Time   CALCIUM 10.7 (H) 11/20/2020 1023   CALCIUM 9.4 11/21/2017 1444   ALKPHOS 47 11/20/2020 1023   ALKPHOS 119 11/21/2017 1444   AST 17 11/20/2020 1023   AST 29 11/21/2017 1444   ALT 13 11/20/2020 1023   ALT 32 11/21/2017 1444   BILITOT 0.4 11/20/2020 1023   BILITOT 0.40 11/21/2017 1444      Results for Adam Cruz (MRN [id number]) as of 03/23/2021 09:25  Ref. Range 11/20/2020 10:23  Prostate Specific Ag, Serum Latest Ref Range: 0.0 - 4.0 ng/mL <50.63    60 year old man with:   1.  Castration-sensitive advanced prostate cancer with lymphadenopathy diagnosed in 2018.     The natural course of his disease was reviewed today and treatment options were discussed at this time.  Therapy escalation.  His castration-sensitive disease were reviewed.  He received close to 2 years of Zytiga and opted against additional therapy given his other health issues and side effects related to treatment.  Treatment options such as Xtandi, PARP inhibitor, chemotherapy among other options will be implemented if he developed castration-resistant disease.  He received staging scans in December 2021 will be repeated in December 2022 unless his PSA starts to rise.  2. Androgen deprivation therapy: Complications including weight gain, hot flashes among others were reiterated.  He is agreeable to proceed today and repeated in 4 months.  3.  Hypertension: Overall manageable and within normal range between visits.  4.  Neuropathy: Unrelated to his cancer and cancer therapy and more related to his diabetes.  This will be a limiting factor in future if taxanes are considered.  5. Follow-up: Return in 4 months for repeat follow-up with Eligard.   30 minutes  were spent on this encounter.  Time was dedicated to reviewing laboratory data, disease status update and outlining future plan of care.  Adam Button, MD 4/5/20229:22 AM

## 2021-03-24 ENCOUNTER — Telehealth: Payer: Self-pay | Admitting: *Deleted

## 2021-03-24 LAB — PROSTATE-SPECIFIC AG, SERUM (LABCORP): Prostate Specific Ag, Serum: <0.1 ng/mL (ref 0.0–4.0)

## 2021-03-24 NOTE — Telephone Encounter (Signed)
Patient called office, informed him PSA is 0.1, patient verbalized understanding.

## 2021-03-24 NOTE — Telephone Encounter (Signed)
Phone call to patient regarding lab results, no answer, left message for patient to call office.

## 2021-03-24 NOTE — Telephone Encounter (Signed)
-----   Message from Wyatt Portela, MD sent at 03/24/2021  8:11 AM EDT ----- Please let him know his PSA is still low

## 2021-03-25 DIAGNOSIS — E113511 Type 2 diabetes mellitus with proliferative diabetic retinopathy with macular edema, right eye: Secondary | ICD-10-CM | POA: Diagnosis not present

## 2021-04-22 DIAGNOSIS — H4311 Vitreous hemorrhage, right eye: Secondary | ICD-10-CM | POA: Diagnosis not present

## 2021-04-26 DIAGNOSIS — I10 Essential (primary) hypertension: Secondary | ICD-10-CM | POA: Diagnosis not present

## 2021-04-26 DIAGNOSIS — E113391 Type 2 diabetes mellitus with moderate nonproliferative diabetic retinopathy without macular edema, right eye: Secondary | ICD-10-CM | POA: Diagnosis not present

## 2021-04-26 DIAGNOSIS — E782 Mixed hyperlipidemia: Secondary | ICD-10-CM | POA: Diagnosis not present

## 2021-04-26 DIAGNOSIS — E039 Hypothyroidism, unspecified: Secondary | ICD-10-CM | POA: Diagnosis not present

## 2021-05-03 DIAGNOSIS — M6281 Muscle weakness (generalized): Secondary | ICD-10-CM | POA: Diagnosis not present

## 2021-05-03 DIAGNOSIS — M545 Low back pain, unspecified: Secondary | ICD-10-CM | POA: Diagnosis not present

## 2021-05-03 DIAGNOSIS — R2689 Other abnormalities of gait and mobility: Secondary | ICD-10-CM | POA: Diagnosis not present

## 2021-05-06 DIAGNOSIS — M6281 Muscle weakness (generalized): Secondary | ICD-10-CM | POA: Diagnosis not present

## 2021-05-06 DIAGNOSIS — R2689 Other abnormalities of gait and mobility: Secondary | ICD-10-CM | POA: Diagnosis not present

## 2021-05-06 DIAGNOSIS — M545 Low back pain, unspecified: Secondary | ICD-10-CM | POA: Diagnosis not present

## 2021-05-10 DIAGNOSIS — R2689 Other abnormalities of gait and mobility: Secondary | ICD-10-CM | POA: Diagnosis not present

## 2021-05-10 DIAGNOSIS — M545 Low back pain, unspecified: Secondary | ICD-10-CM | POA: Diagnosis not present

## 2021-05-10 DIAGNOSIS — M6281 Muscle weakness (generalized): Secondary | ICD-10-CM | POA: Diagnosis not present

## 2021-05-12 DIAGNOSIS — R2689 Other abnormalities of gait and mobility: Secondary | ICD-10-CM | POA: Diagnosis not present

## 2021-05-12 DIAGNOSIS — M6281 Muscle weakness (generalized): Secondary | ICD-10-CM | POA: Diagnosis not present

## 2021-05-12 DIAGNOSIS — M545 Low back pain, unspecified: Secondary | ICD-10-CM | POA: Diagnosis not present

## 2021-05-19 DIAGNOSIS — M545 Low back pain, unspecified: Secondary | ICD-10-CM | POA: Diagnosis not present

## 2021-05-19 DIAGNOSIS — M6281 Muscle weakness (generalized): Secondary | ICD-10-CM | POA: Diagnosis not present

## 2021-05-19 DIAGNOSIS — R2689 Other abnormalities of gait and mobility: Secondary | ICD-10-CM | POA: Diagnosis not present

## 2021-05-20 DIAGNOSIS — H4311 Vitreous hemorrhage, right eye: Secondary | ICD-10-CM | POA: Diagnosis not present

## 2021-05-21 DIAGNOSIS — R2689 Other abnormalities of gait and mobility: Secondary | ICD-10-CM | POA: Diagnosis not present

## 2021-05-21 DIAGNOSIS — M6281 Muscle weakness (generalized): Secondary | ICD-10-CM | POA: Diagnosis not present

## 2021-05-21 DIAGNOSIS — M545 Low back pain, unspecified: Secondary | ICD-10-CM | POA: Diagnosis not present

## 2021-05-24 DIAGNOSIS — R2689 Other abnormalities of gait and mobility: Secondary | ICD-10-CM | POA: Diagnosis not present

## 2021-05-24 DIAGNOSIS — M545 Low back pain, unspecified: Secondary | ICD-10-CM | POA: Diagnosis not present

## 2021-05-24 DIAGNOSIS — M6281 Muscle weakness (generalized): Secondary | ICD-10-CM | POA: Diagnosis not present

## 2021-05-26 DIAGNOSIS — E113511 Type 2 diabetes mellitus with proliferative diabetic retinopathy with macular edema, right eye: Secondary | ICD-10-CM | POA: Diagnosis not present

## 2021-05-31 DIAGNOSIS — M6281 Muscle weakness (generalized): Secondary | ICD-10-CM | POA: Diagnosis not present

## 2021-05-31 DIAGNOSIS — R2689 Other abnormalities of gait and mobility: Secondary | ICD-10-CM | POA: Diagnosis not present

## 2021-05-31 DIAGNOSIS — M545 Low back pain, unspecified: Secondary | ICD-10-CM | POA: Diagnosis not present

## 2021-06-04 DIAGNOSIS — M6281 Muscle weakness (generalized): Secondary | ICD-10-CM | POA: Diagnosis not present

## 2021-06-04 DIAGNOSIS — M545 Low back pain, unspecified: Secondary | ICD-10-CM | POA: Diagnosis not present

## 2021-06-04 DIAGNOSIS — R2689 Other abnormalities of gait and mobility: Secondary | ICD-10-CM | POA: Diagnosis not present

## 2021-06-14 DIAGNOSIS — M6281 Muscle weakness (generalized): Secondary | ICD-10-CM | POA: Diagnosis not present

## 2021-06-14 DIAGNOSIS — M545 Low back pain, unspecified: Secondary | ICD-10-CM | POA: Diagnosis not present

## 2021-06-14 DIAGNOSIS — R2689 Other abnormalities of gait and mobility: Secondary | ICD-10-CM | POA: Diagnosis not present

## 2021-06-16 DIAGNOSIS — M6281 Muscle weakness (generalized): Secondary | ICD-10-CM | POA: Diagnosis not present

## 2021-06-16 DIAGNOSIS — M545 Low back pain, unspecified: Secondary | ICD-10-CM | POA: Diagnosis not present

## 2021-06-16 DIAGNOSIS — R2689 Other abnormalities of gait and mobility: Secondary | ICD-10-CM | POA: Diagnosis not present

## 2021-06-17 DIAGNOSIS — R2689 Other abnormalities of gait and mobility: Secondary | ICD-10-CM | POA: Diagnosis not present

## 2021-06-17 DIAGNOSIS — M6281 Muscle weakness (generalized): Secondary | ICD-10-CM | POA: Diagnosis not present

## 2021-06-17 DIAGNOSIS — M545 Low back pain, unspecified: Secondary | ICD-10-CM | POA: Diagnosis not present

## 2021-06-22 DIAGNOSIS — J069 Acute upper respiratory infection, unspecified: Secondary | ICD-10-CM | POA: Diagnosis not present

## 2021-07-28 ENCOUNTER — Inpatient Hospital Stay: Payer: BC Managed Care – PPO | Attending: Oncology

## 2021-07-28 ENCOUNTER — Inpatient Hospital Stay: Payer: BC Managed Care – PPO

## 2021-07-28 ENCOUNTER — Other Ambulatory Visit: Payer: Self-pay

## 2021-07-28 ENCOUNTER — Inpatient Hospital Stay (HOSPITAL_BASED_OUTPATIENT_CLINIC_OR_DEPARTMENT_OTHER): Payer: BC Managed Care – PPO | Admitting: Oncology

## 2021-07-28 VITALS — BP 119/72 | HR 83 | Temp 96.7°F | Resp 18 | Wt 198.5 lb

## 2021-07-28 DIAGNOSIS — E039 Hypothyroidism, unspecified: Secondary | ICD-10-CM | POA: Diagnosis not present

## 2021-07-28 DIAGNOSIS — C61 Malignant neoplasm of prostate: Secondary | ICD-10-CM

## 2021-07-28 DIAGNOSIS — E119 Type 2 diabetes mellitus without complications: Secondary | ICD-10-CM | POA: Diagnosis not present

## 2021-07-28 DIAGNOSIS — Z5111 Encounter for antineoplastic chemotherapy: Secondary | ICD-10-CM | POA: Diagnosis not present

## 2021-07-28 DIAGNOSIS — E782 Mixed hyperlipidemia: Secondary | ICD-10-CM | POA: Diagnosis not present

## 2021-07-28 DIAGNOSIS — I10 Essential (primary) hypertension: Secondary | ICD-10-CM | POA: Diagnosis not present

## 2021-07-28 DIAGNOSIS — E113511 Type 2 diabetes mellitus with proliferative diabetic retinopathy with macular edema, right eye: Secondary | ICD-10-CM | POA: Diagnosis not present

## 2021-07-28 LAB — CMP (CANCER CENTER ONLY)
ALT: 14 U/L (ref 0–44)
AST: 14 U/L — ABNORMAL LOW (ref 15–41)
Albumin: 3.8 g/dL (ref 3.5–5.0)
Alkaline Phosphatase: 73 U/L (ref 38–126)
Anion gap: 11 (ref 5–15)
BUN: 24 mg/dL — ABNORMAL HIGH (ref 6–20)
CO2: 21 mmol/L — ABNORMAL LOW (ref 22–32)
Calcium: 10.3 mg/dL (ref 8.9–10.3)
Chloride: 108 mmol/L (ref 98–111)
Creatinine: 1.14 mg/dL (ref 0.61–1.24)
GFR, Estimated: >60 mL/min (ref >60)
Glucose, Bld: 183 mg/dL — ABNORMAL HIGH (ref 70–99)
Potassium: 4.4 mmol/L (ref 3.5–5.1)
Sodium: 140 mmol/L (ref 135–145)
Total Bilirubin: 0.3 mg/dL (ref 0.3–1.2)
Total Protein: 7.3 g/dL (ref 6.5–8.1)

## 2021-07-28 LAB — CBC WITH DIFFERENTIAL (CANCER CENTER ONLY)
Abs Immature Granulocytes: 0.04 10*3/uL (ref 0.00–0.07)
Basophils Absolute: 0.1 10*3/uL (ref 0.0–0.1)
Basophils Relative: 1 %
Eosinophils Absolute: 0.4 10*3/uL (ref 0.0–0.5)
Eosinophils Relative: 4 %
HCT: 40 % (ref 39.0–52.0)
Hemoglobin: 13 g/dL (ref 13.0–17.0)
Immature Granulocytes: 0 %
Lymphocytes Relative: 28 %
Lymphs Abs: 2.7 10*3/uL (ref 0.7–4.0)
MCH: 27.1 pg (ref 26.0–34.0)
MCHC: 32.5 g/dL (ref 30.0–36.0)
MCV: 83.3 fL (ref 80.0–100.0)
Monocytes Absolute: 0.9 10*3/uL (ref 0.1–1.0)
Monocytes Relative: 9 %
Neutro Abs: 5.5 10*3/uL (ref 1.7–7.7)
Neutrophils Relative %: 58 %
Platelet Count: 212 10*3/uL (ref 150–400)
RBC: 4.8 MIL/uL (ref 4.22–5.81)
RDW: 14.5 % (ref 11.5–15.5)
WBC Count: 9.5 10*3/uL (ref 4.0–10.5)
nRBC: 0 % (ref 0.0–0.2)

## 2021-07-28 MED ORDER — LEUPROLIDE ACETATE (4 MONTH) 30 MG ~~LOC~~ KIT
30.0000 mg | PACK | Freq: Once | SUBCUTANEOUS | Status: AC
  Administered 2021-07-28: 30 mg via SUBCUTANEOUS
  Filled 2021-07-28: qty 30

## 2021-07-28 NOTE — Progress Notes (Signed)
Hematology and Oncology Follow Up Visit  Adam Cruz [id number] 1961/04/24 60 y.o. 07/28/2021 12:42 PM [name], MDAmin, Marlene Lard, MD   Principle Diagnosis: 60 year old man with advanced prostate cancer with lymphadenopathy diagnosed in 2018.  He had castration-sensitive after presenting with a PSA of 68.  Prior Therapy:  Androgen deprivation therapy started in July 2018.  Zytiga 1000 mg daily started in August 2018.  His dose was reduced to 750 mg daily started in March 2019.  Therapy discontinued in March 2020.  Current therapy:  Eligard 30 mg every 4 months.  Next Eligard will be given on August 10 and repeated in 4 months.   Interim History: [name] is here for repeat evaluation.  Since her last visit, he reports no major changes in his health.  He denies any bone pain or pathological fractures.  He denies any excessive weakness or tiredness.  His performance status quality of life remains unchanged.  He denied complications to Eligard including hot flashes or excessive fatigue.              Medications: Unchanged on review. Current Outpatient Medications  Medication Sig Dispense Refill   amitriptyline (ELAVIL) 25 MG tablet Take 25 mg by mouth every evening.      ASPIRIN 81 PO Take 81 mg by mouth at bedtime.   6   Cholecalciferol (VITAMIN D3) 5000 units CAPS Take 1 capsule by mouth daily.      fluticasone (FLONASE) 50 MCG/ACT nasal spray Place 1 spray into both nostrils daily.     Gabapentin Enacarbil 600 MG TBCR Take 600 mg by mouth 2 (two) times daily.      Insulin Glargine-Lixisenatide 100-33 UNT-MCG/ML SOPN Inject 65 Units into the skin daily.      levothyroxine (SYNTHROID, LEVOTHROID) 112 MCG tablet Take 112 mcg by mouth daily.     lisinopril (ZESTRIL) 5 MG tablet Take 5 mg by mouth daily. (Patient not taking: Reported on 03/23/2021)     NOVOLOG FLEXPEN 100 UNIT/ML FlexPen Inject 12 Units into the skin daily as needed.     omeprazole (PRILOSEC) 40 MG capsule  Take 40 mg by mouth daily.      rosuvastatin (CRESTOR) 40 MG tablet      SYNJARDY XR 12.04-999 MG TB24 Take by mouth.     ticagrelor (BRILINTA) 90 MG TABS tablet Take 90 mg by mouth 2 (two) times daily.      TRULICITY 3 0000000 SOPN      ZYTIGA 250 MG tablet TAKE 3 TABLETS BY MOUTH EVERY DAY ON AN EMPTY STOMACH 1 HOUR BEFORE OR 2 HOURS AFTER A MEAL (Patient not taking: No sig reported) 90 tablet 0   No current facility-administered medications for this visit.     Allergies: No Known Allergies    Physical Exam:     Blood pressure 119/72, pulse 83, temperature (!) 96.7 F (35.9 C), temperature source Tympanic, resp. rate 18, weight 198 lb 8 oz (90 kg).      ECOG: 1   General appearance: Alert, awake without any distress. Head: Atraumatic without abnormalities Oropharynx: Without any thrush or ulcers. Eyes: No scleral icterus. Lymph nodes: No lymphadenopathy noted in the cervical, supraclavicular, or axillary nodes Heart:regular rate and rhythm, without any murmurs or gallops.   Lung: Clear to auscultation without any rhonchi, wheezes or dullness to percussion. Abdomin: Soft, nontender without any shifting dullness or ascites. Musculoskeletal: No clubbing or cyanosis. Neurological: No motor or sensory deficits. Skin: No rashes or lesions.  Lab Results: Lab Results  Component Value Date   WBC 9.6 03/23/2021   HGB 12.0 (L) 03/23/2021   HCT 38.3 (L) 03/23/2021   MCV 89.7 03/23/2021   PLT 243 03/23/2021     Chemistry      Component Value Date/Time   NA 141 03/23/2021 0851   NA 140 11/21/2017 1444   K 4.9 03/23/2021 0851   K 3.6 11/21/2017 1444   CL 107 03/23/2021 0851   CO2 21 (L) 03/23/2021 0851   CO2 25 11/21/2017 1444   BUN 13 03/23/2021 0851   BUN 14.2 11/21/2017 1444   CREATININE 1.04 03/23/2021 0851   CREATININE 1.1 11/21/2017 1444      Component Value Date/Time   CALCIUM 10.2 03/23/2021 0851   CALCIUM 9.4 11/21/2017 1444    ALKPHOS 46 03/23/2021 0851   ALKPHOS 119 11/21/2017 1444   AST 23 03/23/2021 0851   AST 29 11/21/2017 1444   ALT 22 03/23/2021 0851   ALT 32 11/21/2017 1444   BILITOT 0.4 03/23/2021 0851   BILITOT 0.40 11/21/2017 1444       Results for Adam, Cruz (MRN [id number]) as of 07/28/2021 12:44  Ref. Range 03/23/2021 08:51  Prostate Specific Ag, Serum Latest Ref Range: 0.0 - 4.0 ng/mL <1.32    60 year old man with:    1.  Advanced prostate cancer with lymphadenopathy diagnosed in 2018.  He has castration-sensitive disease at this time.  His PSA continues to be undetectable on androgen deprivation.  Risks and benefits of additional treatments including Xtandi, Zytiga and systemic chemotherapy were reviewed and he opted against it.  These options will be deferred unless he has castration-resistant disease.  The plan is to update his staging scans in December and determine any future therapy accordingly.  2. Androgen deprivation therapy: He will receive Eligard today and repeated in 4 months.  Complications that include weight gain, hot flashes among others were discussed.  3.  Hypertension: His blood pressure is within normal range since discontinuation of Zytiga.  4.  Neuropathy: Manageable at this time without any further deterioration.  5. Follow-up: In 4 months for repeat evaluation.   30 minutes were dedicated to this visit.  The time was spent on reviewing laboratory data, disease status update, treatment choices and future plan of care discussion.  Zola Button, MD 8/10/202212:42 PM

## 2021-07-28 NOTE — Patient Instructions (Signed)
Leuprolide depot injection What is this medication? LEUPROLIDE (loo PROE lide) is a man-made protein that acts like a natural hormone in the body. It decreases testosterone in men and decreases estrogen in women. In men, this medicine is used to treat advanced prostate cancer. In women, some forms of this medicine may be used to treat endometriosis, uterinefibroids, or other male hormone-related problems. This medicine may be used for other purposes; ask your health care provider orpharmacist if you have questions. COMMON BRAND NAME(S): Eligard, Fensolv, Lupron Depot, Lupron Depot-Ped, Viadur What should I tell my care team before I take this medication? They need to know if you have any of these conditions: diabetes heart disease or previous heart attack high blood pressure high cholesterol mental illness osteoporosis pain or difficulty passing urine seizures spinal cord metastasis stroke suicidal thoughts, plans, or attempt; a previous suicide attempt by you or a family member tobacco smoker unusual vaginal bleeding (women) an unusual or allergic reaction to leuprolide, benzyl alcohol, other medicines, foods, dyes, or preservatives pregnant or trying to get pregnant breast-feeding How should I use this medication? This medicine is for injection into a muscle or for injection under the skin. It is given by a health care professional in a hospital or clinic setting. The specific product will determine how it will be given to you. Make sure youunderstand which product you receive and how often you will receive it. Talk to your pediatrician regarding the use of this medicine in children.Special care may be needed. Overdosage: If you think you have taken too much of this medicine contact apoison control center or emergency room at once. NOTE: This medicine is only for you. Do not share this medicine with others. What if I miss a dose? It is important not to miss a dose. Call your doctor  or health careprofessional if you are unable to keep an appointment. Depot injections: Depot injections are given either once-monthly, every 12 weeks, every 16 weeks, or every 24 weeks depending on the product you are prescribed. The product you are prescribed will be based on if you are male orfemale, and your condition. Make sure you understand your product and dosing. What may interact with this medication? Do not take this medicine with any of the following medications: chasteberry cisapride dronedarone pimozide thioridazine This medicine may also interact with the following medications: herbal or dietary supplements, like black cohosh or DHEA male hormones, like estrogens or progestins and birth control pills, patches, rings, or injections male hormones, like testosterone other medicines that prolong the QT interval (abnormal heart rhythm) This list may not describe all possible interactions. Give your health care provider a list of all the medicines, herbs, non-prescription drugs, or dietary supplements you use. Also tell them if you smoke, drink alcohol, or use illegaldrugs. Some items may interact with your medicine. What should I watch for while using this medication? Visit your doctor or health care professional for regular checks on your progress. During the first weeks of treatment, your symptoms may get worse, but then will improve as you continue your treatment. You may get hot flashes, increased bone pain, increased difficulty passing urine, or an aggravation of nerve symptoms. Discuss these effects with your doctor or health careprofessional, some of them may improve with continued use of this medicine. Male patients may experience a menstrual cycle or spotting during the first months of therapy with this medicine. If this continues, contact your doctor orhealth care professional. This medicine may increase blood sugar. Ask  your healthcare provider if changesin diet or medicines  are needed if you have diabetes. What side effects may I notice from receiving this medication? Side effects that you should report to your doctor or health care professionalas soon as possible: allergic reactions like skin rash, itching or hives, swelling of the face, lips, or tongue breathing problems chest pain depression or memory disorders pain in your legs or groin pain at site where injected or implanted seizures severe headache signs and symptoms of high blood sugar such as being more thirsty or hungry or having to urinate more than normal. You may also feel very tired or have blurry vision swelling of the feet and legs suicidal thoughts or other mood changes visual changes vomiting Side effects that usually do not require medical attention (report to yourdoctor or health care professional if they continue or are bothersome): breast swelling or tenderness decrease in sex drive or performance diarrhea hot flashes loss of appetite muscle, joint, or bone pains nausea redness or irritation at site where injected or implanted skin problems or acne This list may not describe all possible side effects. Call your doctor for medical advice about side effects. You may report side effects to FDA at1-800-FDA-1088. Where should I keep my medication? This drug is given in a hospital or clinic and will not be stored at home. NOTE: This sheet is a summary. It may not cover all possible information. If you have questions about this medicine, talk to your doctor, pharmacist, orhealth care provider.  2022 Elsevier/Gold Standard (2019-11-06 10:35:13)

## 2021-07-29 ENCOUNTER — Telehealth: Payer: Self-pay | Admitting: *Deleted

## 2021-07-29 LAB — PROSTATE-SPECIFIC AG, SERUM (LABCORP): Prostate Specific Ag, Serum: <0.1 ng/mL (ref 0.0–4.0)

## 2021-07-29 NOTE — Telephone Encounter (Signed)
PC to patient, no answer, left VM regarding PSA level <0.1  Instructed pt to call office with any questions/concerns.

## 2021-07-29 NOTE — Telephone Encounter (Signed)
-----   Message from Wyatt Portela, MD sent at 07/29/2021  8:37 AM EDT ----- Please let him know his PSA is low

## 2021-08-12 DIAGNOSIS — E113511 Type 2 diabetes mellitus with proliferative diabetic retinopathy with macular edema, right eye: Secondary | ICD-10-CM | POA: Diagnosis not present

## 2021-08-26 DIAGNOSIS — Z01818 Encounter for other preprocedural examination: Secondary | ICD-10-CM | POA: Diagnosis not present

## 2021-09-07 DIAGNOSIS — E113511 Type 2 diabetes mellitus with proliferative diabetic retinopathy with macular edema, right eye: Secondary | ICD-10-CM | POA: Diagnosis not present

## 2021-09-07 DIAGNOSIS — H4311 Vitreous hemorrhage, right eye: Secondary | ICD-10-CM | POA: Diagnosis not present

## 2021-09-23 DIAGNOSIS — H4311 Vitreous hemorrhage, right eye: Secondary | ICD-10-CM | POA: Diagnosis not present

## 2021-09-28 DIAGNOSIS — R0789 Other chest pain: Secondary | ICD-10-CM | POA: Diagnosis not present

## 2021-10-28 DIAGNOSIS — E113511 Type 2 diabetes mellitus with proliferative diabetic retinopathy with macular edema, right eye: Secondary | ICD-10-CM | POA: Diagnosis not present

## 2021-10-29 DIAGNOSIS — I10 Essential (primary) hypertension: Secondary | ICD-10-CM | POA: Diagnosis not present

## 2021-10-29 DIAGNOSIS — Z23 Encounter for immunization: Secondary | ICD-10-CM | POA: Diagnosis not present

## 2021-10-29 DIAGNOSIS — E782 Mixed hyperlipidemia: Secondary | ICD-10-CM | POA: Diagnosis not present

## 2021-10-29 DIAGNOSIS — Z0000 Encounter for general adult medical examination without abnormal findings: Secondary | ICD-10-CM | POA: Diagnosis not present

## 2021-10-29 DIAGNOSIS — Z1331 Encounter for screening for depression: Secondary | ICD-10-CM | POA: Diagnosis not present

## 2021-10-29 DIAGNOSIS — Z6828 Body mass index (BMI) 28.0-28.9, adult: Secondary | ICD-10-CM | POA: Diagnosis not present

## 2021-10-29 DIAGNOSIS — E039 Hypothyroidism, unspecified: Secondary | ICD-10-CM | POA: Diagnosis not present

## 2021-11-24 ENCOUNTER — Inpatient Hospital Stay: Payer: BC Managed Care – PPO | Attending: Oncology

## 2021-11-24 ENCOUNTER — Ambulatory Visit (HOSPITAL_COMMUNITY)
Admission: RE | Admit: 2021-11-24 | Discharge: 2021-11-24 | Disposition: A | Discharge status: Home / Self Care | Payer: BC Managed Care – PPO | Source: Ambulatory Visit | Attending: Oncology | Admitting: Oncology

## 2021-11-24 ENCOUNTER — Other Ambulatory Visit: Payer: Self-pay

## 2021-11-24 DIAGNOSIS — C61 Malignant neoplasm of prostate: Secondary | ICD-10-CM | POA: Insufficient documentation

## 2021-11-24 DIAGNOSIS — R590 Localized enlarged lymph nodes: Secondary | ICD-10-CM | POA: Diagnosis not present

## 2021-11-24 DIAGNOSIS — I700 Atherosclerosis of aorta: Secondary | ICD-10-CM | POA: Diagnosis not present

## 2021-11-24 DIAGNOSIS — J9811 Atelectasis: Secondary | ICD-10-CM | POA: Diagnosis not present

## 2021-11-24 DIAGNOSIS — Z5111 Encounter for antineoplastic chemotherapy: Secondary | ICD-10-CM | POA: Insufficient documentation

## 2021-11-24 DIAGNOSIS — I2510 Atherosclerotic heart disease of native coronary artery without angina pectoris: Secondary | ICD-10-CM | POA: Diagnosis not present

## 2021-11-24 LAB — CBC WITH DIFFERENTIAL (CANCER CENTER ONLY)
Abs Immature Granulocytes: 0.04 10*3/uL (ref 0.00–0.07)
Basophils Absolute: 0.1 10*3/uL (ref 0.0–0.1)
Basophils Relative: 1 %
Eosinophils Absolute: 0.2 10*3/uL (ref 0.0–0.5)
Eosinophils Relative: 2 %
HCT: 40.5 % (ref 39.0–52.0)
Hemoglobin: 13 g/dL (ref 13.0–17.0)
Immature Granulocytes: 0 %
Lymphocytes Relative: 8 %
Lymphs Abs: 0.8 10*3/uL (ref 0.7–4.0)
MCH: 25.9 pg — ABNORMAL LOW (ref 26.0–34.0)
MCHC: 32.1 g/dL (ref 30.0–36.0)
MCV: 80.8 fL (ref 80.0–100.0)
Monocytes Absolute: 1.5 10*3/uL — ABNORMAL HIGH (ref 0.1–1.0)
Monocytes Relative: 15 %
Neutro Abs: 7.7 10*3/uL (ref 1.7–7.7)
Neutrophils Relative %: 74 %
Platelet Count: 228 10*3/uL (ref 150–400)
RBC: 5.01 MIL/uL (ref 4.22–5.81)
RDW: 14.9 % (ref 11.5–15.5)
WBC Count: 10.3 10*3/uL (ref 4.0–10.5)
nRBC: 0 % (ref 0.0–0.2)

## 2021-11-24 LAB — CMP (CANCER CENTER ONLY)
ALT: 18 U/L (ref 0–44)
AST: 18 U/L (ref 15–41)
Albumin: 4.1 g/dL (ref 3.5–5.0)
Alkaline Phosphatase: 85 U/L (ref 38–126)
Anion gap: 12 (ref 5–15)
BUN: 24 mg/dL — ABNORMAL HIGH (ref 6–20)
CO2: 20 mmol/L — ABNORMAL LOW (ref 22–32)
Calcium: 9.6 mg/dL (ref 8.9–10.3)
Chloride: 107 mmol/L (ref 98–111)
Creatinine: 1.36 mg/dL — ABNORMAL HIGH (ref 0.61–1.24)
GFR, Estimated: 60 mL/min — ABNORMAL LOW (ref >60)
Glucose, Bld: 138 mg/dL — ABNORMAL HIGH (ref 70–99)
Potassium: 4.5 mmol/L (ref 3.5–5.1)
Sodium: 139 mmol/L (ref 135–145)
Total Bilirubin: 0.3 mg/dL (ref 0.3–1.2)
Total Protein: 7.5 g/dL (ref 6.5–8.1)

## 2021-11-24 MED ORDER — SODIUM CHLORIDE (PF) 0.9 % IJ SOLN
INTRAMUSCULAR | Status: AC
  Filled 2021-11-24: qty 50

## 2021-11-24 MED ORDER — IOHEXOL 350 MG/ML SOLN
80.0000 mL | Freq: Once | INTRAVENOUS | Status: AC | PRN
  Administered 2021-11-24: 80 mL via INTRAVENOUS

## 2021-11-25 LAB — PROSTATE-SPECIFIC AG, SERUM (LABCORP): Prostate Specific Ag, Serum: <0.1 ng/mL (ref 0.0–4.0)

## 2021-12-02 ENCOUNTER — Other Ambulatory Visit: Payer: Self-pay

## 2021-12-02 ENCOUNTER — Inpatient Hospital Stay (HOSPITAL_BASED_OUTPATIENT_CLINIC_OR_DEPARTMENT_OTHER): Payer: BC Managed Care – PPO | Admitting: Oncology

## 2021-12-02 ENCOUNTER — Inpatient Hospital Stay: Payer: BC Managed Care – PPO

## 2021-12-02 VITALS — BP 121/72 | HR 81 | Temp 97.8°F | Resp 17 | Ht 72.0 in | Wt 202.7 lb

## 2021-12-02 DIAGNOSIS — Z5111 Encounter for antineoplastic chemotherapy: Secondary | ICD-10-CM | POA: Diagnosis not present

## 2021-12-02 DIAGNOSIS — C61 Malignant neoplasm of prostate: Secondary | ICD-10-CM

## 2021-12-02 MED ORDER — LEUPROLIDE ACETATE (4 MONTH) 30 MG ~~LOC~~ KIT
30.0000 mg | PACK | Freq: Once | SUBCUTANEOUS | Status: AC
  Administered 2021-12-02: 30 mg via SUBCUTANEOUS
  Filled 2021-12-02: qty 30

## 2021-12-02 NOTE — Progress Notes (Signed)
Hematology and Oncology Follow Up Visit  Adam Cruz [id number] [birth year] 60 y.o. 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  Prior Therapy:  Androgen deprivation therapy started in July 2018.  Zytiga 1000 mg daily started in August 2018.  His dose was reduced to 750 mg daily started in March 2019.  Therapy discontinued in March 2020.  Current therapy:  Eligard 30 mg every 4 months.  He will receive Eligard today and repeated in 4 months.   Interim History: Adam Cruz returns today for repeat evaluation.  Since the last visit, he reports no major changes in his health.  He denies any nausea, vomiting or abdominal pain.  He denies any progressive weight gain or appetite changes.  His performance status quality of life remains unchanged.  He denies any bone pain or pathological fractures.  He denies any hot flashes.              Medications: Reviewed without changes Current Outpatient Medications  Medication Sig Dispense Refill   amitriptyline (ELAVIL) 25 MG tablet Take 25 mg by mouth every evening.      ASPIRIN 81 PO Take 81 mg by mouth at bedtime.   6   Cholecalciferol (VITAMIN D3) 5000 units CAPS Take 1 capsule by mouth daily.      fluticasone (FLONASE) 50 MCG/ACT nasal spray Place 1 spray into both nostrils daily.     Gabapentin Enacarbil 600 MG TBCR Take 600 mg by mouth 2 (two) times daily.      Insulin Glargine-Lixisenatide 100-33 UNT-MCG/ML SOPN Inject 65 Units into the skin daily.      levothyroxine (SYNTHROID, LEVOTHROID) 112 MCG tablet Take 112 mcg by mouth daily.     lisinopril (ZESTRIL) 5 MG tablet Take 5 mg by mouth daily. (Patient not taking: Reported on 03/23/2021)     NOVOLOG FLEXPEN 100 UNIT/ML FlexPen Inject 12 Units into the skin daily as needed.     omeprazole (PRILOSEC) 40 MG capsule Take 40 mg by  mouth daily.      rosuvastatin (CRESTOR) 40 MG tablet      SYNJARDY XR 12.04-999 MG TB24 Take by mouth.     ticagrelor (BRILINTA) 90 MG TABS tablet Take 90 mg by mouth 2 (two) times daily.      TRULICITY 3 ZY/6.0YT SOPN      ZYTIGA 250 MG tablet TAKE 3 TABLETS BY MOUTH EVERY DAY ON AN EMPTY STOMACH 1 HOUR BEFORE OR 2 HOURS AFTER A MEAL (Patient not taking: No sig reported) 90 tablet 0   No current facility-administered medications for this visit.     Allergies: No Known Allergies    Physical Exam:      Blood pressure 121/72, pulse 81, temperature 97.8 F (36.6 C), temperature source Temporal, resp. rate 17, height 6' (1.829 m), weight 202 lb 11.2 oz (91.9 kg), SpO2 97 %.      ECOG: 1   General appearance: Comfortable appearing without any discomfort Head: Normocephalic without any trauma Oropharynx: Mucous membranes are moist and pink without any thrush or ulcers. Eyes: Pupils are equal and round reactive to light. Lymph nodes: No cervical, supraclavicular, inguinal or axillary lymphadenopathy.   Heart:regular rate and rhythm.  S1 and S2 without leg edema. Lung: Clear without any rhonchi or wheezes.  No dullness to percussion. Abdomin: Soft, nontender, nondistended with good bowel sounds.  No hepatosplenomegaly.  Musculoskeletal: No joint deformity or effusion.  Full range of motion noted. Neurological: No deficits noted on motor, sensory and deep tendon reflex exam. Skin: No petechial rash or dryness.  Appeared moist.              Lab Results: Lab Results  Component Value Date   WBC 10.3 11/24/2021   HGB 13.0 11/24/2021   HCT 40.5 11/24/2021   MCV 80.8 11/24/2021   PLT 228 11/24/2021     Chemistry      Component Value Date/Time   NA 139 11/24/2021 1154   NA 140 11/21/2017 1444   K 4.5 11/24/2021 1154   K 3.6 11/21/2017 1444   CL 107 11/24/2021 1154   CO2 20 (L) 11/24/2021 1154   CO2 25 11/21/2017 1444   BUN 24 (H) 11/24/2021 1154   BUN 14.2  11/21/2017 1444   CREATININE 1.36 (H) 11/24/2021 1154   CREATININE 1.1 11/21/2017 1444      Component Value Date/Time   CALCIUM 9.6 11/24/2021 1154   CALCIUM 9.4 11/21/2017 1444   ALKPHOS 85 11/24/2021 1154   ALKPHOS 119 11/21/2017 1444   AST 18 11/24/2021 1154   AST 29 11/21/2017 1444   ALT 18 11/24/2021 1154   ALT 32 11/21/2017 1444   BILITOT 0.3 11/24/2021 1154   BILITOT 0.40 11/21/2017 1444       Latest Reference Range & Units 11/24/21 11:54  Prostate Specific Ag, Serum 0.0 - 4.0 ng/mL <0.1     CT ABDOMEN AND PELVIS FINDINGS   Hepatobiliary: No focal hepatic lesion. No biliary ductal dilatation. Gallbladder is normal. Common bile duct is normal.   Pancreas: Pancreas is normal. No ductal dilatation. No pancreatic inflammation.   Spleen: Normal spleen   Adrenals/urinary tract: Adrenal glands and kidneys are normal. The ureters and bladder normal.   Stomach/Bowel: Stomach, small-bowel and cecum are normal. The appendix is not identified but there is no pericecal inflammation to suggest appendicitis. Moderate volume stool throughout the colon similar prior. No obstructing lesion   Vascular/Lymphatic: Abdominal aorta is normal caliber. There is no retroperitoneal or periportal lymphadenopathy. No pelvic lymphadenopathy.   Reproductive: Prostate small. No pelvic lymphadenopathy. Periaortic adenopathy.   Other: No free fluid.   Musculoskeletal: No aggressive osseous lesion.   IMPRESSION: 1. No evidence of prostate cancer metastasis. 2. Stable small pulmonary nodules. 3. No pelvic lymphadenopathy. 4. No skeletal metastasis.    60 year old man with:    1.  Castration-sensitive advanced prostate cancer with lymphadenopathy diagnosed in 2018.    His disease status was updated at this time and treatment choices were reviewed.  His PSA continues to be undetectable with CT scan shows no evidence of residual disease or lymphadenopathy.  Risks and benefits of  continuing the current approach and different salvage therapy options were discussed.  These would include oral targeted therapy and systemic chemotherapy.  At this time these therapies will be deferred unless he has progression of disease.  2. Androgen deprivation therapy: Complications related to Eligard were reviewed including weight gain, hot flashes among others were reviewed.  He is agreeable to continue.  3.  Hypertension: Resolved at this time with blood pressure is normal.   4. Follow-up: will return in 4 months for a follow-up visit.   30 minutes were spent on this encounter.  The time was dedicated to reviewing laboratory data, disease status update and outlining future plan of care discussion.  Zola Button, MD 12/15/20222:51 PM

## 2021-12-02 NOTE — Patient Instructions (Signed)
Leuprolide depot injection What is this medication? LEUPROLIDE (loo PROE lide) is a man-made protein that acts like a natural hormone in the body. It decreases testosterone in men and decreases estrogen in women. In men, this medicine is used to treat advanced prostate cancer. In women, some forms of this medicine may be used to treat endometriosis, uterine fibroids, or other male hormone-related problems. This medicine may be used for other purposes; ask your health care provider or pharmacist if you have questions. COMMON BRAND NAME(S): Eligard, Fensolv, Lupron Depot, Lupron Depot-Ped, Viadur What should I tell my care team before I take this medication? They need to know if you have any of these conditions: diabetes heart disease or previous heart attack high blood pressure high cholesterol mental illness osteoporosis pain or difficulty passing urine seizures spinal cord metastasis stroke suicidal thoughts, plans, or attempt; a previous suicide attempt by you or a family member tobacco smoker unusual vaginal bleeding (women) an unusual or allergic reaction to leuprolide, benzyl alcohol, other medicines, foods, dyes, or preservatives pregnant or trying to get pregnant breast-feeding How should I use this medication? This medicine is for injection into a muscle or for injection under the skin. It is given by a health care professional in a hospital or clinic setting. The specific product will determine how it will be given to you. Make sure you understand which product you receive and how often you will receive it. Talk to your pediatrician regarding the use of this medicine in children. Special care may be needed. Overdosage: If you think you have taken too much of this medicine contact a poison control center or emergency room at once. NOTE: This medicine is only for you. Do not share this medicine with others. What if I miss a dose? It is important not to miss a dose. Call your  doctor or health care professional if you are unable to keep an appointment. Depot injections: Depot injections are given either once-monthly, every 12 weeks, every 16 weeks, or every 24 weeks depending on the product you are prescribed. The product you are prescribed will be based on if you are male or male, and your condition. Make sure you understand your product and dosing. What may interact with this medication? Do not take this medicine with any of the following medications: chasteberry cisapride dronedarone pimozide thioridazine This medicine may also interact with the following medications: herbal or dietary supplements, like black cohosh or DHEA male hormones, like estrogens or progestins and birth control pills, patches, rings, or injections male hormones, like testosterone other medicines that prolong the QT interval (abnormal heart rhythm) This list may not describe all possible interactions. Give your health care provider a list of all the medicines, herbs, non-prescription drugs, or dietary supplements you use. Also tell them if you smoke, drink alcohol, or use illegal drugs. Some items may interact with your medicine. What should I watch for while using this medication? Visit your doctor or health care professional for regular checks on your progress. During the first weeks of treatment, your symptoms may get worse, but then will improve as you continue your treatment. You may get hot flashes, increased bone pain, increased difficulty passing urine, or an aggravation of nerve symptoms. Discuss these effects with your doctor or health care professional, some of them may improve with continued use of this medicine. Male patients may experience a menstrual cycle or spotting during the first months of therapy with this medicine. If this continues, contact your doctor or  health care professional. This medicine may increase blood sugar. Ask your healthcare provider if changes in diet  or medicines are needed if you have diabetes. What side effects may I notice from receiving this medication? Side effects that you should report to your doctor or health care professional as soon as possible: allergic reactions like skin rash, itching or hives, swelling of the face, lips, or tongue breathing problems chest pain depression or memory disorders pain in your legs or groin pain at site where injected or implanted seizures severe headache signs and symptoms of high blood sugar such as being more thirsty or hungry or having to urinate more than normal. You may also feel very tired or have blurry vision swelling of the feet and legs suicidal thoughts or other mood changes visual changes vomiting Side effects that usually do not require medical attention (report to your doctor or health care professional if they continue or are bothersome): breast swelling or tenderness decrease in sex drive or performance diarrhea hot flashes loss of appetite muscle, joint, or bone pains nausea redness or irritation at site where injected or implanted skin problems or acne This list may not describe all possible side effects. Call your doctor for medical advice about side effects. You may report side effects to FDA at 1-800-FDA-1088. Where should I keep my medication? This drug is given in a hospital or clinic and will not be stored at home. NOTE: This sheet is a summary. It may not cover all possible information. If you have questions about this medicine, talk to your doctor, pharmacist, or health care provider.  2022 [employer] (2021-08-24 00:00:00)

## 2021-12-10 ENCOUNTER — Telehealth: Payer: Self-pay | Admitting: Oncology

## 2021-12-10 NOTE — Telephone Encounter (Signed)
Sch per 12/15 los, left msg

## 2022-02-02 DIAGNOSIS — E113511 Type 2 diabetes mellitus with proliferative diabetic retinopathy with macular edema, right eye: Secondary | ICD-10-CM | POA: Diagnosis not present

## 2022-02-02 DIAGNOSIS — E1142 Type 2 diabetes mellitus with diabetic polyneuropathy: Secondary | ICD-10-CM | POA: Diagnosis not present

## 2022-02-02 DIAGNOSIS — E782 Mixed hyperlipidemia: Secondary | ICD-10-CM | POA: Diagnosis not present

## 2022-02-02 DIAGNOSIS — E039 Hypothyroidism, unspecified: Secondary | ICD-10-CM | POA: Diagnosis not present

## 2022-03-03 DIAGNOSIS — E113511 Type 2 diabetes mellitus with proliferative diabetic retinopathy with macular edema, right eye: Secondary | ICD-10-CM | POA: Diagnosis not present

## 2022-03-15 ENCOUNTER — Telehealth: Payer: Self-pay | Admitting: Oncology

## 2022-03-15 NOTE — Telephone Encounter (Signed)
Rescheduled 04/14 appointment to 04/07 due to provider on-call, called and left a voicemail. ?

## 2022-03-16 ENCOUNTER — Telehealth: Payer: Self-pay | Admitting: Oncology

## 2022-03-16 NOTE — Telephone Encounter (Signed)
.  Called patient to schedule appointment per 3/28 inbasket, patient is aware of date and time.   ?

## 2022-03-25 ENCOUNTER — Ambulatory Visit: Payer: BC Managed Care – PPO

## 2022-03-25 ENCOUNTER — Ambulatory Visit: Payer: BC Managed Care – PPO | Admitting: Oncology

## 2022-03-25 ENCOUNTER — Other Ambulatory Visit: Payer: BC Managed Care – PPO

## 2022-04-01 ENCOUNTER — Ambulatory Visit: Payer: BC Managed Care – PPO

## 2022-04-01 ENCOUNTER — Ambulatory Visit: Payer: BC Managed Care – PPO | Admitting: Oncology

## 2022-04-01 ENCOUNTER — Other Ambulatory Visit: Payer: BC Managed Care – PPO

## 2022-04-08 ENCOUNTER — Inpatient Hospital Stay: Payer: BC Managed Care – PPO | Attending: Oncology

## 2022-04-08 ENCOUNTER — Other Ambulatory Visit: Payer: Self-pay

## 2022-04-08 ENCOUNTER — Inpatient Hospital Stay (HOSPITAL_BASED_OUTPATIENT_CLINIC_OR_DEPARTMENT_OTHER): Payer: BC Managed Care – PPO | Admitting: Oncology

## 2022-04-08 ENCOUNTER — Inpatient Hospital Stay: Payer: BC Managed Care – PPO

## 2022-04-08 VITALS — BP 111/72 | HR 72 | Temp 97.8°F | Resp 17 | Ht 72.0 in | Wt 207.4 lb

## 2022-04-08 DIAGNOSIS — C61 Malignant neoplasm of prostate: Secondary | ICD-10-CM

## 2022-04-08 DIAGNOSIS — Z79899 Other long term (current) drug therapy: Secondary | ICD-10-CM | POA: Diagnosis not present

## 2022-04-08 DIAGNOSIS — Z5111 Encounter for antineoplastic chemotherapy: Secondary | ICD-10-CM | POA: Diagnosis not present

## 2022-04-08 LAB — CMP (CANCER CENTER ONLY)
ALT: 24 U/L (ref 0–44)
AST: 20 U/L (ref 15–41)
Albumin: 4.1 g/dL (ref 3.5–5.0)
Alkaline Phosphatase: 48 U/L (ref 38–126)
Anion gap: 7 (ref 5–15)
BUN: 19 mg/dL (ref 6–20)
CO2: 23 mmol/L (ref 22–32)
Calcium: 9.7 mg/dL (ref 8.9–10.3)
Chloride: 108 mmol/L (ref 98–111)
Creatinine: 1.21 mg/dL (ref 0.61–1.24)
GFR, Estimated: >60 mL/min (ref >60)
Glucose, Bld: 116 mg/dL — ABNORMAL HIGH (ref 70–99)
Potassium: 4.4 mmol/L (ref 3.5–5.1)
Sodium: 138 mmol/L (ref 135–145)
Total Bilirubin: 0.3 mg/dL (ref 0.3–1.2)
Total Protein: 7 g/dL (ref 6.5–8.1)

## 2022-04-08 LAB — CBC WITH DIFFERENTIAL (CANCER CENTER ONLY)
Abs Immature Granulocytes: 0.04 10*3/uL (ref 0.00–0.07)
Basophils Absolute: 0.1 10*3/uL (ref 0.0–0.1)
Basophils Relative: 1 %
Eosinophils Absolute: 0.3 10*3/uL (ref 0.0–0.5)
Eosinophils Relative: 4 %
HCT: 38.3 % — ABNORMAL LOW (ref 39.0–52.0)
Hemoglobin: 12.3 g/dL — ABNORMAL LOW (ref 13.0–17.0)
Immature Granulocytes: 0 %
Lymphocytes Relative: 28 %
Lymphs Abs: 2.7 10*3/uL (ref 0.7–4.0)
MCH: 27.5 pg (ref 26.0–34.0)
MCHC: 32.1 g/dL (ref 30.0–36.0)
MCV: 85.5 fL (ref 80.0–100.0)
Monocytes Absolute: 0.9 10*3/uL (ref 0.1–1.0)
Monocytes Relative: 10 %
Neutro Abs: 5.6 10*3/uL (ref 1.7–7.7)
Neutrophils Relative %: 57 %
Platelet Count: 209 10*3/uL (ref 150–400)
RBC: 4.48 MIL/uL (ref 4.22–5.81)
RDW: 14.4 % (ref 11.5–15.5)
WBC Count: 9.7 10*3/uL (ref 4.0–10.5)
nRBC: 0 % (ref 0.0–0.2)

## 2022-04-08 MED ORDER — LEUPROLIDE ACETATE (4 MONTH) 30 MG ~~LOC~~ KIT
30.0000 mg | PACK | Freq: Once | SUBCUTANEOUS | Status: AC
  Administered 2022-04-08: 30 mg via SUBCUTANEOUS
  Filled 2022-04-08: qty 30

## 2022-04-08 NOTE — Progress Notes (Signed)
Hematology and Oncology Follow Up Visit ? ?Mattie Marlin Almon ?[id number] ?September 12, 1961 61 y.o. ?04/08/2022 1:08 PM ?[name], MDAmin, Marlene Lard, MD  ? ?Principle Diagnosis: 61 year old man with prostate cancer diagnosed in 2018.  He was found to have castration-sensitive advanced disease with lymphadenopathy, PSA of 68. ? ?Prior Therapy: ? ?Androgen deprivation therapy started in July 2018. ? ?Zytiga 1000 mg daily started in August 2018.  His dose was reduced to 750 mg daily started in March 2019.  Therapy discontinued in March 2020. ? ?Current therapy: ? ?Eligard 30 mg every 4 months.  He will receive this on April 08, 2022. ? ? ?Interim History: Mr. Mckinnon is here for a follow-up visit.  Since the last visit, he reports no major changes in his health.  He denies any recent hospitalizations or illnesses.  He denies any complications related to Eligard.  He denies any hot flashes, weight gain or excessive fatigue.  His performance status and quality of life remains unchanged.  He denies any bone pain or pathological fractures. ? ? ? ? ? ? ? ? ? ? ? ? ? ?Medications: Updated on review. ?Current Outpatient Medications  ?Medication Sig Dispense Refill  ? amitriptyline (ELAVIL) 25 MG tablet Take 25 mg by mouth every evening.     ? ASPIRIN 81 PO Take 81 mg by mouth at bedtime.   6  ? Cholecalciferol (VITAMIN D3) 5000 units CAPS Take 1 capsule by mouth daily.     ? fluticasone (FLONASE) 50 MCG/ACT nasal spray Place 1 spray into both nostrils daily.    ? Gabapentin Enacarbil 600 MG TBCR Take 600 mg by mouth 2 (two) times daily.     ? Insulin Glargine-Lixisenatide 100-33 UNT-MCG/ML SOPN Inject 65 Units into the skin daily.     ? levothyroxine (SYNTHROID, LEVOTHROID) 112 MCG tablet Take 112 mcg by mouth daily.    ? lisinopril (ZESTRIL) 5 MG tablet Take 5 mg by mouth daily. (Patient not taking: Reported on 03/23/2021)    ? NOVOLOG FLEXPEN 100 UNIT/ML FlexPen Inject 12 Units into the skin daily as needed.    ? omeprazole (PRILOSEC) 40  MG capsule Take 40 mg by mouth daily.     ? rosuvastatin (CRESTOR) 40 MG tablet     ? SYNJARDY XR 12.04-999 MG TB24 Take by mouth.    ? ticagrelor (BRILINTA) 90 MG TABS tablet Take 90 mg by mouth 2 (two) times daily.     ? TRULICITY 3 RK/2.7CW SOPN     ? ZYTIGA 250 MG tablet TAKE 3 TABLETS BY MOUTH EVERY DAY ON AN EMPTY STOMACH 1 HOUR BEFORE OR 2 HOURS AFTER A MEAL (Patient not taking: No sig reported) 90 tablet 0  ? ?No current facility-administered medications for this visit.  ? ? ? ?Allergies: No Known Allergies ? ? ? ?Physical Exam: ? ? ? ? ? ?Blood pressure 111/72, pulse 72, temperature 97.8 ?F (36.6 ?C), temperature source Temporal, resp. rate 17, height 6' (1.829 m), weight 207 lb 6.4 oz (94.1 kg), SpO2 97 %. ? ? ? ? ? ? ?ECOG: 1 ? ? ? ?General appearance: Alert, awake without any distress. ?Head: Atraumatic without abnormalities ?Oropharynx: Without any thrush or ulcers. ?Eyes: No scleral icterus. ?Lymph nodes: No lymphadenopathy noted in the cervical, supraclavicular, or axillary nodes ?Heart:regular rate and rhythm, without any murmurs or gallops.   ?Lung: Clear to auscultation without any rhonchi, wheezes or dullness to percussion. ?Abdomin: Soft, nontender without any shifting dullness or ascites. ?Musculoskeletal: No clubbing or cyanosis. ?  Neurological: No motor or sensory deficits. ?Skin: No rashes or lesions. ? ? ? ? ? ? ? ? ? ? ? ? ? ?Lab Results: ?Lab Results  ?Component Value Date  ? WBC 10.3 11/24/2021  ? HGB 13.0 11/24/2021  ? HCT 40.5 11/24/2021  ? MCV 80.8 11/24/2021  ? PLT 228 11/24/2021  ? ?  Chemistry   ?   ?Component Value Date/Time  ? NA 139 11/24/2021 1154  ? NA 140 11/21/2017 1444  ? K 4.5 11/24/2021 1154  ? K 3.6 11/21/2017 1444  ? CL 107 11/24/2021 1154  ? CO2 20 (L) 11/24/2021 1154  ? CO2 25 11/21/2017 1444  ? BUN 24 (H) 11/24/2021 1154  ? BUN 14.2 11/21/2017 1444  ? CREATININE 1.36 (H) 11/24/2021 1154  ? CREATININE 1.1 11/21/2017 1444  ?    ?Component Value Date/Time  ? CALCIUM 9.6  11/24/2021 1154  ? CALCIUM 9.4 11/21/2017 1444  ? ALKPHOS 85 11/24/2021 1154  ? ALKPHOS 119 11/21/2017 1444  ? AST 18 11/24/2021 1154  ? AST 29 11/21/2017 1444  ? ALT 18 11/24/2021 1154  ? ALT 32 11/21/2017 1444  ? BILITOT 0.3 11/24/2021 1154  ? BILITOT 0.40 11/21/2017 1444  ?  ? ? ? Latest Reference Range & Units 11/24/21 11:54  ?Prostate Specific Ag, Serum 0.0 - 4.0 ng/mL <0.1  ? ? ? ? ? ?61 year old man with:  ?  ?1.  Advanced prostate cancer with adenopathy diagnosed in 2018.  He has castration-sensitive disease at this time. ? ?His PSA continues to be undetectable indicating a complete response to therapy at this time.  Imaging studies in December were also reviewed and showed no evidence of disease progression.  At this time I recommended continuing Zytiga.  Risks and benefits of long-term use were reviewed.  These include hypertension, edema and adrenal insufficiency.  Alternative treatment option including systemic chemotherapy will be deferred to a later date. ? ?2. Androgen deprivation therapy: He will receive Eligard today and repeated in 4 months.  Complications that include weight gain, hot flashes and sexual dysfunction were reviewed. ? ?3.  Hypertension: His blood pressure is within normal range at this time.  His blood pressure was elevated related to Zytiga ? ? ?4. Follow-up: In 4 months for repeat visit. ? ? ?30 minutes were spent on this encounter.  The time was dedicated to reviewing laboratory data, disease status update, treatment choices and future plan of care review. ? ?Zola Button, MD ?4/21/20231:08 PM  ?

## 2022-04-08 NOTE — Patient Instructions (Signed)
Leuprolide depot injection ?What is this medication? ?LEUPROLIDE (loo PROE lide) is a man-made protein that acts like a natural hormone in the body. It decreases testosterone in men and decreases estrogen in women. In men, this medicine is used to treat advanced prostate cancer. In women, some forms of this medicine may be used to treat endometriosis, uterine fibroids, or other male hormone-related problems. ?This medicine may be used for other purposes; ask your health care provider or pharmacist if you have questions. ?COMMON BRAND NAME(S): Eligard, Fensolv, Lupron Depot, Lupron Depot-Ped, Viadur ?What should I tell my care team before I take this medication? ?They need to know if you have any of these conditions: ?diabetes ?heart disease or previous heart attack ?high blood pressure ?high cholesterol ?mental illness ?osteoporosis ?pain or difficulty passing urine ?seizures ?spinal cord metastasis ?stroke ?suicidal thoughts, plans, or attempt; a previous suicide attempt by you or a family member ?tobacco smoker ?unusual vaginal bleeding (women) ?an unusual or allergic reaction to leuprolide, benzyl alcohol, other medicines, foods, dyes, or preservatives ?pregnant or trying to get pregnant ?breast-feeding ?How should I use this medication? ?This medicine is for injection into a muscle or for injection under the skin. It is given by a health care professional in a hospital or clinic setting. The specific product will determine how it will be given to you. Make sure you understand which product you receive and how often you will receive it. ?Talk to your pediatrician regarding the use of this medicine in children. Special care may be needed. ?Overdosage: If you think you have taken too much of this medicine contact a poison control center or emergency room at once. ?NOTE: This medicine is only for you. Do not share this medicine with others. ?What if I miss a dose? ?It is important not to miss a dose. Call your  doctor or health care professional if you are unable to keep an appointment. ?Depot injections: Depot injections are given either once-monthly, every 12 weeks, every 16 weeks, or every 24 weeks depending on the product you are prescribed. The product you are prescribed will be based on if you are male or male, and your condition. Make sure you understand your product and dosing. ?What may interact with this medication? ?Do not take this medicine with any of the following medications: ?chasteberry ?cisapride ?dronedarone ?pimozide ?thioridazine ?This medicine may also interact with the following medications: ?herbal or dietary supplements, like black cohosh or DHEA ?male hormones, like estrogens or progestins and birth control pills, patches, rings, or injections ?male hormones, like testosterone ?other medicines that prolong the QT interval (abnormal heart rhythm) ?This list may not describe all possible interactions. Give your health care provider a list of all the medicines, herbs, non-prescription drugs, or dietary supplements you use. Also tell them if you smoke, drink alcohol, or use illegal drugs. Some items may interact with your medicine. ?What should I watch for while using this medication? ?Visit your doctor or health care professional for regular checks on your progress. During the first weeks of treatment, your symptoms may get worse, but then will improve as you continue your treatment. You may get hot flashes, increased bone pain, increased difficulty passing urine, or an aggravation of nerve symptoms. Discuss these effects with your doctor or health care professional, some of them may improve with continued use of this medicine. ?Male patients may experience a menstrual cycle or spotting during the first months of therapy with this medicine. If this continues, contact your doctor or   health care professional. This medicine may increase blood sugar. Ask your healthcare provider if changes in diet  or medicines are needed if you have diabetes. What side effects may I notice from receiving this medication? Side effects that you should report to your doctor or health care professional as soon as possible: allergic reactions like skin rash, itching or hives, swelling of the face, lips, or tongue breathing problems chest pain depression or memory disorders pain in your legs or groin pain at site where injected or implanted seizures severe headache signs and symptoms of high blood sugar such as being more thirsty or hungry or having to urinate more than normal. You may also feel very tired or have blurry vision swelling of the feet and legs suicidal thoughts or other mood changes visual changes vomiting Side effects that usually do not require medical attention (report to your doctor or health care professional if they continue or are bothersome): breast swelling or tenderness decrease in sex drive or performance diarrhea hot flashes loss of appetite muscle, joint, or bone pains nausea redness or irritation at site where injected or implanted skin problems or acne This list may not describe all possible side effects. Call your doctor for medical advice about side effects. You may report side effects to FDA at 1-800-FDA-1088. Where should I keep my medication? This drug is given in a hospital or clinic and will not be stored at home. NOTE: This sheet is a summary. It may not cover all possible information. If you have questions about this medicine, talk to your doctor, pharmacist, or health care provider.  2023 [employer] Standard (2021-11-05 00:00:00)  

## 2022-04-09 LAB — PROSTATE-SPECIFIC AG, SERUM (LABCORP): Prostate Specific Ag, Serum: <0.1 ng/mL (ref 0.0–4.0)

## 2022-04-11 ENCOUNTER — Telehealth: Payer: Self-pay

## 2022-04-11 NOTE — Telephone Encounter (Signed)
Pt advised with VU 

## 2022-04-11 NOTE — Telephone Encounter (Signed)
-----   Message from Wyatt Portela, MD sent at 04/11/2022  9:19 AM EDT ----- ?Please let him know his PSA is down ?

## 2022-05-03 DIAGNOSIS — L578 Other skin changes due to chronic exposure to nonionizing radiation: Secondary | ICD-10-CM | POA: Diagnosis not present

## 2022-05-03 DIAGNOSIS — C44729 Squamous cell carcinoma of skin of left lower limb, including hip: Secondary | ICD-10-CM | POA: Diagnosis not present

## 2022-05-03 DIAGNOSIS — L821 Other seborrheic keratosis: Secondary | ICD-10-CM | POA: Diagnosis not present

## 2022-05-03 DIAGNOSIS — L570 Actinic keratosis: Secondary | ICD-10-CM | POA: Diagnosis not present

## 2022-05-04 DIAGNOSIS — E113511 Type 2 diabetes mellitus with proliferative diabetic retinopathy with macular edema, right eye: Secondary | ICD-10-CM | POA: Diagnosis not present

## 2022-05-04 DIAGNOSIS — E1142 Type 2 diabetes mellitus with diabetic polyneuropathy: Secondary | ICD-10-CM | POA: Diagnosis not present

## 2022-05-04 DIAGNOSIS — E039 Hypothyroidism, unspecified: Secondary | ICD-10-CM | POA: Diagnosis not present

## 2022-05-04 DIAGNOSIS — E782 Mixed hyperlipidemia: Secondary | ICD-10-CM | POA: Diagnosis not present

## 2022-05-09 ENCOUNTER — Encounter: Payer: Self-pay | Admitting: *Deleted

## 2022-05-09 ENCOUNTER — Encounter: Payer: Self-pay | Admitting: Cardiology

## 2022-05-13 DIAGNOSIS — E113511 Type 2 diabetes mellitus with proliferative diabetic retinopathy with macular edema, right eye: Secondary | ICD-10-CM | POA: Insufficient documentation

## 2022-05-13 DIAGNOSIS — I219 Acute myocardial infarction, unspecified: Secondary | ICD-10-CM | POA: Insufficient documentation

## 2022-05-13 DIAGNOSIS — E039 Hypothyroidism, unspecified: Secondary | ICD-10-CM | POA: Insufficient documentation

## 2022-05-13 DIAGNOSIS — I25118 Atherosclerotic heart disease of native coronary artery with other forms of angina pectoris: Secondary | ICD-10-CM | POA: Insufficient documentation

## 2022-05-13 DIAGNOSIS — E782 Mixed hyperlipidemia: Secondary | ICD-10-CM | POA: Insufficient documentation

## 2022-05-13 DIAGNOSIS — I10 Essential (primary) hypertension: Secondary | ICD-10-CM | POA: Insufficient documentation

## 2022-06-06 ENCOUNTER — Ambulatory Visit (INDEPENDENT_AMBULATORY_CARE_PROVIDER_SITE_OTHER): Payer: BC Managed Care – PPO | Admitting: Cardiology

## 2022-06-06 ENCOUNTER — Encounter: Payer: Self-pay | Admitting: Cardiology

## 2022-06-06 VITALS — BP 122/80 | HR 73 | Ht 72.0 in | Wt 202.0 lb

## 2022-06-06 DIAGNOSIS — I25118 Atherosclerotic heart disease of native coronary artery with other forms of angina pectoris: Secondary | ICD-10-CM | POA: Diagnosis not present

## 2022-06-06 DIAGNOSIS — E782 Mixed hyperlipidemia: Secondary | ICD-10-CM | POA: Diagnosis not present

## 2022-06-06 DIAGNOSIS — I10 Essential (primary) hypertension: Secondary | ICD-10-CM

## 2022-06-06 DIAGNOSIS — R011 Cardiac murmur, unspecified: Secondary | ICD-10-CM

## 2022-06-06 HISTORY — DX: Cardiac murmur, unspecified: R01.1

## 2022-06-06 MED ORDER — NITROGLYCERIN 0.4 MG SL SUBL: 0.4000 mg | SUBLINGUAL_TABLET | SUBLINGUAL | 6 refills | Status: DC | PRN

## 2022-06-06 NOTE — Patient Instructions (Signed)
Medication Instructions:  Your physician has recommended you make the following change in your medication:   Use nitroglycerin 1 tablet placed under the tongue at the first sign of chest pain or an angina attack. 1 tablet may be used every 5 minutes as needed, for up to 15 minutes. Do not take more than 3 tablets in 15 minutes. If pain persist call 911 or go to the nearest ED.  *If you need a refill on your cardiac medications before your next appointment, please call your pharmacy*   Lab Work: None ordered If you have labs (blood work) drawn today and your tests are completely normal, you will receive your results only by: [place] (if you have MyChart) OR A paper copy in the mail If you have any lab test that is abnormal or we need to change your treatment, we will call you to review the results.   Testing/Procedures: Your physician has requested that you have a lexiscan myoview. For further information please visit [email or web address]. Please follow instruction sheet, as given.  The test will take approximately 3 to 4 hours to complete; you may bring reading material.  If someone comes with you to your appointment, they will need to remain in the main lobby due to limited space in the testing area.   How to prepare for your Myocardial Perfusion Test: Do not eat or drink 3 hours prior to your test, except you may have water. Do not consume products containing caffeine (regular or decaffeinated) 12 hours prior to your test. (ex: coffee, chocolate, sodas, tea). Do bring a list of your current medications with you.  If not listed below, you may take your medications as normal. Do wear comfortable clothes (no dresses or overalls) and walking shoes, tennis shoes preferred (No heels or open toe shoes are allowed). Do NOT wear cologne, perfume, aftershave, or lotions (deodorant is allowed). If these instructions are not followed, your test will have to be rescheduled.  Your physician  has requested that you have an echocardiogram. Echocardiography is a painless test that uses sound waves to create images of your heart. It provides your doctor with information about the size and shape of your heart and how well your heart's chambers and valves are working. This procedure takes approximately one hour. There are no restrictions for this procedure.   Follow-Up: At [facility], you and your health needs are our priority.  As part of our continuing mission to provide you with exceptional heart care, we have created designated Provider Care Teams.  These Care Teams include your primary Cardiologist (physician) and Advanced Practice Providers (APPs -  Physician Assistants and Nurse Practitioners) who all work together to provide you with the care you need, when you need it.  We recommend signing up for the patient portal called "MyChart".  Sign up information is provided on this After Visit Summary.  MyChart is used to connect with patients for Virtual Visits (Telemedicine).  Patients are able to view lab/test results, encounter notes, upcoming appointments, etc.  Non-urgent messages can be sent to your provider as well.   To learn more about what you can do with MyChart, go to [email or web address].    Your next appointment:   9 month(s)  The format for your next appointment:   In Person  Provider:   Jyl Heinz, MD   Other Instructions Cardiac Nuclear Scan A cardiac nuclear scan is a test that is done to check the flow of blood to your heart. It is  done when you are resting and when you are exercising. The test looks for problems such as: Not enough blood reaching a portion of the heart. The heart muscle not working as it should. You may need this test if: You have heart disease. You have had lab results that are not normal. You have had heart surgery or a balloon procedure to open up blocked arteries (angioplasty). You have chest pain. You have shortness of  breath. In this test, a special dye (tracer) is put into your bloodstream. The tracer will travel to your heart. A camera will then take pictures of your heart to see how the tracer moves through your heart. This test is usually done at a hospital and takes 2-4 hours. Tell a doctor about: Any allergies you have. All medicines you are taking, including vitamins, herbs, eye drops, creams, and over-the-counter medicines. Any problems you or family members have had with anesthetic medicines. Any blood disorders you have. Any surgeries you have had. Any medical conditions you have. Whether you are pregnant or may be pregnant. What are the risks? Generally, this is a safe test. However, problems may occur, such as: Serious chest pain and heart attack. This is only a risk if the stress portion of the test is done. Rapid heartbeat. A feeling of warmth in your chest. This feeling usually does not last long. Allergic reaction to the tracer. What happens before the test? Ask your doctor about changing or stopping your normal medicines. This is important. Follow instructions from your doctor about what you cannot eat or drink. Remove your jewelry on the day of the test. What happens during the test? An IV tube will be inserted into one of your veins. Your doctor will give you a small amount of tracer through the IV tube. You will wait for 20-40 minutes while the tracer moves through your bloodstream. Your heart will be monitored with an electrocardiogram (ECG). You will lie down on an exam table. Pictures of your heart will be taken for about 15-20 minutes. You may also have a stress test. For this test, one of these things may be done: You will be asked to exercise on a treadmill or a stationary bike. You will be given medicines that will make your heart work harder. This is done if you are unable to exercise. When blood flow to your heart has peaked, a tracer will again be given through the IV  tube. After 20-40 minutes, you will get back on the exam table. More pictures will be taken of your heart. Depending on the tracer that is used, more pictures may need to be taken 3-4 hours later. Your IV tube will be removed when the test is over. The test may vary among doctors and hospitals. What happens after the test? Ask your doctor: Whether you can return to your normal schedule, including diet, activities, and medicines. Whether you should drink more fluids. This will help to remove the tracer from your body. Drink enough fluid to keep your pee (urine) pale yellow. Ask your doctor, or the department that is doing the test: When will my results be ready? How will I get my results? Summary A cardiac nuclear scan is a test that is done to check the flow of blood to your heart. Tell your doctor whether you are pregnant or may be pregnant. Before the test, ask your doctor about changing or stopping your normal medicines. This is important. Ask your doctor whether you can return to  your normal activities. You may be asked to drink more fluids. This information is not intended to replace advice given to you by your health care provider. Make sure you discuss any questions you have with your health care provider. Document Revised: 03/27/2019 Document Reviewed: 05/21/2018 Elsevier Patient Education  2021 [place].    Echocardiogram An echocardiogram is a test that uses sound waves (ultrasound) to produce images of the heart. Images from an echocardiogram can provide important information about: Heart size and shape. The size and thickness and movement of your heart's walls. Heart muscle function and strength. Heart valve function or if you have stenosis. Stenosis is when the heart valves are too narrow. If blood is flowing backward through the heart valves (regurgitation). A tumor or infectious growth around the heart valves. Areas of heart muscle that are not working well because  of poor blood flow or injury from a heart attack. Aneurysm detection. An aneurysm is a weak or damaged part of an artery wall. The wall bulges out from the normal force of blood pumping through the body. Tell a health care provider about: Any allergies you have. All medicines you are taking, including vitamins, herbs, eye drops, creams, and over-the-counter medicines. Any blood disorders you have. Any surgeries you have had. Any medical conditions you have. Whether you are pregnant or may be pregnant. What are the risks? Generally, this is a safe test. However, problems may occur, including an allergic reaction to dye (contrast) that may be used during the test. What happens before the test? No specific preparation is needed. You may eat and drink normally. What happens during the test? You will take off your clothes from the waist up and put on a hospital gown. Electrodes or electrocardiogram (ECG)patches may be placed on your chest. The electrodes or patches are then connected to a device that monitors your heart rate and rhythm. You will lie down on a table for an ultrasound exam. A gel will be applied to your chest to help sound waves pass through your skin. A handheld device, called a transducer, will be pressed against your chest and moved over your heart. The transducer produces sound waves that travel to your heart and bounce back (or "echo" back) to the transducer. These sound waves will be captured in real-time and changed into images of your heart that can be viewed on a video monitor. The images will be recorded on a computer and reviewed by your health care provider. You may be asked to change positions or hold your breath for a short time. This makes it easier to get different views or better views of your heart. In some cases, you may receive contrast through an IV in one of your veins. This can improve the quality of the pictures from your heart. The procedure may vary among  health care providers and hospitals.    What can I expect after the test? You may return to your normal, everyday life, including diet, activities, and medicines, unless your health care provider tells you not to do that. Follow these instructions at home: It is up to you to get the results of your test. Ask your health care provider, or the department that is doing the test, when your results will be ready. Keep all follow-up visits. This is important. Summary An echocardiogram is a test that uses sound waves (ultrasound) to produce images of the heart. Images from an echocardiogram can provide important information about the size and shape of  your heart, heart muscle function, heart valve function, and other possible heart problems. You do not need to do anything to prepare before this test. You may eat and drink normally. After the echocardiogram is completed, you may return to your normal, everyday life, unless your health care provider tells you not to do that. This information is not intended to replace advice given to you by your health care provider. Make sure you discuss any questions you have with your health care provider. Document Revised: 07/28/2020 Document Reviewed: 07/28/2020 Elsevier Patient Education  2021 [place]. Nitroglycerin Sublingual Tablets What is this medication? NITROGLYCERIN (nye troe GLI ser in) prevents and treats chest pain (angina). It works by relaxing blood vessels, which decreases the amount of work the heart has to do. It belongs to a group of medications called nitrates. This medicine may be used for other purposes; ask your health care provider or pharmacist if you have questions. COMMON BRAND NAME(S): Nitroquick, Nitrostat, Nitrotab What should I tell my care team before I take this medication? They need to know if you have any of these conditions: Anemia Head injury, recent stroke, or bleeding in the brain Liver disease Previous heart attack An  unusual or allergic reaction to nitroglycerin, other medications, foods, dyes, or preservatives Pregnant or trying to get pregnant Breast-feeding How should I use this medication? Take this medication by mouth as needed. Use at the first sign of an angina attack (chest pain or tightness). You can also take this medication 5 to 10 minutes before an event likely to produce chest pain. Follow the directions exactly as written on the prescription label. Place one tablet under your tongue and let it dissolve. Do not swallow whole. Replace the dose if you accidentally swallow it. It will help if your mouth is not dry. Saliva around the tablet will help it to dissolve more quickly. Do not eat or drink, smoke or chew tobacco while a tablet is dissolving. Sit down when taking this medication. In an angina attack, you should feel better within 5 minutes after your first dose. You can take a dose every 5 minutes up to a total of 3 doses. If you do not feel better or feel worse after 1 dose, call 9-1-1 at once. Do not take more than 3 doses in 15 minutes. Your care team might give you other directions. Follow those directions if they do. Do not take your medication more often than directed. Talk to your care team about the use of this medication in children. Special care may be needed. Overdosage: If you think you have taken too much of this medicine contact a poison control center or emergency room at once. NOTE: This medicine is only for you. Do not share this medicine with others. What if I miss a dose? This does not apply. This medication is only used as needed. What may interact with this medication? Do not take this medication with any of the following: Certain migraine medications like ergotamine and dihydroergotamine (DHE) Medications used to treat erectile dysfunction like sildenafil, tadalafil, and vardenafil Ri[place]iguat This medication may also interact with the  following: Alteplase Aspirin Heparin Medications for high blood pressure Medications for mental depression Other medications used to treat angina Phenothiazines like chlorpromazine, mesoridazine, prochlorperazine, thioridazine This list may not describe all possible interactions. Give your health care provider a list of all the medicines, herbs, non-prescription drugs, or dietary supplements you use. Also tell them if you smoke, drink alcohol, or use illegal drugs.  Some items may interact with your medicine. What should I watch for while using this medication? Tell your care team if you feel your medication is no longer working. Keep this medication with you at all times. Sit or lie down when you take your medication to prevent falling if you feel dizzy or faint after using it. Try to remain calm. This will help you to feel better faster. If you feel dizzy, take several deep breaths and lie down with your feet propped up, or bend forward with your head resting between your knees. You may get drowsy or dizzy. Do not drive, use machinery, or do anything that needs mental alertness until you know how this medication affects you. Do not stand or sit up quickly, especially if you are an older patient. This reduces the risk of dizzy or fainting spells. Alcohol can make you more drowsy and dizzy. Avoid alcoholic drinks. Do not treat yourself for coughs, colds, or pain while you are taking this medication without asking your care team for advice. Some ingredients may increase your blood pressure. What side effects may I notice from receiving this medication? Side effects that you should report to your care team as soon as possible: Allergic reactions--skin rash, itching, hives, swelling of the face, lips, tongue, or throat Headache, unusual weakness or fatigue, shortness of breath, nausea, vomiting, rapid heartbeat, blue skin or lips, which may be signs of methemoglobinemia Increased pressure around the  brain--severe headache, blurry vision, change in vision, nausea, vomiting Low blood pressure--dizziness, feeling faint or lightheaded, blurry vision Slow heartbeat--dizziness, feeling faint or lightheaded, confusion, trouble breathing, unusual weakness or fatigue Worsening chest pain (angina)--pain, pressure, or tightness in the chest, neck, back, or arms Side effects that usually do not require medical attention (report to your care team if they continue or are bothersome): Dizziness Flushing Headache This list may not describe all possible side effects. Call your doctor for medical advice about side effects. You may report side effects to FDA at 1-800-FDA-1088. Where should I keep my medication? Keep out of the reach of children. Store at room temperature between 20 and 25 degrees C (68 and 77 degrees F). Store in [facility] [employer]. Protect from light and moisture. Keep tightly closed. Throw away any unused medication after the expiration date. NOTE: This sheet is a summary. It may not cover all possible information. If you have questions about this medicine, talk to your doctor, pharmacist, or health care provider.  2023 Elsevier/Gold Standard (2021-03-16 00:00:00)
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
## 2022-06-07 ENCOUNTER — Telehealth (HOSPITAL_COMMUNITY): Payer: Self-pay | Admitting: *Deleted

## 2022-06-07 NOTE — Telephone Encounter (Signed)
Patient given detailed instructions per Myocardial Perfusion Study Information Sheet for the test on  06/14/22 Patient notified to arrive 15 minutes early and that it is imperative to arrive on time for appointment to keep from having the test rescheduled.  If you need to cancel or reschedule your appointment, please call the office within 24 hours of your appointment. . Patient verbalized understanding. Adam Cruz

## 2022-06-14 ENCOUNTER — Other Ambulatory Visit: Payer: BC Managed Care – PPO

## 2022-06-14 ENCOUNTER — Ambulatory Visit (INDEPENDENT_AMBULATORY_CARE_PROVIDER_SITE_OTHER): Payer: BC Managed Care – PPO

## 2022-06-14 VITALS — Ht 72.0 in | Wt 202.0 lb

## 2022-06-14 DIAGNOSIS — I25118 Atherosclerotic heart disease of native coronary artery with other forms of angina pectoris: Secondary | ICD-10-CM

## 2022-06-14 DIAGNOSIS — R011 Cardiac murmur, unspecified: Secondary | ICD-10-CM

## 2022-06-14 DIAGNOSIS — E782 Mixed hyperlipidemia: Secondary | ICD-10-CM | POA: Diagnosis not present

## 2022-06-14 LAB — MYOCARDIAL PERFUSION IMAGING
Estimated workload: 6.9
Exercise duration (min): 5 min
LV dias vol: 60 mL (ref 62–150)
LV sys vol: 17 mL
MPHR: 160 {beats}/min
Nuc Stress EF: 72 %
Peak HR: 116 {beats}/min
Rest HR: 73 {beats}/min
Rest Nuclear Isotope Dose: 10.1 mCi
SDS: 0
SRS: 3
SSS: 3
Stress Nuclear Isotope Dose: 31.7 mCi
TID: 1.12

## 2022-06-14 LAB — ECHOCARDIOGRAM COMPLETE
Area-P 1/2: 4.36 cm2
Height: 72 in
S' Lateral: 2.8 cm
Weight: 3232 oz

## 2022-06-14 MED ORDER — PERFLUTREN LIPID MICROSPHERE
1.0000 mL | INTRAVENOUS | Status: AC | PRN
  Administered 2022-06-14: 3 mL via INTRAVENOUS

## 2022-06-14 MED ORDER — REGADENOSON 0.4 MG/5ML IV SOLN
0.4000 mg | Freq: Once | INTRAVENOUS | Status: AC
  Administered 2022-06-14: 0.4 mg via INTRAVENOUS

## 2022-06-14 MED ORDER — TECHNETIUM TC 99M TETROFOSMIN IV KIT
31.7000 | PACK | Freq: Once | INTRAVENOUS | Status: AC | PRN
  Administered 2022-06-14: 31.7 via INTRAVENOUS

## 2022-06-14 MED ORDER — TECHNETIUM TC 99M TETROFOSMIN IV KIT
10.1000 | PACK | Freq: Once | INTRAVENOUS | Status: AC | PRN
  Administered 2022-06-14: 10.1 via INTRAVENOUS

## 2022-06-15 ENCOUNTER — Telehealth: Payer: Self-pay | Admitting: Oncology

## 2022-06-15 NOTE — Telephone Encounter (Signed)
Called patient regarding upcoming August appointment, patient has been called and voicemail was left. 

## 2022-06-30 DIAGNOSIS — E113511 Type 2 diabetes mellitus with proliferative diabetic retinopathy with macular edema, right eye: Secondary | ICD-10-CM | POA: Diagnosis not present

## 2022-08-05 ENCOUNTER — Inpatient Hospital Stay (HOSPITAL_BASED_OUTPATIENT_CLINIC_OR_DEPARTMENT_OTHER): Payer: BC Managed Care – PPO | Admitting: Oncology

## 2022-08-05 ENCOUNTER — Inpatient Hospital Stay: Payer: BC Managed Care – PPO

## 2022-08-05 ENCOUNTER — Inpatient Hospital Stay: Payer: BC Managed Care – PPO | Attending: Oncology

## 2022-08-05 ENCOUNTER — Other Ambulatory Visit: Payer: Self-pay

## 2022-08-05 VITALS — BP 122/69 | HR 67 | Temp 97.7°F | Resp 15 | Wt 198.8 lb

## 2022-08-05 DIAGNOSIS — Z5111 Encounter for antineoplastic chemotherapy: Secondary | ICD-10-CM | POA: Diagnosis not present

## 2022-08-05 DIAGNOSIS — C61 Malignant neoplasm of prostate: Secondary | ICD-10-CM

## 2022-08-05 DIAGNOSIS — E1142 Type 2 diabetes mellitus with diabetic polyneuropathy: Secondary | ICD-10-CM | POA: Diagnosis not present

## 2022-08-05 DIAGNOSIS — I25118 Atherosclerotic heart disease of native coronary artery with other forms of angina pectoris: Secondary | ICD-10-CM | POA: Diagnosis not present

## 2022-08-05 DIAGNOSIS — E782 Mixed hyperlipidemia: Secondary | ICD-10-CM | POA: Diagnosis not present

## 2022-08-05 DIAGNOSIS — E039 Hypothyroidism, unspecified: Secondary | ICD-10-CM | POA: Diagnosis not present

## 2022-08-05 DIAGNOSIS — E113511 Type 2 diabetes mellitus with proliferative diabetic retinopathy with macular edema, right eye: Secondary | ICD-10-CM | POA: Diagnosis not present

## 2022-08-05 LAB — CBC WITH DIFFERENTIAL (CANCER CENTER ONLY)
Abs Immature Granulocytes: 0.03 10*3/uL (ref 0.00–0.07)
Basophils Absolute: 0.1 10*3/uL (ref 0.0–0.1)
Basophils Relative: 1 %
Eosinophils Absolute: 0.4 10*3/uL (ref 0.0–0.5)
Eosinophils Relative: 5 %
HCT: 39.2 % (ref 39.0–52.0)
Hemoglobin: 13.1 g/dL (ref 13.0–17.0)
Immature Granulocytes: 0 %
Lymphocytes Relative: 23 %
Lymphs Abs: 2.1 10*3/uL (ref 0.7–4.0)
MCH: 27.8 pg (ref 26.0–34.0)
MCHC: 33.4 g/dL (ref 30.0–36.0)
MCV: 83.2 fL (ref 80.0–100.0)
Monocytes Absolute: 0.9 10*3/uL (ref 0.1–1.0)
Monocytes Relative: 10 %
Neutro Abs: 5.7 10*3/uL (ref 1.7–7.7)
Neutrophils Relative %: 61 %
Platelet Count: 183 10*3/uL (ref 150–400)
RBC: 4.71 MIL/uL (ref 4.22–5.81)
RDW: 13.9 % (ref 11.5–15.5)
WBC Count: 9.3 10*3/uL (ref 4.0–10.5)
nRBC: 0 % (ref 0.0–0.2)

## 2022-08-05 LAB — CMP (CANCER CENTER ONLY)
ALT: 21 U/L (ref 0–44)
AST: 22 U/L (ref 15–41)
Albumin: 4 g/dL (ref 3.5–5.0)
Alkaline Phosphatase: 47 U/L (ref 38–126)
Anion gap: 8 (ref 5–15)
BUN: 21 mg/dL (ref 8–23)
CO2: 25 mmol/L (ref 22–32)
Calcium: 10.2 mg/dL (ref 8.9–10.3)
Chloride: 108 mmol/L (ref 98–111)
Creatinine: 1.12 mg/dL (ref 0.61–1.24)
GFR, Estimated: >60 mL/min (ref >60)
Glucose, Bld: 155 mg/dL — ABNORMAL HIGH (ref 70–99)
Potassium: 4 mmol/L (ref 3.5–5.1)
Sodium: 141 mmol/L (ref 135–145)
Total Bilirubin: 0.4 mg/dL (ref 0.3–1.2)
Total Protein: 7.2 g/dL (ref 6.5–8.1)

## 2022-08-05 MED ORDER — LEUPROLIDE ACETATE (4 MONTH) 30 MG ~~LOC~~ KIT
30.0000 mg | PACK | Freq: Once | SUBCUTANEOUS | Status: AC
  Administered 2022-08-05: 30 mg via SUBCUTANEOUS
  Filled 2022-08-05: qty 30

## 2022-08-05 NOTE — Progress Notes (Signed)
Hematology and Oncology Follow Up Visit  Adam Cruz [id number] January 29, 1961 61 y.o. 08/05/2022 12:55 PM Adam Cruz, MDAmin, Adam Lard, MD   Principle Diagnosis: 61 year old man with castration-sensitive advanced prostate cancer diagnosed in 2018.  He was found to have PSA of 68 and lymphadenopathy at the time of diagnosis.  Prior Therapy:  Androgen deprivation therapy started in July 2018.  Zytiga 1000 mg daily started in August 2018.  His dose was reduced to 750 mg daily started in March 2019.  Therapy discontinued in March 2020.  Current therapy:  Eligard 30 mg every 4 months.  Next injection will be given today.   Interim History: Adam Cruz returns today for repeat evaluation.  Since last visit, he reports no major changes in his health.  He continues to tolerate Eligard without any new issues.  He denies any nausea, vomiting or abdominal pain.  He denies any weight gain or hot flashes.  His performance status quality of life remains unchanged.              Medications: Reviewed without changes. Current Outpatient Medications  Medication Sig Dispense Refill   amitriptyline (ELAVIL) 25 MG tablet Take 25 mg by mouth at bedtime.     ASPIRIN 81 PO Take 81 mg by mouth at bedtime.   6   calcium carbonate (TUMS - DOSED IN MG ELEMENTAL CALCIUM) 500 MG chewable tablet Chew 1 tablet by mouth 3 (three) times daily as needed for indigestion or heartburn.     Cholecalciferol (VITAMIN D3) 5000 units CAPS Take 1 capsule by mouth daily.      clotrimazole-betamethasone (LOTRISONE) cream Apply 1 application. topically 2 (two) times daily.     Daridorexant HCl (QUVIVIQ) 50 MG TABS Take 50 mg by mouth at bedtime as needed (Sleep).     fluticasone (FLONASE) 50 MCG/ACT nasal spray Place 1 spray into both nostrils daily.     Glucagon (GVOKE HYPOPEN 2-PACK) 1 MG/0.2ML SOAJ Inject 0.2 mLs into the skin as directed. Inject 0.2 milliliters by subcutaneous route once in the abdomen, thigh, or upper  arm for low blood sugar. May repeat in 15 minutes if inadequate response for 30 days     levothyroxine (SYNTHROID) 100 MCG tablet Take 100 mcg by mouth 3 (three) times a week.     levothyroxine (SYNTHROID, LEVOTHROID) 112 MCG tablet Take 112 mcg by mouth as directed. Take on days pt is not taking the 100 mcg     nitroGLYCERIN (NITROSTAT) 0.4 MG SL tablet Place 1 tablet (0.4 mg total) under the tongue every 5 (five) minutes as needed. 25 tablet 6   NOVOLOG FLEXPEN 100 UNIT/ML FlexPen Inject 5 Units into the skin with breakfast, with lunch, and with evening meal.     omeprazole (PRILOSEC) 40 MG capsule Take 40 mg by mouth daily.      polyethylene glycol (MIRALAX / GLYCOLAX) 17 g packet Take 17 g by mouth daily.     pregabalin (LYRICA) 75 MG capsule Take 75 mg by mouth 3 (three) times daily.     rosuvastatin (CRESTOR) 40 MG tablet Take 40 mg by mouth daily.     sodium chloride (OCEAN) 0.65 % SOLN nasal spray Place 2 sprays into both nostrils daily as needed for congestion.     SYNJARDY XR 12.04-999 MG TB24 Take 2 tablets by mouth daily.     ticagrelor (BRILINTA) 90 MG TABS tablet Take 90 mg by mouth 2 (two) times daily.      TRESIBA FLEXTOUCH 100 UNIT/ML  FlexTouch Pen Inject 40 Units into the skin daily.     TRULICITY 4.5 ZD/6.3OV SOPN Inject 0.5 mLs into the skin once a week.     No current facility-administered medications for this visit.     Allergies: No Known Allergies    Physical Exam:         Blood pressure 122/69, pulse 67, temperature 97.7 F (36.5 C), temperature source Oral, resp. rate 15, weight 198 lb 12.8 oz (90.2 kg), SpO2 97 %.     ECOG: 1   General appearance: Comfortable appearing without any discomfort Head: Normocephalic without any trauma Oropharynx: Mucous membranes are moist and pink without any thrush or ulcers. Eyes: Pupils are equal and round reactive to light. Lymph nodes: No cervical, supraclavicular, inguinal or axillary lymphadenopathy.    Heart:regular rate and rhythm.  S1 and S2 without leg edema. Lung: Clear without any rhonchi or wheezes.  No dullness to percussion. Abdomin: Soft, nontender, nondistended with good bowel sounds.  No hepatosplenomegaly. Musculoskeletal: No joint deformity or effusion.  Full range of motion noted. Neurological: No deficits noted on motor, sensory and deep tendon reflex exam. Skin: No petechial rash or dryness.  Appeared moist.                Lab Results: Lab Results  Component Value Date   WBC 9.7 04/08/2022   HGB 12.3 (L) 04/08/2022   HCT 38.3 (L) 04/08/2022   MCV 85.5 04/08/2022   PLT 209 04/08/2022     Chemistry      Component Value Date/Time   NA 138 04/08/2022 1313   NA 140 11/21/2017 1444   K 4.4 04/08/2022 1313   K 3.6 11/21/2017 1444   CL 108 04/08/2022 1313   CO2 23 04/08/2022 1313   CO2 25 11/21/2017 1444   BUN 19 04/08/2022 1313   BUN 14.2 11/21/2017 1444   CREATININE 1.21 04/08/2022 1313   CREATININE 1.1 11/21/2017 1444      Component Value Date/Time   CALCIUM 9.7 04/08/2022 1313   CALCIUM 9.4 11/21/2017 1444   ALKPHOS 48 04/08/2022 1313   ALKPHOS 119 11/21/2017 1444   AST 20 04/08/2022 1313   AST 29 11/21/2017 1444   ALT 24 04/08/2022 1313   ALT 32 11/21/2017 1444   BILITOT 0.3 04/08/2022 1313   BILITOT 0.40 11/21/2017 1444           61 year old man with:    1.  Castration-sensitive advanced prostate cancer with adenopathy diagnosed in 2018.    The natural course of his disease was reviewed at this time and treatment options were discussed.  His PSA continues to be undetectable in April 2023.  Different salvage therapy options including androgen receptor pathway inhibitors, Taxotere chemotherapy as well as PARP inhibitors could be considered.  The plan is to update his staging scan next visit.  The plan is to continue with androgen deprivation therapy alone unless he has castration hyper resistant disease.  2. Androgen deprivation  therapy: He is currently on Eligard every 4 months and next injection will be given today.  Complications: We again flashes among others were reiterated.   3.  Hypertension: Resolved with blood pressure back to normal.   4. Follow-up: He will return in 4 months for a follow-up.   30 minutes were dedicated to this visit.  The time was spent on reviewing his disease status, treatment choices and future plan of care review.  Zola Button, MD 8/18/202312:55 PM

## 2022-08-06 LAB — PROSTATE-SPECIFIC AG, SERUM (LABCORP): Prostate Specific Ag, Serum: <0.1 ng/mL (ref 0.0–4.0)

## 2022-08-08 ENCOUNTER — Telehealth: Payer: Self-pay | Admitting: *Deleted

## 2022-08-08 NOTE — Telephone Encounter (Signed)
Notified of message below

## 2022-08-08 NOTE — Telephone Encounter (Signed)
-----   Message from Adam Portela, MD sent at 08/08/2022 10:12 AM EDT ----- Please let him know his PSA is still low

## 2022-08-25 DIAGNOSIS — E119 Type 2 diabetes mellitus without complications: Secondary | ICD-10-CM | POA: Diagnosis not present

## 2022-09-29 DIAGNOSIS — R040 Epistaxis: Secondary | ICD-10-CM | POA: Diagnosis not present

## 2022-09-29 DIAGNOSIS — Z7901 Long term (current) use of anticoagulants: Secondary | ICD-10-CM | POA: Diagnosis not present

## 2022-09-29 DIAGNOSIS — I10 Essential (primary) hypertension: Secondary | ICD-10-CM | POA: Diagnosis not present

## 2022-09-29 DIAGNOSIS — I2510 Atherosclerotic heart disease of native coronary artery without angina pectoris: Secondary | ICD-10-CM | POA: Diagnosis not present

## 2022-09-30 DIAGNOSIS — M79673 Pain in unspecified foot: Secondary | ICD-10-CM | POA: Diagnosis not present

## 2022-09-30 DIAGNOSIS — R040 Epistaxis: Secondary | ICD-10-CM | POA: Diagnosis not present

## 2022-10-06 DIAGNOSIS — Z7982 Long term (current) use of aspirin: Secondary | ICD-10-CM | POA: Diagnosis not present

## 2022-10-06 DIAGNOSIS — J342 Deviated nasal septum: Secondary | ICD-10-CM | POA: Diagnosis not present

## 2022-10-06 DIAGNOSIS — R040 Epistaxis: Secondary | ICD-10-CM | POA: Diagnosis not present

## 2022-10-06 DIAGNOSIS — J019 Acute sinusitis, unspecified: Secondary | ICD-10-CM | POA: Diagnosis not present

## 2022-11-02 ENCOUNTER — Ambulatory Visit: Payer: BC Managed Care – PPO | Admitting: Internal Medicine

## 2022-11-08 ENCOUNTER — Telehealth: Payer: Self-pay | Admitting: Oncology

## 2022-11-08 NOTE — Telephone Encounter (Signed)
Called patient regarding upcoming December appointments, patient is notified. 

## 2022-11-14 ENCOUNTER — Ambulatory Visit: Payer: BC Managed Care – PPO | Admitting: Internal Medicine

## 2022-11-16 ENCOUNTER — Telehealth: Payer: Self-pay | Admitting: *Deleted

## 2022-11-16 NOTE — Telephone Encounter (Signed)
PC to patient, informed him he has lab appointment on 11/25/22 at 3:45 pm. & CT appointment also on 11/25/22 at [facility] at 5:00 p.m.  He is to be NPO for 4 hours prior to scan, he is to drink 2 cups of water one hour prior.  Patient verbalizes understanding.

## 2022-11-21 DIAGNOSIS — Z01818 Encounter for other preprocedural examination: Secondary | ICD-10-CM | POA: Diagnosis not present

## 2022-11-22 ENCOUNTER — Telehealth: Payer: Self-pay

## 2022-11-22 NOTE — Telephone Encounter (Signed)
   Patient Name: Adam Cruz  DOB: 09/18/1961 MRN: [id number]  Primary Cardiologist: Jenean Lindau, MD  Chart reviewed as part of pre-operative protocol coverage. Patient requested for colonoscopy. Per Dr. Julien Nordmann note, has history of CAD with stenting many years ago. 2014 [facility] note outlines "NSTEMI 07/2013 with DES to mRCA and OM2, EF>55%."   We do not have cath report therefore need input from MD on whether OK to hold ASA/Brilinta as requested for colonoscopy. Dr. Geraldo Pitter also does not look to be the prescriber on the medicine (just listed as historical medicine on [facility] without prescriber name next to it) therefore will clarify with MD whether he should be seen in person given last OV 05/2022 with recommendation to f/u in 6 months versus tele visit. Dr. Geraldo Pitter - Please route response to P CV DIV PREOP (the pre-op pool). Thank you.   Charlie Pitter, PA-C 11/22/2022, 4:54 PM

## 2022-11-22 NOTE — Telephone Encounter (Signed)
..     Pre-operative Risk Assessment    Patient Name: Adam Cruz  DOB: 1961/08/30 MRN: [id number]      Request for Surgical Clearance    Procedure:   COLONOSCOPY  Date of Surgery:  Clearance TBD                                 Surgeon:  DR Jerel Shepherd Surgeon's Group or Practice Name:   Phone number:  [phone] Fax number:  [phone]   Type of Clearance Requested:   - Medical  - Pharmacy:  Hold Aspirin and Ticagrelor (Brilinta)     Type of Anesthesia:   PROPOFOL   Additional requests/questions:    Gwenlyn Found   11/22/2022, 2:30 PM

## 2022-11-23 NOTE — Telephone Encounter (Signed)
Will send a message to [facility] scheduling team to see if they can help schedule pre op appt for the pt.

## 2022-11-23 NOTE — Telephone Encounter (Signed)
   Name: BARNET BENAVIDES  DOB: 01/30/1961  MRN: [id number]  Primary Cardiologist: Jenean Lindau, MD  Chart reviewed as part of pre-operative protocol coverage. Because of Ayomide Purdy Loureiro's past medical history and time since last visit, he will require a follow-up in-office visit in order to better assess preoperative cardiovascular risk.  Pre-op covering staff: - Please schedule appointment and call patient to inform them. If patient already had an upcoming appointment within acceptable timeframe, please add "pre-op clearance" to the appointment notes so provider is aware. - Please contact requesting surgeon's office via preferred method (i.e, phone, fax) to inform them of need for appointment prior to surgery.   Lenna Sciara, NP  11/23/2022, 1:18 PM

## 2022-11-24 ENCOUNTER — Telehealth: Payer: Self-pay | Admitting: General Practice

## 2022-11-24 NOTE — Telephone Encounter (Signed)
LVM for pt to call and schedule pre op clearance appt/kbl

## 2022-11-24 NOTE — Telephone Encounter (Signed)
-----   Message from Michae Kava, [place] sent at 11/23/2022  5:50 PM EST ----- Regarding: PRE OP APPT HELP PLEASE :) Hi everyone,  I know you all hate seeing messages from me, sorry.   Can someone help and see where the pt can be scheduled.   Thank you  Arbie Cookey    Will send a message to [facility] scheduling team to see if they can help schedule pre op appt for the pt.

## 2022-11-25 ENCOUNTER — Other Ambulatory Visit: Payer: Self-pay

## 2022-11-25 ENCOUNTER — Ambulatory Visit (HOSPITAL_COMMUNITY)
Admission: RE | Admit: 2022-11-25 | Discharge: 2022-11-25 | Disposition: A | Discharge status: Home / Self Care | Payer: BC Managed Care – PPO | Source: Ambulatory Visit | Attending: Oncology | Admitting: Oncology

## 2022-11-25 ENCOUNTER — Other Ambulatory Visit: Payer: BC Managed Care – PPO

## 2022-11-25 ENCOUNTER — Inpatient Hospital Stay: Payer: BC Managed Care – PPO | Attending: Oncology

## 2022-11-25 DIAGNOSIS — R918 Other nonspecific abnormal finding of lung field: Secondary | ICD-10-CM | POA: Diagnosis not present

## 2022-11-25 DIAGNOSIS — C61 Malignant neoplasm of prostate: Secondary | ICD-10-CM | POA: Insufficient documentation

## 2022-11-25 DIAGNOSIS — Z79899 Other long term (current) drug therapy: Secondary | ICD-10-CM | POA: Diagnosis not present

## 2022-11-25 DIAGNOSIS — N3289 Other specified disorders of bladder: Secondary | ICD-10-CM | POA: Diagnosis not present

## 2022-11-25 DIAGNOSIS — Q625 Duplication of ureter: Secondary | ICD-10-CM | POA: Diagnosis not present

## 2022-11-25 DIAGNOSIS — Z5111 Encounter for antineoplastic chemotherapy: Secondary | ICD-10-CM | POA: Insufficient documentation

## 2022-11-25 DIAGNOSIS — I700 Atherosclerosis of aorta: Secondary | ICD-10-CM | POA: Diagnosis not present

## 2022-11-25 DIAGNOSIS — K449 Diaphragmatic hernia without obstruction or gangrene: Secondary | ICD-10-CM | POA: Diagnosis not present

## 2022-11-25 LAB — CBC WITH DIFFERENTIAL (CANCER CENTER ONLY)
Abs Immature Granulocytes: 0.03 10*3/uL (ref 0.00–0.07)
Basophils Absolute: 0.1 10*3/uL (ref 0.0–0.1)
Basophils Relative: 1 %
Eosinophils Absolute: 0.3 10*3/uL (ref 0.0–0.5)
Eosinophils Relative: 3 %
HCT: 38.1 % — ABNORMAL LOW (ref 39.0–52.0)
Hemoglobin: 12.3 g/dL — ABNORMAL LOW (ref 13.0–17.0)
Immature Granulocytes: 0 %
Lymphocytes Relative: 25 %
Lymphs Abs: 2.9 10*3/uL (ref 0.7–4.0)
MCH: 27.1 pg (ref 26.0–34.0)
MCHC: 32.3 g/dL (ref 30.0–36.0)
MCV: 83.9 fL (ref 80.0–100.0)
Monocytes Absolute: 1 10*3/uL (ref 0.1–1.0)
Monocytes Relative: 9 %
Neutro Abs: 7.3 10*3/uL (ref 1.7–7.7)
Neutrophils Relative %: 62 %
Platelet Count: 207 10*3/uL (ref 150–400)
RBC: 4.54 MIL/uL (ref 4.22–5.81)
RDW: 13.9 % (ref 11.5–15.5)
WBC Count: 11.7 10*3/uL — ABNORMAL HIGH (ref 4.0–10.5)
nRBC: 0 % (ref 0.0–0.2)

## 2022-11-25 LAB — CMP (CANCER CENTER ONLY)
ALT: 14 U/L (ref 0–44)
AST: 14 U/L — ABNORMAL LOW (ref 15–41)
Albumin: 4.1 g/dL (ref 3.5–5.0)
Alkaline Phosphatase: 57 U/L (ref 38–126)
Anion gap: 6 (ref 5–15)
BUN: 22 mg/dL (ref 8–23)
CO2: 27 mmol/L (ref 22–32)
Calcium: 10.2 mg/dL (ref 8.9–10.3)
Chloride: 105 mmol/L (ref 98–111)
Creatinine: 1.16 mg/dL (ref 0.61–1.24)
GFR, Estimated: >60 mL/min (ref >60)
Glucose, Bld: 240 mg/dL — ABNORMAL HIGH (ref 70–99)
Potassium: 4.4 mmol/L (ref 3.5–5.1)
Sodium: 138 mmol/L (ref 135–145)
Total Bilirubin: 0.3 mg/dL (ref 0.3–1.2)
Total Protein: 6.9 g/dL (ref 6.5–8.1)

## 2022-11-25 MED ORDER — IOHEXOL 300 MG/ML  SOLN
100.0000 mL | Freq: Once | INTRAMUSCULAR | Status: AC | PRN
  Administered 2022-11-25: 100 mL via INTRAVENOUS

## 2022-11-25 NOTE — Telephone Encounter (Signed)
Pt has appt 12/01/22 with Dr. Geraldo Pitter for pre op clearance.

## 2022-11-26 LAB — PROSTATE-SPECIFIC AG, SERUM (LABCORP): Prostate Specific Ag, Serum: <0.1 ng/mL (ref 0.0–4.0)

## 2022-11-30 ENCOUNTER — Encounter: Payer: Self-pay | Admitting: Oncology

## 2022-11-30 ENCOUNTER — Other Ambulatory Visit: Payer: Self-pay

## 2022-11-30 ENCOUNTER — Ambulatory Visit: Payer: BC Managed Care – PPO | Admitting: Internal Medicine

## 2022-11-30 ENCOUNTER — Encounter: Payer: Self-pay | Admitting: Internal Medicine

## 2022-11-30 VITALS — BP 124/86 | HR 80 | Temp 98.2°F | Resp 18 | Ht 72.0 in | Wt 201.1 lb

## 2022-11-30 DIAGNOSIS — Z794 Long term (current) use of insulin: Secondary | ICD-10-CM

## 2022-11-30 DIAGNOSIS — I25118 Atherosclerotic heart disease of native coronary artery with other forms of angina pectoris: Secondary | ICD-10-CM | POA: Diagnosis not present

## 2022-11-30 DIAGNOSIS — Z6827 Body mass index (BMI) 27.0-27.9, adult: Secondary | ICD-10-CM

## 2022-11-30 DIAGNOSIS — E039 Hypothyroidism, unspecified: Secondary | ICD-10-CM

## 2022-11-30 DIAGNOSIS — E113511 Type 2 diabetes mellitus with proliferative diabetic retinopathy with macular edema, right eye: Secondary | ICD-10-CM | POA: Diagnosis not present

## 2022-11-30 DIAGNOSIS — Z23 Encounter for immunization: Secondary | ICD-10-CM

## 2022-11-30 DIAGNOSIS — I10 Essential (primary) hypertension: Secondary | ICD-10-CM

## 2022-11-30 DIAGNOSIS — Z0000 Encounter for general adult medical examination without abnormal findings: Secondary | ICD-10-CM

## 2022-11-30 DIAGNOSIS — E782 Mixed hyperlipidemia: Secondary | ICD-10-CM

## 2022-11-30 DIAGNOSIS — C61 Malignant neoplasm of prostate: Secondary | ICD-10-CM

## 2022-11-30 MED ORDER — ROSUVASTATIN CALCIUM 40 MG PO TABS: 40.0000 mg | ORAL_TABLET | Freq: Every day | ORAL | 2 refills | Status: DC

## 2022-11-30 MED ORDER — SHINGRIX 50 MCG/0.5ML IM SUSR: 0.5000 mL | Freq: Once | INTRAMUSCULAR | 0 refills | Status: AC

## 2022-11-30 MED ORDER — LEVOTHYROXINE SODIUM 112 MCG PO TABS: 112.0000 ug | ORAL_TABLET | ORAL | 2 refills | Status: DC

## 2022-11-30 NOTE — Patient Instructions (Signed)
BP is target control so c/w current medications LDL is target control, continue with current medications along with diet and exercise Blood sugar is well controlled, need to watch diet, exercise and take medications

## 2022-11-30 NOTE — Progress Notes (Signed)
Established Patient Office Visit  Subjective   Patient ID: Adam Cruz, male    DOB: [birth year]  Age: 61 y.o. MRN: [id number]  Chief Complaint  Patient presents with   Annual Exam   Diabetes   Hyperlipidemia   Hypothyroidism    Diabetes  Hyperlipidemia   The patient is a 61 year old Caucasian/White male who has come to this office for his initial Annual physical examination. He has prostate cancer and followed with Oncologist Dr. [name] in [place] every 3 months. He has CT scan chest, abdomen and pelvis and is no evidence of metastatic disease.   Patient's BMI is 27 today. His waist circumference is 38 inches. His cognitive function is within normal limits.  The patient is experiencing the following health problems or issues: hypertension, hyperlipidemia, diabetes, coronary artery disease, hypothyroidism, and obesity His blood pressure has been well controlled on the patient's current medications. He check his blood pressures at home and stay around 120/68 and is off all medications. The patient's mixed lipid levels have been fairly well controlled on the patient's current medication regimen and are detailed in the laboratory reports. The patient has continuous glucose monitor. The patient has undergone a recent ophthalmology evaluation and retinal bleed right eye and is also follows with retina specialist. He was seen by ophthalmology every 6 months for diabetic proliferative retinopathy. His most recent HGB A1c was 8.2% in August 2023. and he has 1+ positive for protein. The patient has a history of stable coronary artery disease. He has not had a prior CABG, balloon angioplasty or PTCA/stent placement. His hypothyroidism has been stable on replacement therapy and inadequately controlled on the current dose of replacement therapy. The patient's weight has not changed significantly since the last visit.  He has no additional complaints.  He denies abdominal pain, difficulty  swallowing, low back pain, lower extremity edema, and paroxysmal nocturnal dyspnea.  The patient states he has been compliant with taking her medications as directed but has been having difficulty complying with the recommended levels of diet and exercise.   Patient does have multiple medical problems but he is not depress. Patient has been screened for depression and screening was negative.  The patient's functional ability is noted by this provider to be within normal limits. His level of safety is noted to be within normal limits.   There are no risk factors identified. Personalized health advice was given to the patient. See Plan for details. Advanced planning was discussed with patient and details are also noted in Plan.       He also has colonoscopy in 2020 and polyps were removed and next one is scheduled for December 21/ [facility] at [facility].  His depression screening is negative.    Review of Systems  Constitutional: Negative.   HENT: Negative.    Eyes: Negative.   Respiratory: Negative.    Cardiovascular: Negative.   Gastrointestinal: Negative.   Genitourinary: Negative.   Musculoskeletal:  Positive for joint pain.  Neurological: Negative.   Psychiatric/Behavioral:  Negative for depression.       Objective:     BP 124/86 (BP Location: Left Arm, Patient Position: Sitting)   Pulse 80   Temp 98.2 F (36.8 C)   Resp 18   Ht 6' (1.829 m)   Wt 201 lb 2 oz (91.2 kg)   SpO2 98%   BMI 27.28 kg/m   Physical Exam Constitutional:      Appearance: He is obese.  HENT:  Head: Normocephalic and atraumatic.  Eyes:     Extraocular Movements: Extraocular movements intact.     Pupils: Pupils are equal, round, and reactive to light.  Cardiovascular:     Rate and Rhythm: Normal rate and regular rhythm.     Heart sounds: Normal heart sounds.  Pulmonary:     Effort: Pulmonary effort is normal.     Breath sounds: Normal breath sounds.  Abdominal:     General: Bowel sounds are  normal.     Palpations: Abdomen is soft.  Musculoskeletal:        General: Normal range of motion.  Neurological:     General: No focal deficit present.     Mental Status: He is alert and oriented to person, place, and time.  Psychiatric:        Mood and Affect: Mood normal.        Behavior: Behavior normal.      No results found for any visits on 11/30/22.   The ASCVD Risk score (Arnett DK, et al., 2019) failed to calculate for the following reasons:   The patient has a prior MI or stroke diagnosis    Assessment & Plan:   Problem List Items Addressed This Visit       Cardiovascular and Mediastinum   Coronary artery disease of native artery of native heart with stable angina pectoris ([place]) - Primary   Hypertension     Endocrine   Hypothyroidism   Type 2 diabetes mellitus with right eye affected by proliferative retinopathy and macular edema, with long-term current use of insulin ([place])     Genitourinary   Malignant neoplasm of prostate ([place])     Other   Mixed hyperlipidemia   Need for prophylactic vaccination and inoculation against influenza    Return in about 3 months (around 03/01/2023).    [name], MD

## 2022-12-01 ENCOUNTER — Ambulatory Visit: Payer: BC Managed Care – PPO | Attending: Cardiology | Admitting: Cardiology

## 2022-12-01 ENCOUNTER — Encounter: Payer: Self-pay | Admitting: Cardiology

## 2022-12-01 VITALS — BP 132/80 | HR 76 | Ht 72.0 in | Wt 201.4 lb

## 2022-12-01 DIAGNOSIS — E113511 Type 2 diabetes mellitus with proliferative diabetic retinopathy with macular edema, right eye: Secondary | ICD-10-CM | POA: Diagnosis not present

## 2022-12-01 DIAGNOSIS — E1100 Type 2 diabetes mellitus with hyperosmolarity without nonketotic hyperglycemic-hyperosmolar coma (NKHHC): Secondary | ICD-10-CM

## 2022-12-01 DIAGNOSIS — E0800 Diabetes mellitus due to underlying condition with hyperosmolarity without nonketotic hyperglycemic-hyperosmolar coma (NKHHC): Secondary | ICD-10-CM | POA: Diagnosis not present

## 2022-12-01 DIAGNOSIS — I4510 Unspecified right bundle-branch block: Secondary | ICD-10-CM

## 2022-12-01 DIAGNOSIS — E782 Mixed hyperlipidemia: Secondary | ICD-10-CM

## 2022-12-01 DIAGNOSIS — I25118 Atherosclerotic heart disease of native coronary artery with other forms of angina pectoris: Secondary | ICD-10-CM | POA: Diagnosis not present

## 2022-12-01 DIAGNOSIS — Z794 Long term (current) use of insulin: Secondary | ICD-10-CM

## 2022-12-01 LAB — CMP14 + ANION GAP
ALT: 17 IU/L (ref 0–44)
AST: 15 IU/L (ref 0–40)
Albumin/Globulin Ratio: 1.7 (ref 1.2–2.2)
Albumin: 4.3 g/dL (ref 3.9–4.9)
Alkaline Phosphatase: 68 IU/L (ref 44–121)
Anion Gap: 16 mmol/L (ref 10.0–18.0)
BUN/Creatinine Ratio: 17 (ref 10–24)
BUN: 21 mg/dL (ref 8–27)
Bilirubin Total: 0.3 mg/dL (ref 0.0–1.2)
CO2: 20 mmol/L (ref 20–29)
Calcium: 10.2 mg/dL (ref 8.6–10.2)
Chloride: 103 mmol/L (ref 96–106)
Creatinine, Ser: 1.24 mg/dL (ref 0.76–1.27)
Globulin, Total: 2.5 g/dL (ref 1.5–4.5)
Glucose: 134 mg/dL — ABNORMAL HIGH (ref 70–99)
Potassium: 4.3 mmol/L (ref 3.5–5.2)
Sodium: 139 mmol/L (ref 134–144)
Total Protein: 6.8 g/dL (ref 6.0–8.5)
eGFR: 66 mL/min/{1.73_m2} (ref >59)

## 2022-12-01 LAB — HEMOGLOBIN A1C
Est. average glucose Bld gHb Est-mCnc: 214 mg/dL
Hgb A1c MFr Bld: 9.1 % — ABNORMAL HIGH (ref 4.8–5.6)

## 2022-12-01 LAB — MICROALBUMIN / CREATININE URINE RATIO
Creatinine, Urine: 29.4 mg/dL
Microalb/Creat Ratio: 247 mg/g creat — ABNORMAL HIGH (ref 0–29)
Microalbumin, Urine: 72.5 ug/mL

## 2022-12-01 LAB — LIPID PANEL
Chol/HDL Ratio: 2.8 ratio (ref 0.0–5.0)
Cholesterol, Total: 118 mg/dL (ref 100–199)
HDL: 42 mg/dL (ref >39)
LDL Chol Calc (NIH): 43 mg/dL (ref 0–99)
Triglycerides: 210 mg/dL — ABNORMAL HIGH (ref 0–149)
VLDL Cholesterol Cal: 33 mg/dL (ref 5–40)

## 2022-12-01 LAB — TSH: TSH: 0.204 u[IU]/mL — ABNORMAL LOW (ref 0.450–4.500)
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
## 2022-12-01 NOTE — Patient Instructions (Signed)
Medication Instructions:  Your physician has recommended you make the following change in your medication:   Stop Brilinta  *If you need a refill on your cardiac medications before your next appointment, please call your pharmacy*   Lab Work: None ordered If you have labs (blood work) drawn today and your tests are completely normal, you will receive your results only by: [place] (if you have MyChart) OR A paper copy in the mail If you have any lab test that is abnormal or we need to change your treatment, we will call you to review the results.   Testing/Procedures: None ordered   Follow-Up: At [facility], you and your health needs are our priority.  As part of our continuing mission to provide you with exceptional heart care, we have created designated Provider Care Teams.  These Care Teams include your primary Cardiologist (physician) and Advanced Practice Providers (APPs -  Physician Assistants and Nurse Practitioners) who all work together to provide you with the care you need, when you need it.  We recommend signing up for the patient portal called "MyChart".  Sign up information is provided on this After Visit Summary.  MyChart is used to connect with patients for Virtual Visits (Telemedicine).  Patients are able to view lab/test results, encounter notes, upcoming appointments, etc.  Non-urgent messages can be sent to your provider as well.   To learn more about what you can do with MyChart, go to [email or web address].    Your next appointment:   9 month(s)  The format for your next appointment:   In Person  Provider:   Jyl Heinz, MD    Other Instructions none  Important Information About Sugar

## 2022-12-02 ENCOUNTER — Inpatient Hospital Stay (HOSPITAL_BASED_OUTPATIENT_CLINIC_OR_DEPARTMENT_OTHER): Payer: BC Managed Care – PPO | Admitting: Oncology

## 2022-12-02 ENCOUNTER — Inpatient Hospital Stay: Payer: BC Managed Care – PPO

## 2022-12-02 VITALS — BP 129/73 | HR 82 | Temp 97.9°F | Resp 17 | Wt 201.4 lb

## 2022-12-02 DIAGNOSIS — C61 Malignant neoplasm of prostate: Secondary | ICD-10-CM

## 2022-12-02 DIAGNOSIS — Z5111 Encounter for antineoplastic chemotherapy: Secondary | ICD-10-CM | POA: Diagnosis not present

## 2022-12-02 DIAGNOSIS — Z79899 Other long term (current) drug therapy: Secondary | ICD-10-CM | POA: Diagnosis not present

## 2022-12-02 MED ORDER — LEUPROLIDE ACETATE (4 MONTH) 30 MG ~~LOC~~ KIT
30.0000 mg | PACK | Freq: Once | SUBCUTANEOUS | Status: AC
  Administered 2022-12-02: 30 mg via SUBCUTANEOUS
  Filled 2022-12-02: qty 30

## 2022-12-02 NOTE — Progress Notes (Signed)
Hematology and Oncology Follow Up Visit  Adam Cruz [id number] 1961/03/26 61 y.o. 12/02/2022 3:09 PM Adam Cruz, Adam Cruz, Adam Lard, MD   Principle Diagnosis: 61 year old man with advanced prostate cancer with lymphadenopathy diagnosed in 2018.  He has castration-sensitive after presenting with PSA of 68.  Prior Therapy:  Androgen deprivation therapy started in July 2018.  Zytiga 1000 mg daily started in August 2018.  His dose was reduced to 750 mg daily started in March 2019.  Therapy discontinued in March 2020.  Current therapy:  Eligard 30 mg every 4 months.  This will be repeated today and continued indefinitely.   Interim History: [name] presents today for a follow-up evaluation.  Since the last visit, he reports of feeling well without any major complaints.  He denies any nausea, vomiting or abdominal pain.  He denies any hospitalizations or illnesses.  He denies any bone pain or pathological fractures.  His performance status remains reasonable without any decline.              Medications: Updated on review. Current Outpatient Medications  Medication Sig Dispense Refill   amitriptyline (ELAVIL) 25 MG tablet Take 25 mg by mouth at bedtime.     ASPIRIN 81 PO Take 81 mg by mouth at bedtime.   6   Cholecalciferol (VITAMIN D3) 5000 units CAPS Take 1 capsule by mouth daily.      levothyroxine (SYNTHROID) 112 MCG tablet Take 1 tablet (112 mcg total) by mouth as directed. Take on days pt is not taking the 100 mcg 90 tablet 2   nitroGLYCERIN (NITROSTAT) 0.4 MG SL tablet Place 0.4 mg under the tongue every 5 (five) minutes as needed for chest pain.     NOVOLOG FLEXPEN 100 UNIT/ML FlexPen Inject 5 Units into the skin with breakfast, with lunch, and with evening meal.     omeprazole (PRILOSEC) 40 MG capsule Take 40 mg by mouth daily.      pregabalin (LYRICA) 75 MG capsule Take 75 mg by mouth 3 (three) times daily.     rosuvastatin (CRESTOR) 40 MG tablet Take 1 tablet (40  mg total) by mouth daily. 90 tablet 2   SYNJARDY XR 12.04-999 MG TB24 Take 2 tablets by mouth daily.     TRESIBA FLEXTOUCH 100 UNIT/ML FlexTouch Pen Inject 40 Units into the skin daily.     TRULICITY 4.5 QB/1.6XI SOPN Inject 0.5 mLs into the skin once a week.     No current facility-administered medications for this visit.     Allergies: No Known Allergies    Physical Exam:  Blood pressure 129/73, pulse 82, temperature 97.9 F (36.6 C), temperature source Temporal, resp. rate 17, weight 201 lb 6.4 oz (91.4 kg), SpO2 96 %.    ECOG: 1    General appearance: Alert, awake without any distress. Head: Atraumatic without abnormalities Oropharynx: Without any thrush or ulcers. Eyes: No scleral icterus. Lymph nodes: No lymphadenopathy noted in the cervical, supraclavicular, or axillary nodes Heart:regular rate and rhythm, without any murmurs or gallops.   Lung: Clear to auscultation without any rhonchi, wheezes or dullness to percussion. Abdomin: Soft, nontender without any shifting dullness or ascites. Musculoskeletal: No clubbing or cyanosis. Neurological: No motor or sensory deficits. Skin: No rashes or lesions.                Lab Results: Lab Results  Component Value Date   WBC 11.7 (H) 11/25/2022   HGB 12.3 (L) 11/25/2022   HCT 38.1 (L) 11/25/2022  MCV 83.9 11/25/2022   PLT 207 11/25/2022     Chemistry      Component Value Date/Time   NA 139 11/30/2022 1144   NA 140 11/21/2017 1444   K 4.3 11/30/2022 1144   K 3.6 11/21/2017 1444   CL 103 11/30/2022 1144   CO2 20 11/30/2022 1144   CO2 25 11/21/2017 1444   BUN 21 11/30/2022 1144   BUN 14.2 11/21/2017 1444   CREATININE 1.24 11/30/2022 1144   CREATININE 1.16 11/25/2022 1531   CREATININE 1.1 11/21/2017 1444      Component Value Date/Time   CALCIUM 10.2 11/30/2022 1144   CALCIUM 9.4 11/21/2017 1444   ALKPHOS 68 11/30/2022 1144   ALKPHOS 119 11/21/2017 1444   AST 15 11/30/2022 1144   AST 14 (L)  11/25/2022 1531   AST 29 11/21/2017 1444   ALT 17 11/30/2022 1144   ALT 14 11/25/2022 1531   ALT 32 11/21/2017 1444   BILITOT 0.3 11/30/2022 1144   BILITOT 0.3 11/25/2022 1531   BILITOT 0.40 11/21/2017 1444       Latest Reference Range & Units 04/08/22 13:13 08/05/22 13:12 11/25/22 15:31  Prostate Specific Ag, Serum 0.0 - 4.0 ng/mL <0.1 <0.1 <0.1     IMPRESSION: 1. Stable CTS of the chest, abdomen and pelvis. No evidence of local recurrence or metastatic disease. 2. Stable small pulmonary nodules at both lung bases, consistent with benign findings. 3. Stable postsurgical changes in the abdomen. 4. Stable small hiatal hernia. 5.  Aortic Atherosclerosis (ICD10-I70.0).    61 year old man with:    1.  Advanced prostate cancer with lymphadenopathy diagnosed in 2018.  He has castration-sensitive disease at this time.  He continues to have undetectable PSA with excellent disease control and no need for any additional treatment.  Risks and benefits of therapy escalation was reiterated at this time.  He received his Zytiga for close to 2 years and opted against any additional therapy.  CT scan obtained on November 25, 2022 was personally reviewed and showed no evidence of the disease.  Different salvage therapy options including androgen receptor pathway inhibitor such as enzalutamide, apalutamide versus PARP inhibitor or systemic chemotherapy.  These will be deferred unless he has metastatic disease in the future.  2. Androgen deprivation therapy: Risks and benefits of continuing Eligard were discussed.  This is to be continued indefinitely.  Complications including weight gain, hot flashes and sexual dysfunction were reiterated.   3.  Hypertension: Related to Zytiga and has resolved at this time.   4. Follow-up: In 4 months for repeat follow-up.  He will follow-up at that [place] [facility] [place]   30 minutes were spent on this visit.  The time was dedicated to  updating disease status, treatment choices and outlining future plan of care review.  Zola Button, MD 12/15/20233:09 PM

## 2022-12-05 NOTE — Progress Notes (Signed)
Per pt he can't make any changes until after his colonoscopy on Thursday.

## 2022-12-08 DIAGNOSIS — Z09 Encounter for follow-up examination after completed treatment for conditions other than malignant neoplasm: Secondary | ICD-10-CM | POA: Diagnosis not present

## 2022-12-08 DIAGNOSIS — Z86010 Personal history of colonic polyps: Secondary | ICD-10-CM | POA: Diagnosis not present

## 2022-12-08 DIAGNOSIS — Z1211 Encounter for screening for malignant neoplasm of colon: Secondary | ICD-10-CM | POA: Diagnosis not present

## 2022-12-26 ENCOUNTER — Other Ambulatory Visit: Payer: Self-pay

## 2022-12-26 MED ORDER — SYNJARDY XR 12.5-1000 MG PO TB24: 2.0000 | ORAL_TABLET | Freq: Every day | ORAL | 1 refills | Status: DC

## 2022-12-27 ENCOUNTER — Other Ambulatory Visit: Payer: Self-pay

## 2022-12-27 MED ORDER — SYNJARDY XR 12.5-1000 MG PO TB24: 2.0000 | ORAL_TABLET | Freq: Every day | ORAL | 1 refills | Status: DC

## 2023-01-05 DIAGNOSIS — E113511 Type 2 diabetes mellitus with proliferative diabetic retinopathy with macular edema, right eye: Secondary | ICD-10-CM | POA: Diagnosis not present

## 2023-01-06 ENCOUNTER — Telehealth: Payer: Self-pay | Admitting: Oncology

## 2023-01-06 NOTE — Telephone Encounter (Signed)
Contacted pt to schedule an appt. Unable to reach via phone, voicemail was left.    Message Received: Today Adam Cruz, [name], [name] Hi!  This is another Dr. [name] pt that will need to f/u with either Dr. Bobby Rumpf or Dr. Hinton Rao. They will need labs, f/u, and injection in 3 months. If you could please call to set this up.  Thank you!

## 2023-01-13 ENCOUNTER — Other Ambulatory Visit: Payer: Self-pay

## 2023-01-13 ENCOUNTER — Other Ambulatory Visit: Payer: Self-pay | Admitting: Internal Medicine

## 2023-01-13 MED ORDER — PREGABALIN 75 MG PO CAPS: 75.0000 mg | ORAL_CAPSULE | Freq: Three times a day (TID) | ORAL | 2 refills | Status: DC

## 2023-02-27 ENCOUNTER — Ambulatory Visit: Payer: BC Managed Care – PPO | Admitting: Internal Medicine

## 2023-03-02 ENCOUNTER — Other Ambulatory Visit: Payer: Self-pay | Admitting: Internal Medicine

## 2023-03-03 ENCOUNTER — Encounter: Payer: Self-pay | Admitting: Oncology

## 2023-03-06 ENCOUNTER — Other Ambulatory Visit: Payer: BC Managed Care – PPO

## 2023-03-06 ENCOUNTER — Ambulatory Visit: Payer: BC Managed Care – PPO | Admitting: Oncology

## 2023-03-06 ENCOUNTER — Ambulatory Visit: Payer: BC Managed Care – PPO

## 2023-03-09 ENCOUNTER — Other Ambulatory Visit: Payer: Self-pay | Admitting: Oncology

## 2023-03-09 ENCOUNTER — Other Ambulatory Visit: Payer: Self-pay | Admitting: Hematology and Oncology

## 2023-03-09 ENCOUNTER — Inpatient Hospital Stay (INDEPENDENT_AMBULATORY_CARE_PROVIDER_SITE_OTHER): Payer: BC Managed Care – PPO | Admitting: Oncology

## 2023-03-09 ENCOUNTER — Encounter: Payer: Self-pay | Admitting: Oncology

## 2023-03-09 ENCOUNTER — Inpatient Hospital Stay: Payer: BC Managed Care – PPO

## 2023-03-09 ENCOUNTER — Inpatient Hospital Stay: Payer: BC Managed Care – PPO | Attending: Oncology

## 2023-03-09 ENCOUNTER — Telehealth: Payer: Self-pay | Admitting: Oncology

## 2023-03-09 VITALS — BP 125/79 | HR 68 | Temp 97.8°F | Resp 16 | Ht 72.0 in | Wt 205.1 lb

## 2023-03-09 DIAGNOSIS — Z1509 Genetic susceptibility to other malignant neoplasm: Secondary | ICD-10-CM

## 2023-03-09 DIAGNOSIS — C61 Malignant neoplasm of prostate: Secondary | ICD-10-CM | POA: Diagnosis not present

## 2023-03-09 DIAGNOSIS — Z1501 Genetic susceptibility to malignant neoplasm of breast: Secondary | ICD-10-CM

## 2023-03-09 LAB — CMP (CANCER CENTER ONLY)
ALT: 36 U/L (ref 0–44)
AST: 32 U/L (ref 15–41)
Albumin: 4.5 g/dL (ref 3.5–5.0)
Alkaline Phosphatase: 49 U/L (ref 38–126)
Anion gap: 8 (ref 5–15)
BUN: 24 mg/dL — ABNORMAL HIGH (ref 8–23)
CO2: 26 mmol/L (ref 22–32)
Calcium: 9.6 mg/dL (ref 8.9–10.3)
Chloride: 102 mmol/L (ref 98–111)
Creatinine: 1.41 mg/dL — ABNORMAL HIGH (ref 0.61–1.24)
GFR, Estimated: 57 mL/min — ABNORMAL LOW (ref >60)
Glucose, Bld: 224 mg/dL — ABNORMAL HIGH (ref 70–99)
Potassium: 4.3 mmol/L (ref 3.5–5.1)
Sodium: 136 mmol/L (ref 135–145)
Total Bilirubin: 0.6 mg/dL (ref 0.3–1.2)
Total Protein: 7.7 g/dL (ref 6.5–8.1)

## 2023-03-09 LAB — CBC WITH DIFFERENTIAL (CANCER CENTER ONLY)
Abs Immature Granulocytes: 0.01 10*3/uL (ref 0.00–0.07)
Basophils Absolute: 0.1 10*3/uL (ref 0.0–0.1)
Basophils Relative: 1 %
Eosinophils Absolute: 0.4 10*3/uL (ref 0.0–0.5)
Eosinophils Relative: 5 %
HCT: 41.1 % (ref 39.0–52.0)
Hemoglobin: 12.7 g/dL — ABNORMAL LOW (ref 13.0–17.0)
Immature Granulocytes: 0 %
Lymphocytes Relative: 27 %
Lymphs Abs: 2.4 10*3/uL (ref 0.7–4.0)
MCH: 26.4 pg (ref 26.0–34.0)
MCHC: 30.9 g/dL (ref 30.0–36.0)
MCV: 85.4 fL (ref 80.0–100.0)
Monocytes Absolute: 0.7 10*3/uL (ref 0.1–1.0)
Monocytes Relative: 7 %
Neutro Abs: 5.2 10*3/uL (ref 1.7–7.7)
Neutrophils Relative %: 60 %
Platelet Count: 172 10*3/uL (ref 150–400)
RBC: 4.81 MIL/uL (ref 4.22–5.81)
RDW: 16.8 % — ABNORMAL HIGH (ref 11.5–15.5)
WBC Count: 8.8 10*3/uL (ref 4.0–10.5)
nRBC: 0 % (ref 0.0–0.2)

## 2023-03-09 LAB — PSA: Prostatic Specific Antigen: <0.01 ng/mL (ref 0.00–4.00)

## 2023-03-09 NOTE — Progress Notes (Signed)
[facility]  [address] [place],  [place]  [address] [phone]  Clinic Day:  03/09/23  Referring physician: Eloisa Northern, MD   ASSESSMENT & PLAN:  1.  Advanced prostate cancer with lymphadenopathy diagnosed in 2018.  He has castration-sensitive disease at this time. He received his Zytiga for close to 2 years with severe toxicities and opted against any additional therapy.  CT scan obtained on November 25, 2022 was personally reviewed and showed no evidence of the disease.   2. Androgen deprivation therapy: With Eligard injections every 4 months. His PSA has remained less than 0.1.  This is to be continued indefinitely.    3.  Hypertension: Related to Zytiga and has resolved at this time.    Plan: He is scheduled to get his next Eligard injection in April. I will plan to see him back in 5 months prior to his next Eligard injection. If all is well we will wait until December to repeat his CT scans. I discussed the assessment and treatment plan with the patient.  The patient was provided an opportunity to ask questions and all were answered.  The patient agreed with the plan and demonstrated an understanding of the instructions.  The patient was advised to call back if the symptoms worsen or if the condition fails to improve as anticipated.  Thank you for the opportunity for taking part of the care in your patients.   I provided 45 minutes of face-to-face time during this this encounter and > 50% was spent counseling as documented under my assessment and plan.   Dellia Beckwith, MD [facility] [address] [place] [address] Dept: [phone] Dept Fax: [phone]     CHIEF COMPLAINT:  CC: Prostate cancer with lymphadenopathy  Current Treatment:     HISTORY OF PRESENT ILLNESS:  Adam Cruz, or also known as Adam Cruz, is a 62 year old man with advanced prostate cancer  with lymphadenopathy diagnosed in 2018. He has castration-sensitive disease presenting with a PSA of 68. He was placed on androgen deprivation in July of 2018 after biopsies confirmed adenocarcinoma. He was seen by Dr.Shadad in August of 2018 and placed on Zytiga 1000 mg daily, this was reduced to 750 mg in March of 2019. He suffered significant toxicities including severe hypertension affecting his vision and difficulty with balance and ambulation. It was eventually stopped in March of 2020. He continues on Eliguard injections every 4 months with the last one given 12/02/22. He had genetic testing done which revealed he is positive for BRCA2. CT scans have been done every 6 months with the last one in December of 2023, and remains stable. He has stable small pulmonary nodules at both lung bases. His PSA has remained less that 0.1.    Oncology History  Malignant neoplasm of prostate  07/28/2017 Initial Diagnosis   Malignant neoplasm of prostate (HCC)   09/08/2017 Genetic Testing   BRCA2 c.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  The Hereditary Gene Panel offered by [facility] includes sequencing and/or deletion duplication testing of the following 46 genes: APC, ATM, AXIN2, BARD1, BMPR1A, BRCA1, BRCA2, BRIP1, CDH1, CDKN2A (p14ARF), CDKN2A (p16INK4a), CHEK2, CTNNA1, DICER1, EPCAM (Deletion/duplication testing only), GREM1 (promoter region deletion/duplication testing only), KIT, MEN1, MLH1, MSH2, MSH3, MSH6, MUTYH, NBN, NF1, NHTL1, PALB2, PDGFRA, PMS2, POLD1, POLE, PTEN, RAD50, RAD51C, RAD51D, SDHB, SDHC, SDHD, SMAD4, SMARCA4. STK11, TP53, TSC1, TSC2, and VHL.  The following genes were evaluated for sequence changes only: SDHA and HOXB13 c.251G>A variant only.  The report date is September 08, 2017.    07/28/2021 Cancer Staging   Staging form: Prostate, AJCC 8th Edition - Clinical: Stage IVB (cTX, cNX, pM1a) - Signed by Benjiman Core, MD on  07/28/2021      INTERVAL HISTORY:  Adam Cruz is seen here today to be transferred to our care. He was on Zytiga 1000 mg for two years (started in August of 2018), and was later reduced to 750 mg in March of 2019. 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 But this has improved since being off off the Zytiga and controlling his blood pressure. He continues to receive Eliguard injections every 4 months. His next injection will be in April. He takes Lyrica 75 mg to control his neuropathy. He had genetic testing done which revealed he is positive for BRCA2. He notes that he has some trouble starting his urine stream. He states he has pain in his right hip. He sees Dr.Amin every 3 months for follow ups. His colonoscopy was done by Dr.Lininger in December of 2023. He was not able to complete this one and will repeat next year. His last creatinine was elevated at 1.24 and calcium slightly elevated at 10.2.  He denies fever, chills, night sweats, or other signs of infection. He denies cardiorespiratory and gastrointestinal issues. He  denies pain. His appetite is good. His weight is at 205 today. He does complain of frequent falls.   FAMILY HISTORY Mother (Pancreatic Cancer) Grandfather (Breast cancer and bone cancer) Daughter (Breast Cancer)  SOCIAL HISTORY Married Works from home  2 Biological children and 3 step children Does not smoke Does not drink No chemical exposures  PAST MEDICAL HISTORY Diabetes with diabetic retinopathy and diabetic neuropathy Hyperlipidemia Hypothyroidism (on medication) Heart Attack in 2014 (2 Stents) Colon polyps including a tubular adenoma greater than 1 cm GERD with hiatal hernia  PAST SURGICAL HISTORY Appendectomy Meckel's diverticulum Cataract Surgery Surgery on Shoulder Retinal detachment  Cardiac catheterization   REVIEW OF SYSTEMS:  Review of Systems  Constitutional: Negative.   HENT:  Negative.    Eyes: Negative.    Respiratory: Negative.    Cardiovascular: Negative.   Gastrointestinal: Negative.   Endocrine: Negative.   Genitourinary: Negative.    Musculoskeletal: Negative.   Skin: Negative.   Neurological: Negative.   Hematological: Negative.   Psychiatric/Behavioral: Negative.       VITALS:  Blood pressure 125/79, pulse 68, temperature 97.8 F (36.6 C), temperature source Oral, resp. rate 16, height 6' (1.829 m), weight 205 lb 1.6 oz (93 kg), SpO2 100 %.  Wt Readings from Last 3 Encounters:  03/13/23 203 lb 4 oz (92.2 kg)  03/09/23 205 lb 1.6 oz (93 kg)  12/02/22 201 lb 6.4 oz (91.4 kg)    Body mass index is 27.82 kg/m.  Performance status (ECOG): 1 - Symptomatic but completely ambulatory  PHYSICAL EXAM:  Physical Exam Constitutional:      General: He is not in acute distress.    Appearance: Normal appearance. He is normal weight. He is not ill-appearing or toxic-appearing.  HENT:     Head: Normocephalic and atraumatic.     Right Ear: Tympanic membrane normal. There is no impacted cerumen.     Left Ear: Tympanic membrane normal. There is no impacted cerumen.     Nose: Nose normal.  Eyes:     General:  No scleral icterus.    Extraocular Movements: Extraocular movements intact.     Conjunctiva/sclera: Conjunctivae normal.     Pupils: Pupils are equal, round, and reactive to light.  Cardiovascular:     Rate and Rhythm: Normal rate and regular rhythm.     Pulses: Normal pulses.     Heart sounds: Normal heart sounds. No murmur heard.    No gallop.  Pulmonary:     Effort: Pulmonary effort is normal. No respiratory distress.     Breath sounds: Normal breath sounds. No wheezing or rales.  Abdominal:     General: Bowel sounds are normal.  Musculoskeletal:        General: Normal range of motion.     Cervical back: Normal range of motion and neck supple.     Right lower leg: No edema.     Left lower leg: No edema.  Skin:    General: Skin is warm.  Neurological:     Mental  Status: He is alert and oriented to person, place, and time.  Psychiatric:        Mood and Affect: Mood normal.        Behavior: Behavior normal.        Thought Content: Thought content normal.        Judgment: Judgment normal.     LABS:      Latest Ref Rng & Units 03/09/2023    9:13 AM 11/25/2022    3:31 PM 08/05/2022    1:12 PM  CBC  WBC 4.0 - 10.5 K/uL 8.8  11.7  9.3   Hemoglobin 13.0 - 17.0 g/dL 89.1  69.4  50.3   Hematocrit 39.0 - 52.0 % 41.1  38.1  39.2   Platelets 150 - 400 K/uL 172  207  183       Latest Ref Rng & Units 03/09/2023    9:13 AM 11/30/2022   11:44 AM 11/25/2022    3:31 PM  CMP  Glucose 70 - 99 mg/dL 888  280  034   BUN 8 - 23 mg/dL 24  21  22    Creatinine 0.61 - 1.24 mg/dL 9.17  9.15  0.56   Sodium 135 - 145 mmol/L 136  139  138   Potassium 3.5 - 5.1 mmol/L 4.3  4.3  4.4   Chloride 98 - 111 mmol/L 102  103  105   CO2 22 - 32 mmol/L 26  20  27    Calcium 8.9 - 10.3 mg/dL 9.6  97.9  48.0   Total Protein 6.5 - 8.1 g/dL 7.7  6.8  6.9   Total Bilirubin 0.3 - 1.2 mg/dL 0.6  0.3  0.3   Alkaline Phos 38 - 126 U/L 49  68  57   AST 15 - 41 U/L 32  15  14   ALT 0 - 44 U/L 36  17  14      No results found for: "CEA1", "CEA" / No results found for: "CEA1", "CEA" Lab Results  Component Value Date   PSA1 <0.1 11/25/2022   No results found for: "XKP537" No results found for: "SMO707"  Lab Results  Component Value Date   ALBUMINELP 3.6 (L) 11/29/2017   A1GS 0.3 11/29/2017   A2GS 0.9 11/29/2017   BETS 0.4 11/29/2017   BETA2SER 0.4 11/29/2017   GAMS 0.7 (L) 11/29/2017   SPEI  11/29/2017     Comment:     . Potential early nephrotic pattern.  Consider urine  protein  electrophoresis to confirm. .    No results found for: "TIBC", "FERRITIN", "IRONPCTSAT" No results found for: "LDH"   STUDIES:   EXAM:11/28/2022 CT CHEST, ABDOMEN, AND PELVIS WITH CONTRAST   TECHNIQUE: Multidetector CT imaging of the chest, abdomen and pelvis was performed following  the standard protocol during bolus administration of intravenous contrast.   RADIATION DOSE REDUCTION: This exam was performed according to the departmental dose-optimization program which includes automated exposure control, adjustment of the mA and/or kV according to patient size and/or use of iterative reconstruction technique.   CONTRAST:  OMNIPAQUE IOHEXOL 300 MG/ML  SOLN   COMPARISON:  Prior CTs 11/24/2021 and 11/20/2020.   FINDINGS: CT CHEST FINDINGS   Cardiovascular: Diffuse atherosclerosis of the aorta, great vessels and coronary arteries. No acute vascular findings. The heart size is normal. There is no pericardial effusion.   Mediastinum/Nodes: There are no enlarged mediastinal, hilar or axillary lymph nodes. Stable small hiatal hernia. The thyroid gland and trachea appear unremarkable.   Lungs/Pleura: No pleural effusion or pneumothorax. Stable chronic small nodules at both lung bases consistent with benign findings. No new or enlarging pulmonary nodules. Mild chronic atelectasis or scarring at the right lung base.   Musculoskeletal/Chest wall: No chest wall mass or suspicious osseous findings. Stable T6 hemangioma.   CT ABDOMEN AND PELVIS FINDINGS   Hepatobiliary: The liver is normal in density without suspicious focal abnormality. No evidence of gallstones, gallbladder wall thickening or biliary dilatation.   Pancreas: Unremarkable. No pancreatic ductal dilatation or surrounding inflammatory changes.   Spleen: Normal in size without focal abnormality.   Adrenals/Urinary Tract: Both adrenal glands appear normal. No evidence of urinary tract calculus, suspicious renal lesion or hydronephrosis. Partial duplication of the left ureter. The bladder is moderately distended without wall thickening or other focal abnormality.   Stomach/Bowel: No enteric contrast administered. The stomach appears unremarkable for its degree of distension. No evidence of  bowel wall thickening, distention or surrounding inflammatory change. Stable postsurgical changes consistent with previous appendectomy and probable partial distal small-bowel resection.   Vascular/Lymphatic: There are no enlarged abdominal or pelvic lymph nodes. Mild aortic and branch vessel atherosclerosis.   Reproductive: Unchanged appearance of the prostate gland which is small with calcifications. No recurrent mass lesion.   Other: No evidence of abdominal wall mass or hernia. No ascites.   Musculoskeletal: No acute or significant osseous findings. Mild spondylosis.   IMPRESSION: 1. Stable CT of the chest, abdomen and pelvis. No evidence of local recurrence or metastatic disease. 2. Stable small pulmonary nodules at both lung bases, consistent with benign findings. 3. Stable postsurgical changes in the abdomen. 4. Stable small hiatal hernia. 5.  Aortic Atherosclerosis (ICD10-I70.0).   Allergies: No Known Allergies  Current Medications: Current Outpatient Medications  Medication Sig Dispense Refill   amitriptyline (ELAVIL) 25 MG tablet TAKE 1 TABLET BY MOUTH EVERY NIGHT AT BEDTIME 90 tablet 2   aspirin EC (ASPIRIN LOW DOSE) 81 MG tablet TAKE 1 TABLET BY MOUTH DAILY 90 tablet 2   Cholecalciferol (VITAMIN D3) 5000 units CAPS Take 1 capsule by mouth daily.      Dulaglutide (TRULICITY) 4.5 MG/0.5ML SOPN ADMINISTER 4.5 MG UNDER THE SKIN 1 TIME A WEEK 6 mL 3   levothyroxine (SYNTHROID) 112 MCG tablet Take 1 tablet (112 mcg total) by mouth as directed. Take on days pt is not taking the 100 mcg 90 tablet 2   nitroGLYCERIN (NITROSTAT) 0.4 MG SL tablet Place 0.4 mg under the tongue every 5 (five)  minutes as needed for chest pain.     NOVOLOG FLEXPEN 100 UNIT/ML FlexPen Inject 5 Units into the skin with breakfast, with lunch, and with evening meal.     omeprazole (PRILOSEC) 40 MG capsule Take 40 mg by mouth daily.      pregabalin (LYRICA) 75 MG capsule Take 1 capsule (75 mg total) by  mouth 3 (three) times daily. 90 capsule 2   rosuvastatin (CRESTOR) 40 MG tablet Take 1 tablet (40 mg total) by mouth daily. 90 tablet 2   SYNJARDY XR 12.04-999 MG TB24 TAKE 2 TABLETS BY MOUTH DAILY 60 tablet 1   TRESIBA FLEXTOUCH 100 UNIT/ML FlexTouch Pen Inject 40 Units into the skin daily.     No current facility-administered medications for this visit.

## 2023-03-09 NOTE — Telephone Encounter (Signed)
Patient has been scheduled for follow-up visit per 03/09/23 LOS.  Pt given an appt calendar with date and time.

## 2023-03-13 ENCOUNTER — Ambulatory Visit: Payer: BC Managed Care – PPO | Admitting: Internal Medicine

## 2023-03-13 ENCOUNTER — Encounter: Payer: Self-pay | Admitting: Internal Medicine

## 2023-03-13 VITALS — BP 120/70 | HR 58 | Temp 97.3°F | Resp 18 | Ht 73.0 in | Wt 203.2 lb

## 2023-03-13 DIAGNOSIS — I10 Essential (primary) hypertension: Secondary | ICD-10-CM

## 2023-03-13 DIAGNOSIS — Z794 Long term (current) use of insulin: Secondary | ICD-10-CM

## 2023-03-13 DIAGNOSIS — E782 Mixed hyperlipidemia: Secondary | ICD-10-CM | POA: Diagnosis not present

## 2023-03-13 DIAGNOSIS — E039 Hypothyroidism, unspecified: Secondary | ICD-10-CM

## 2023-03-13 DIAGNOSIS — E113511 Type 2 diabetes mellitus with proliferative diabetic retinopathy with macular edema, right eye: Secondary | ICD-10-CM

## 2023-03-13 NOTE — Assessment & Plan Note (Signed)
Controlled.  

## 2023-03-13 NOTE — Assessment & Plan Note (Signed)
He is on levothyroxine 112 mcg daily

## 2023-03-13 NOTE — Assessment & Plan Note (Signed)
He will keep increasing the dose of tresiba for fasting sugar above 150 mg/dl.
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
## 2023-03-13 NOTE — Assessment & Plan Note (Signed)
Will do lipid panel on next visit. 

## 2023-03-15 ENCOUNTER — Other Ambulatory Visit: Payer: Self-pay | Admitting: Internal Medicine

## 2023-03-18 ENCOUNTER — Other Ambulatory Visit: Payer: Self-pay | Admitting: Internal Medicine

## 2023-03-30 ENCOUNTER — Other Ambulatory Visit: Payer: Self-pay | Admitting: Internal Medicine

## 2023-03-31 NOTE — Telephone Encounter (Signed)
Not filled since 08/02/21 not on current med list

## 2023-04-02 ENCOUNTER — Encounter: Payer: Self-pay | Admitting: Oncology

## 2023-04-04 ENCOUNTER — Inpatient Hospital Stay: Payer: BC Managed Care – PPO | Attending: Oncology

## 2023-04-04 ENCOUNTER — Encounter: Payer: Self-pay | Admitting: Oncology

## 2023-04-04 VITALS — BP 148/81 | HR 79 | Temp 97.6°F | Resp 17 | Wt 202.0 lb

## 2023-04-04 DIAGNOSIS — Z5111 Encounter for antineoplastic chemotherapy: Secondary | ICD-10-CM | POA: Insufficient documentation

## 2023-04-04 DIAGNOSIS — C61 Malignant neoplasm of prostate: Secondary | ICD-10-CM | POA: Insufficient documentation

## 2023-04-04 MED ORDER — LEUPROLIDE ACETATE (4 MONTH) 30 MG ~~LOC~~ KIT
30.0000 mg | PACK | Freq: Once | SUBCUTANEOUS | Status: AC
  Administered 2023-04-04: 30 mg via SUBCUTANEOUS
  Filled 2023-04-04: qty 30

## 2023-04-04 NOTE — Patient Instructions (Signed)
Leuprolide Suspension for Injection (Prostate Cancer) What is this medication? LEUPROLIDE (loo PROE lide) reduces the symptoms of prostate cancer. It works by decreasing levels of the hormone testosterone in the body. This prevents prostate cancer cells from spreading or growing. This medicine may be used for other purposes; ask your health care provider or pharmacist if you have questions. COMMON BRAND NAME(S): Eligard, Lupron Depot, Lupron Depot-Ped, Lutrate Depot, Viadur What should I tell my care team before I take this medication? They need to know if you have any of these conditions: Diabetes Heart disease Heart failure High or low levels of electrolytes, such as magnesium, potassium, or sodium in your blood Irregular heartbeat or rhythm Seizures An unusual or allergic reaction to leuprolide, other medications, foods, dyes, or preservatives Pregnant or trying to get pregnant Breast-feeding How should I use this medication? This medication is injected under the skin or into a muscle. It is given by your care team in a hospital or clinic setting. Talk to your care team about the use of this medication in children. Special care may be needed. Overdosage: If you think you have taken too much of this medicine contact a poison control center or emergency room at once. NOTE: This medicine is only for you. Do not share this medicine with others. What if I miss a dose? Keep appointments for follow-up doses. It is important not to miss your dose. Call your care team if you are unable to keep an appointment. What may interact with this medication? Do not take this medication with any of the following: Cisapride Dronedarone Ketoconazole Levoketoconazole Pimozide Thioridazine This medication may also interact with the following: Other medications that cause heart rhythm changes This list may not describe all possible interactions. Give your health care provider a list of all the medicines,  herbs, non-prescription drugs, or dietary supplements you use. Also tell them if you smoke, drink alcohol, or use illegal drugs. Some items may interact with your medicine. What should I watch for while using this medication? Visit your care team for regular checks on your progress. Tell your care team if your symptoms do not start to get better or if they get worse. This medication may increase blood sugar. The risk may be higher in patients who already have diabetes. Ask your care team what you can do to lower the risk of diabetes while taking this medication. This medication may cause infertility. Talk to your care team if you are concerned about your fertility. Heart attacks and strokes have been reported with the use of this medication. Get emergency help if you develop signs or symptoms of a heart attack or stroke. Talk to your care team about the risks and benefits of this medication. What side effects may I notice from receiving this medication? Side effects that you should report to your care team as soon as possible: Allergic reactions--skin rash, itching, hives, swelling of the face, lips, tongue, or throat Heart attack--pain or tightness in the chest, shoulders, arms, or jaw, nausea, shortness of breath, cold or clammy skin, feeling faint or lightheaded Heart rhythm changes--fast or irregular heartbeat, dizziness, feeling faint or lightheaded, chest pain, trouble breathing High blood sugar (hyperglycemia)--increased thirst or amount of urine, unusual weakness or fatigue, blurry vision Mood swings, irritability, hostility Seizures Stroke--sudden numbness or weakness of the face, arm, or leg, trouble speaking, confusion, trouble walking, loss of balance or coordination, dizziness, severe headache, change in vision Thoughts of suicide or self-harm, worsening mood, feelings of depression Side   effects that usually do not require medical attention (report to your care team if they continue or  are bothersome): Bone pain Change in sex drive or performance General discomfort and fatigue Hot flashes Muscle pain Pain, redness, or irritation at injection site Swelling of the ankles, hands, or feet This list may not describe all possible side effects. Call your doctor for medical advice about side effects. You may report side effects to FDA at 1-800-FDA-1088. Where should I keep my medication? This medication is given in a hospital or clinic. It will not be stored at home. NOTE: This sheet is a summary. It may not cover all possible information. If you have questions about this medicine, talk to your doctor, pharmacist, or health care provider.  2023 Elsevier/Gold Standard (2022-02-14 00:00:00)  

## 2023-04-04 NOTE — Addendum Note (Signed)
Addended by: Domenic Schwab on: 04/04/2023 10:29 AM   Modules accepted: Orders

## 2023-04-15 ENCOUNTER — Other Ambulatory Visit: Payer: Self-pay | Admitting: Internal Medicine

## 2023-04-17 ENCOUNTER — Other Ambulatory Visit: Payer: Self-pay | Admitting: Internal Medicine

## 2023-04-17 MED ORDER — PREGABALIN 75 MG PO CAPS: 75.0000 mg | ORAL_CAPSULE | Freq: Three times a day (TID) | ORAL | 2 refills | Status: DC

## 2023-04-19 DIAGNOSIS — E119 Type 2 diabetes mellitus without complications: Secondary | ICD-10-CM | POA: Diagnosis not present

## 2023-05-08 ENCOUNTER — Other Ambulatory Visit: Payer: Self-pay

## 2023-05-08 MED ORDER — SYNJARDY XR 12.5-1000 MG PO TB24: 2.0000 | ORAL_TABLET | Freq: Every day | ORAL | 1 refills | Status: DC

## 2023-05-20 DIAGNOSIS — E119 Type 2 diabetes mellitus without complications: Secondary | ICD-10-CM | POA: Diagnosis not present

## 2023-06-14 ENCOUNTER — Ambulatory Visit: Payer: BC Managed Care – PPO | Admitting: Internal Medicine

## 2023-06-14 ENCOUNTER — Encounter: Payer: Self-pay | Admitting: Internal Medicine

## 2023-06-14 ENCOUNTER — Other Ambulatory Visit: Payer: Self-pay

## 2023-06-14 VITALS — BP 124/80 | HR 64 | Temp 97.7°F | Resp 18 | Ht 71.0 in | Wt 202.2 lb

## 2023-06-14 DIAGNOSIS — Z794 Long term (current) use of insulin: Secondary | ICD-10-CM | POA: Diagnosis not present

## 2023-06-14 DIAGNOSIS — E039 Hypothyroidism, unspecified: Secondary | ICD-10-CM | POA: Diagnosis not present

## 2023-06-14 DIAGNOSIS — I10 Essential (primary) hypertension: Secondary | ICD-10-CM | POA: Diagnosis not present

## 2023-06-14 DIAGNOSIS — E113511 Type 2 diabetes mellitus with proliferative diabetic retinopathy with macular edema, right eye: Secondary | ICD-10-CM

## 2023-06-14 DIAGNOSIS — E782 Mixed hyperlipidemia: Secondary | ICD-10-CM

## 2023-06-14 DIAGNOSIS — I25118 Atherosclerotic heart disease of native coronary artery with other forms of angina pectoris: Secondary | ICD-10-CM

## 2023-06-14 MED ORDER — OZEMPIC (0.25 OR 0.5 MG/DOSE) 2 MG/1.5ML ~~LOC~~ SOPN: 0.5000 mg | PEN_INJECTOR | SUBCUTANEOUS | 3 refills | Status: DC

## 2023-06-14 NOTE — Assessment & Plan Note (Signed)
stable °

## 2023-06-14 NOTE — Assessment & Plan Note (Signed)
controlled 

## 2023-06-14 NOTE — Assessment & Plan Note (Signed)
His numbers are good and I will HbA1c today

## 2023-06-14 NOTE — Assessment & Plan Note (Signed)
I will repeat TSH today. 
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
## 2023-06-15 LAB — TSH: TSH: 27.6 u[IU]/mL — ABNORMAL HIGH (ref 0.450–4.500)

## 2023-06-15 LAB — HEMOGLOBIN A1C
Est. average glucose Bld gHb Est-mCnc: 146 mg/dL
Hgb A1c MFr Bld: 6.7 % — ABNORMAL HIGH (ref 4.8–5.6)

## 2023-06-19 DIAGNOSIS — E119 Type 2 diabetes mellitus without complications: Secondary | ICD-10-CM | POA: Diagnosis not present

## 2023-07-03 ENCOUNTER — Other Ambulatory Visit: Payer: Self-pay | Admitting: Internal Medicine

## 2023-07-03 MED ORDER — LEVOTHYROXINE SODIUM 125 MCG PO TABS: 125.0000 ug | ORAL_TABLET | Freq: Every day | ORAL | 11 refills | Status: DC

## 2023-07-06 DIAGNOSIS — E113512 Type 2 diabetes mellitus with proliferative diabetic retinopathy with macular edema, left eye: Secondary | ICD-10-CM | POA: Diagnosis not present

## 2023-07-18 ENCOUNTER — Other Ambulatory Visit: Payer: Self-pay | Admitting: Internal Medicine

## 2023-07-20 ENCOUNTER — Other Ambulatory Visit: Payer: Self-pay

## 2023-07-20 ENCOUNTER — Other Ambulatory Visit: Payer: Self-pay | Admitting: Internal Medicine

## 2023-07-20 DIAGNOSIS — E119 Type 2 diabetes mellitus without complications: Secondary | ICD-10-CM | POA: Diagnosis not present

## 2023-07-20 MED ORDER — PREGABALIN 75 MG PO CAPS: 75.0000 mg | ORAL_CAPSULE | Freq: Three times a day (TID) | ORAL | 2 refills | Status: DC

## 2023-07-20 MED ORDER — TRESIBA FLEXTOUCH 100 UNIT/ML ~~LOC~~ SOPN: 40.0000 [IU] | PEN_INJECTOR | Freq: Every day | SUBCUTANEOUS | 6 refills | Status: DC

## 2023-07-20 MED ORDER — NOVOLOG FLEXPEN 100 UNIT/ML ~~LOC~~ SOPN: 5.0000 [IU] | PEN_INJECTOR | Freq: Three times a day (TID) | SUBCUTANEOUS | 1 refills | Status: DC

## 2023-08-01 ENCOUNTER — Encounter: Payer: Self-pay | Admitting: Oncology
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
## 2023-08-02 ENCOUNTER — Inpatient Hospital Stay: Payer: BC Managed Care – PPO

## 2023-08-02 ENCOUNTER — Encounter: Payer: Self-pay | Admitting: Hematology and Oncology

## 2023-08-02 ENCOUNTER — Inpatient Hospital Stay: Payer: BC Managed Care – PPO | Attending: Oncology | Admitting: Hematology and Oncology

## 2023-08-02 VITALS — BP 134/82 | HR 75 | Temp 98.1°F | Resp 20 | Ht 71.0 in | Wt 198.9 lb

## 2023-08-02 VITALS — BP 125/78 | HR 68 | Temp 98.1°F | Resp 18 | Ht 71.0 in | Wt 200.2 lb

## 2023-08-02 DIAGNOSIS — C61 Malignant neoplasm of prostate: Secondary | ICD-10-CM

## 2023-08-02 DIAGNOSIS — Z79899 Other long term (current) drug therapy: Secondary | ICD-10-CM | POA: Insufficient documentation

## 2023-08-02 DIAGNOSIS — Z5111 Encounter for antineoplastic chemotherapy: Secondary | ICD-10-CM | POA: Insufficient documentation

## 2023-08-02 LAB — CBC WITH DIFFERENTIAL (CANCER CENTER ONLY)
Abs Immature Granulocytes: 0.02 10*3/uL (ref 0.00–0.07)
Basophils Absolute: 0.1 10*3/uL (ref 0.0–0.1)
Basophils Relative: 1 %
Eosinophils Absolute: 0.3 10*3/uL (ref 0.0–0.5)
Eosinophils Relative: 4 %
HCT: 44.7 % (ref 39.0–52.0)
Hemoglobin: 13.8 g/dL (ref 13.0–17.0)
Immature Granulocytes: 0 %
Lymphocytes Relative: 28 %
Lymphs Abs: 2.1 10*3/uL (ref 0.7–4.0)
MCH: 26.6 pg (ref 26.0–34.0)
MCHC: 30.9 g/dL (ref 30.0–36.0)
MCV: 86.1 fL (ref 80.0–100.0)
Monocytes Absolute: 0.9 10*3/uL (ref 0.1–1.0)
Monocytes Relative: 12 %
Neutro Abs: 4.2 10*3/uL (ref 1.7–7.7)
Neutrophils Relative %: 55 %
Platelet Count: 202 10*3/uL (ref 150–400)
RBC: 5.19 MIL/uL (ref 4.22–5.81)
RDW: 14.8 % (ref 11.5–15.5)
WBC Count: 7.7 10*3/uL (ref 4.0–10.5)
nRBC: 0 % (ref 0.0–0.2)

## 2023-08-02 LAB — CMP (CANCER CENTER ONLY)
ALT: 16 U/L (ref 0–44)
AST: 19 U/L (ref 15–41)
Albumin: 4.1 g/dL (ref 3.5–5.0)
Alkaline Phosphatase: 41 U/L (ref 38–126)
Anion gap: 11 (ref 5–15)
BUN: 21 mg/dL (ref 8–23)
CO2: 25 mmol/L (ref 22–32)
Calcium: 9.9 mg/dL (ref 8.9–10.3)
Chloride: 100 mmol/L (ref 98–111)
Creatinine: 0.97 mg/dL (ref 0.61–1.24)
GFR, Estimated: >60 mL/min (ref >60)
Glucose, Bld: 138 mg/dL — ABNORMAL HIGH (ref 70–99)
Potassium: 4.1 mmol/L (ref 3.5–5.1)
Sodium: 136 mmol/L (ref 135–145)
Total Bilirubin: 0.3 mg/dL (ref 0.3–1.2)
Total Protein: 7.8 g/dL (ref 6.5–8.1)

## 2023-08-02 LAB — PSA: Prostatic Specific Antigen: <0.01 ng/mL (ref 0.00–4.00)

## 2023-08-02 MED ORDER — LEUPROLIDE ACETATE (4 MONTH) 30 MG ~~LOC~~ KIT
30.0000 mg | PACK | Freq: Once | SUBCUTANEOUS | Status: AC
  Administered 2023-08-02: 30 mg via SUBCUTANEOUS
  Filled 2023-08-02: qty 30

## 2023-08-02 NOTE — Assessment & Plan Note (Addendum)
Locally advanced prostate cancer with lymphadenopathy diagnosed in 2018.  He has castration-sensitive disease. He received his Zytiga for close to 2 years with severe toxicities including hypertension.  He decided against any additional therapy.  CT abdomen and pelvis in December 2023 did not reveal any evidence of recurrent disease.  He continues androgen deprivation therapy with leuprolide injections every 4 months. His PSA remains undetectable.  He will proceed with leuprolide today.  We will plan to see him back in 4 months with a CBC, comprehensive metabolic panel and PSA prior to his next leuprolide. Prior to that visit he will have a CT abdomen and pelvis for continued surveillance.

## 2023-08-02 NOTE — Patient Instructions (Signed)

## 2023-08-03 ENCOUNTER — Telehealth: Payer: Self-pay

## 2023-08-03 ENCOUNTER — Encounter: Payer: Self-pay | Admitting: Oncology

## 2023-08-03 NOTE — Telephone Encounter (Signed)
Detailed message left for patient.

## 2023-08-03 NOTE — Telephone Encounter (Signed)
-----   Message from Adah Perl sent at 08/02/2023  6:04 PM EDT ----- Please let him know his labs look good, PSA still less than 0.1. Also, Dr. Gilman Buttner wants him to have a routine scan before his next visit in 4 months.Thanks

## 2023-08-07 ENCOUNTER — Encounter: Payer: Self-pay | Admitting: Internal Medicine

## 2023-08-07 ENCOUNTER — Ambulatory Visit: Payer: BC Managed Care – PPO | Admitting: Internal Medicine

## 2023-08-07 VITALS — BP 122/72 | HR 73 | Temp 97.4°F | Resp 18 | Ht 71.0 in | Wt 200.0 lb

## 2023-08-07 DIAGNOSIS — S91302A Unspecified open wound, left foot, initial encounter: Secondary | ICD-10-CM | POA: Diagnosis not present

## 2023-08-07 MED ORDER — TRESIBA FLEXTOUCH 100 UNIT/ML ~~LOC~~ SOPN: 40.0000 [IU] | PEN_INJECTOR | Freq: Every day | SUBCUTANEOUS | 6 refills | Status: DC

## 2023-08-07 MED ORDER — AMOXICILLIN-POT CLAVULANATE 875-125 MG PO TABS: 1.0000 | ORAL_TABLET | Freq: Two times a day (BID) | ORAL | 0 refills | Status: DC

## 2023-08-07 NOTE — Assessment & Plan Note (Signed)
Due to rusty nail left foot on Saturday 2 days ago. He has tetnus more than 10 years ago so I will give tetnus injection now

## 2023-08-07 NOTE — Progress Notes (Signed)
   Acute Office Visit  Subjective:     Patient ID: Adam Cruz, male    DOB: 1961-03-29, 62 y.o.   MRN: [id number]  Chief Complaint  Patient presents with   office visit    Left toe stepped on a nail toe is black and blue     HPI Patient is in today for  rusty nail stuck to his left greater toe on Saturday that was 2 days ago.  He says that he noticed swelling and pain on movement of his left greater toe.  He has diabetes mellitus so he wanted to be checked.  He says that his tetanus shot was more than 10 years ago.   He has diabetes mellitus and he take Tresiba 40 units daily.  He was also started on Ozempic.  Review of Systems  Constitutional: Negative.   Musculoskeletal:  Positive for joint pain.       Swelling and punctured wound left great toe         Objective:    BP 122/72 (BP Location: Left Arm, Patient Position: Sitting, Cuff Size: Normal)   Pulse 73   Temp (!) 97.4 F (36.3 C)   Resp 18   Ht 5\' 11"  (1.803 m)   Wt 200 lb (90.7 kg)   SpO2 99%   BMI 27.89 kg/m    Physical Exam Constitutional:      Appearance: Normal appearance.  Musculoskeletal:     Comments: Swelling and punctured wound between left great toe and 2nd toe.  Neurological:     Mental Status: He is alert.     No results found for any visits on 08/07/23.      Assessment & Plan:   Problem List Items Addressed This Visit       Other   Unspecified open wound, left foot, initial encounter - Primary    Due to rusty nail left foot on Saturday 2 days ago. He has tetnus more than 10 years ago so I will give tetnus injection now       Meds ordered this encounter  Medications   amoxicillin-clavulanate (AUGMENTIN) 875-125 MG tablet    Sig: Take 1 tablet by mouth 2 (two) times daily.    Dispense:  14 tablet    Refill:  0   TRESIBA FLEXTOUCH 100 UNIT/ML FlexTouch Pen    Sig: Inject 40 Units into the skin daily.    Dispense:  12 mL    Refill:  6     I will also give him tetanus  shot. No follow-ups on file.  Eloisa Northern, MD

## 2023-08-16 ENCOUNTER — Other Ambulatory Visit: Payer: Self-pay | Admitting: Internal Medicine

## 2023-08-16 MED ORDER — PREGABALIN 100 MG PO CAPS: 100.0000 mg | ORAL_CAPSULE | Freq: Three times a day (TID) | ORAL | 2 refills | Status: DC

## 2023-08-20 DIAGNOSIS — E119 Type 2 diabetes mellitus without complications: Secondary | ICD-10-CM | POA: Diagnosis not present

## 2023-09-04 ENCOUNTER — Encounter: Payer: Self-pay | Admitting: Internal Medicine

## 2023-09-04 ENCOUNTER — Ambulatory Visit: Payer: Self-pay | Admitting: Internal Medicine

## 2023-09-04 VITALS — BP 120/78 | HR 85 | Temp 97.3°F | Ht 71.0 in | Wt 199.0 lb

## 2023-09-04 DIAGNOSIS — M25421 Effusion, right elbow: Secondary | ICD-10-CM | POA: Insufficient documentation

## 2023-09-04 DIAGNOSIS — K219 Gastro-esophageal reflux disease without esophagitis: Secondary | ICD-10-CM | POA: Diagnosis not present

## 2023-09-04 MED ORDER — OMEPRAZOLE 40 MG PO CPDR: 40.0000 mg | DELAYED_RELEASE_CAPSULE | Freq: Every day | ORAL | 3 refills | Status: DC

## 2023-09-04 NOTE — Assessment & Plan Note (Signed)
I have cleaned the area with betadine X 2, alcohol and 10 cc blood stained fluid was removed and dressing was applied.

## 2023-09-04 NOTE — Progress Notes (Signed)
Acute Office Visit  Subjective:     Patient ID: Adam Cruz, male    DOB: March 25, 1961, 62 y.o.   MRN: [id number]  Chief Complaint  Patient presents with   office visit    Knot on elbow about  2 month ago     HPI Patient is in today for swelling posterior toright elbow for few weeks that is slowly getting worse. No pain. No redness. No pain on movement of right elbow. He denies any injury or trauma.   He also is asking for medicine for GERD that he use as needed basis, as he started having symptoms again.   Review of Systems  Constitutional: Negative.   Musculoskeletal:        Swelling posterior elbow        Objective:    BP 120/78 (BP Location: Left Arm, Patient Position: Sitting, Cuff Size: Normal)   Pulse 85   Temp (!) 97.3 F (36.3 C)   Ht 5\' 11"  (1.803 m)   Wt 199 lb (90.3 kg)   SpO2 90%   BMI 27.75 kg/m    Physical Exam Constitutional:      Appearance: Normal appearance.  Musculoskeletal:        General: Swelling present. No tenderness.  Neurological:     Mental Status: He is alert.     No results found for any visits on 09/04/23.      Assessment & Plan:   Problem List Items Addressed This Visit       Digestive   Gastroesophageal reflux disease   Relevant Medications   omeprazole (PRILOSEC) 40 MG capsule     Musculoskeletal and Integument   Effusion of right olecranon bursa - Primary    I have cleaned the area with betadine X 2, alcohol and 10 cc blood stained fluid was removed and dressing was applied.        No orders of the defined types were placed in this encounter.   No follow-ups on file.  Eloisa Northern, MD

## 2023-09-08 ENCOUNTER — Other Ambulatory Visit: Payer: Self-pay | Admitting: Internal Medicine

## 2023-09-11 ENCOUNTER — Other Ambulatory Visit: Payer: Self-pay | Admitting: Internal Medicine

## 2023-09-13 ENCOUNTER — Ambulatory Visit: Payer: Self-pay | Admitting: Internal Medicine

## 2023-09-13 ENCOUNTER — Encounter: Payer: Self-pay | Admitting: Internal Medicine

## 2023-09-13 VITALS — BP 124/78 | Temp 97.7°F | Resp 18 | Ht 71.0 in | Wt 196.0 lb

## 2023-09-13 DIAGNOSIS — Z794 Long term (current) use of insulin: Secondary | ICD-10-CM

## 2023-09-13 DIAGNOSIS — E782 Mixed hyperlipidemia: Secondary | ICD-10-CM

## 2023-09-13 DIAGNOSIS — I25118 Atherosclerotic heart disease of native coronary artery with other forms of angina pectoris: Secondary | ICD-10-CM

## 2023-09-13 DIAGNOSIS — E039 Hypothyroidism, unspecified: Secondary | ICD-10-CM

## 2023-09-13 DIAGNOSIS — E113511 Type 2 diabetes mellitus with proliferative diabetic retinopathy with macular edema, right eye: Secondary | ICD-10-CM

## 2023-09-13 DIAGNOSIS — I10 Essential (primary) hypertension: Secondary | ICD-10-CM

## 2023-09-13 MED ORDER — AMITRIPTYLINE HCL 25 MG PO TABS: 25.0000 mg | ORAL_TABLET | Freq: Every day | ORAL | 2 refills | Status: DC

## 2023-09-13 MED ORDER — OZEMPIC (1 MG/DOSE) 4 MG/3ML ~~LOC~~ SOPN: 1.0000 mg | PEN_INJECTOR | SUBCUTANEOUS | 6 refills | Status: DC

## 2023-09-13 MED ORDER — OMEPRAZOLE 40 MG PO CPDR: 40.0000 mg | DELAYED_RELEASE_CAPSULE | Freq: Every day | ORAL | 3 refills | Status: DC

## 2023-09-13 NOTE — Assessment & Plan Note (Addendum)
Stable, he will see cardiologist in December.

## 2023-09-13 NOTE — Assessment & Plan Note (Signed)
I will increase the dose of ozympic to 1 mg and he will decrease tresiba to 35 units daily.
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
## 2023-09-13 NOTE — Assessment & Plan Note (Signed)
controlled 

## 2023-09-13 NOTE — Assessment & Plan Note (Signed)
I have increased the dose of levothyroxine to 125 mcg daily on last visit, I will repeat TSH today.

## 2023-09-13 NOTE — Assessment & Plan Note (Signed)
I will do lipid panel today.

## 2023-09-15 ENCOUNTER — Ambulatory Visit: Payer: Self-pay | Admitting: Internal Medicine

## 2023-09-15 VITALS — BP 122/80 | HR 77 | Temp 97.8°F | Resp 18 | Ht 71.0 in | Wt 197.1 lb

## 2023-09-15 DIAGNOSIS — M25421 Effusion, right elbow: Secondary | ICD-10-CM

## 2023-09-15 NOTE — Assessment & Plan Note (Signed)
After cleaning area with betadine X 2 and alcohol, 7 cc blood stained fluid was drained and 40 mg triamcinolone was injected. Bleeding stopped with compression and bandage was applied.

## 2023-09-15 NOTE — Patient Instructions (Signed)
If swelling get worse or if he has any sign of infection then he will come back. He will monitor his sugar as steroid was injected in bursa.

## 2023-09-15 NOTE — Progress Notes (Signed)
Acute Office Visit  Subjective:     Patient ID: Adam Cruz, male    DOB: 1961/04/19, 62 y.o.   MRN: [id number]  Chief Complaint  Patient presents with   Elbow Abcess    HPI Patient is in today for right elbow olecranon bursa drain, he says that he keep rub that area while at work sitting on chair and it hurt and it bother him. I have drained 10 ml bloody fluid and it got better but it came back back.  He wanted it to be drained.   Review of Systems  Musculoskeletal:        Left olecranon bursa swelling.        Objective:    BP 122/80 (BP Location: Left Arm, Patient Position: Sitting)   Pulse 77   Temp 97.8 F (36.6 C)   Resp 18   Ht 5\' 11"  (1.803 m)   Wt 197 lb 2 oz (89.4 kg)   SpO2 96%   BMI 27.49 kg/m    Physical Exam Constitutional:      Appearance: Normal appearance.  Musculoskeletal:     Comments: Swelling of left olecranon bursa but mild tenderness, no redness and movement of elbow joint is normal.   Neurological:     Mental Status: He is alert.     No results found for any visits on 09/15/23.      Assessment & Plan:   Problem List Items Addressed This Visit       Musculoskeletal and Integument   Effusion of right olecranon bursa - Primary    After cleaning area with betadine X 2 and alcohol, 7 cc blood stained fluid was drained and 40 mg triamcinolone was injected. Bleeding stopped with compression and bandage was applied.         No orders of the defined types were placed in this encounter.   No follow-ups on file.  Eloisa Northern, MD

## 2023-09-18 ENCOUNTER — Other Ambulatory Visit: Payer: Self-pay | Admitting: Internal Medicine

## 2023-09-18 LAB — LIPID PANEL
Chol/HDL Ratio: 3.4 {ratio} (ref 0.0–5.0)
Cholesterol, Total: 117 mg/dL (ref 100–199)
HDL: 34 mg/dL — ABNORMAL LOW (ref >39)
LDL Chol Calc (NIH): 51 mg/dL (ref 0–99)
Triglycerides: 193 mg/dL — ABNORMAL HIGH (ref 0–149)
VLDL Cholesterol Cal: 32 mg/dL (ref 5–40)

## 2023-09-18 LAB — CMP14 + ANION GAP
ALT: 9 [IU]/L (ref 0–44)
AST: 13 [IU]/L (ref 0–40)
Albumin: 4.3 g/dL (ref 3.9–4.9)
Alkaline Phosphatase: 54 [IU]/L (ref 44–121)
Anion Gap: 14 mmol/L (ref 10.0–18.0)
BUN/Creatinine Ratio: 17 (ref 10–24)
BUN: 20 mg/dL (ref 8–27)
Bilirubin Total: 0.3 mg/dL (ref 0.0–1.2)
CO2: 23 mmol/L (ref 20–29)
Calcium: 9.8 mg/dL (ref 8.6–10.2)
Chloride: 103 mmol/L (ref 96–106)
Creatinine, Ser: 1.17 mg/dL (ref 0.76–1.27)
Globulin, Total: 2.4 g/dL (ref 1.5–4.5)
Glucose: 121 mg/dL — ABNORMAL HIGH (ref 70–99)
Potassium: 4.3 mmol/L (ref 3.5–5.2)
Sodium: 140 mmol/L (ref 134–144)
Total Protein: 6.7 g/dL (ref 6.0–8.5)
eGFR: 70 mL/min/{1.73_m2} (ref >59)

## 2023-09-18 LAB — MICROALBUMIN / CREATININE URINE RATIO
Creatinine, Urine: 40.2 mg/dL
Microalb/Creat Ratio: 342 mg/g{creat} — ABNORMAL HIGH (ref 0–29)
Microalbumin, Urine: 137.6 ug/mL

## 2023-09-18 LAB — SPECIMEN STATUS REPORT

## 2023-09-18 LAB — TSH: TSH: 0.064 u[IU]/mL — ABNORMAL LOW (ref 0.450–4.500)

## 2023-09-18 NOTE — Progress Notes (Signed)
Patient called.  Left message for patient to call back.

## 2023-09-21 ENCOUNTER — Encounter: Payer: Self-pay | Admitting: Oncology

## 2023-09-22 ENCOUNTER — Encounter: Payer: Self-pay | Admitting: Oncology

## 2023-09-27 ENCOUNTER — Encounter: Payer: Self-pay | Admitting: Oncology

## 2023-10-09 ENCOUNTER — Other Ambulatory Visit: Payer: Self-pay | Admitting: Internal Medicine

## 2023-10-21 ENCOUNTER — Other Ambulatory Visit: Payer: Self-pay | Admitting: Internal Medicine

## 2023-10-24 ENCOUNTER — Other Ambulatory Visit: Payer: Self-pay | Admitting: Internal Medicine

## 2023-10-25 ENCOUNTER — Other Ambulatory Visit: Payer: Self-pay

## 2023-10-25 MED ORDER — SYNJARDY XR 12.5-1000 MG PO TB24: 2.0000 | ORAL_TABLET | Freq: Every day | ORAL | 3 refills | Status: DC

## 2023-10-30 ENCOUNTER — Encounter: Payer: Self-pay | Admitting: Oncology

## 2023-10-31 ENCOUNTER — Encounter: Payer: Self-pay | Admitting: Oncology

## 2023-11-14 ENCOUNTER — Other Ambulatory Visit: Payer: Self-pay

## 2023-11-15 ENCOUNTER — Other Ambulatory Visit: Payer: Self-pay | Admitting: Internal Medicine

## 2023-11-20 ENCOUNTER — Other Ambulatory Visit: Payer: Self-pay | Admitting: Internal Medicine

## 2023-11-20 ENCOUNTER — Other Ambulatory Visit: Payer: Self-pay

## 2023-11-20 MED ORDER — PREGABALIN 100 MG PO CAPS: 100.0000 mg | ORAL_CAPSULE | Freq: Three times a day (TID) | ORAL | 2 refills | Status: DC

## 2023-11-22 DIAGNOSIS — C61 Malignant neoplasm of prostate: Secondary | ICD-10-CM | POA: Diagnosis not present

## 2023-11-22 LAB — HEPATIC FUNCTION PANEL
ALT: 64 U/L — AB (ref 10–40)
AST: 70 — AB (ref 14–40)
Alkaline Phosphatase: 59 (ref 25–125)
Bilirubin, Total: 0.5

## 2023-11-22 LAB — BASIC METABOLIC PANEL
BUN: 14 (ref 4–21)
CO2: 33 — AB (ref 13–22)
Chloride: 103 (ref 99–108)
Creatinine: 0.9 (ref 0.6–1.3)
Glucose: 157
Potassium: 4 meq/L (ref 3.5–5.1)
Sodium: 137 (ref 137–147)

## 2023-11-22 LAB — CBC AND DIFFERENTIAL
HCT: 42 (ref 41–53)
Hemoglobin: 13.7 (ref 13.5–17.5)
Neutrophils Absolute: 4.35
Platelets: 206 10*3/uL (ref 150–400)
WBC: 7.5

## 2023-11-22 LAB — COMPREHENSIVE METABOLIC PANEL
Albumin: 3.9 (ref 3.5–5.0)
Calcium: 9.8 (ref 8.7–10.7)

## 2023-11-22 LAB — CBC: RBC: 5 (ref 3.87–5.11)

## 2023-11-22 LAB — PSA: PSA: <0.1

## 2023-11-22 LAB — LAB REPORT - SCANNED: EGFR: 97

## 2023-12-04 ENCOUNTER — Encounter: Payer: Self-pay | Admitting: Hematology and Oncology

## 2023-12-06 ENCOUNTER — Inpatient Hospital Stay: Payer: BC Managed Care – PPO | Attending: Oncology | Admitting: Oncology

## 2023-12-06 ENCOUNTER — Inpatient Hospital Stay: Payer: BC Managed Care – PPO

## 2023-12-06 ENCOUNTER — Encounter: Payer: Self-pay | Admitting: Oncology

## 2023-12-06 VITALS — BP 119/71 | HR 77 | Temp 97.9°F | Resp 16 | Ht 71.0 in | Wt 207.3 lb

## 2023-12-06 DIAGNOSIS — Z5111 Encounter for antineoplastic chemotherapy: Secondary | ICD-10-CM | POA: Insufficient documentation

## 2023-12-06 DIAGNOSIS — C61 Malignant neoplasm of prostate: Secondary | ICD-10-CM

## 2023-12-06 DIAGNOSIS — Z79899 Other long term (current) drug therapy: Secondary | ICD-10-CM | POA: Diagnosis not present

## 2023-12-06 MED ORDER — LEUPROLIDE ACETATE (4 MONTH) 30 MG ~~LOC~~ KIT
30.0000 mg | PACK | Freq: Once | SUBCUTANEOUS | Status: AC
  Administered 2023-12-06: 30 mg via SUBCUTANEOUS
  Filled 2023-12-06: qty 30
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
## 2023-12-06 NOTE — Patient Instructions (Signed)

## 2023-12-08 ENCOUNTER — Ambulatory Visit: Payer: Self-pay | Admitting: Internal Medicine

## 2023-12-09 ENCOUNTER — Other Ambulatory Visit: Payer: Self-pay | Admitting: Internal Medicine

## 2023-12-14 ENCOUNTER — Ambulatory Visit: Payer: BC Managed Care – PPO | Admitting: Physician Assistant

## 2023-12-14 ENCOUNTER — Encounter: Payer: Self-pay | Admitting: Physician Assistant

## 2023-12-14 VITALS — BP 112/62 | HR 78 | Temp 97.1°F | Resp 14 | Ht 71.0 in | Wt 202.0 lb

## 2023-12-14 DIAGNOSIS — I10 Essential (primary) hypertension: Secondary | ICD-10-CM

## 2023-12-14 DIAGNOSIS — E039 Hypothyroidism, unspecified: Secondary | ICD-10-CM

## 2023-12-14 DIAGNOSIS — G629 Polyneuropathy, unspecified: Secondary | ICD-10-CM | POA: Insufficient documentation

## 2023-12-14 DIAGNOSIS — K5900 Constipation, unspecified: Secondary | ICD-10-CM | POA: Insufficient documentation

## 2023-12-14 DIAGNOSIS — E113511 Type 2 diabetes mellitus with proliferative diabetic retinopathy with macular edema, right eye: Secondary | ICD-10-CM | POA: Diagnosis not present

## 2023-12-14 DIAGNOSIS — E1140 Type 2 diabetes mellitus with diabetic neuropathy, unspecified: Secondary | ICD-10-CM

## 2023-12-14 DIAGNOSIS — E1142 Type 2 diabetes mellitus with diabetic polyneuropathy: Secondary | ICD-10-CM | POA: Insufficient documentation

## 2023-12-14 DIAGNOSIS — Z794 Long term (current) use of insulin: Secondary | ICD-10-CM

## 2023-12-14 DIAGNOSIS — E782 Mixed hyperlipidemia: Secondary | ICD-10-CM

## 2023-12-14 DIAGNOSIS — R748 Abnormal levels of other serum enzymes: Secondary | ICD-10-CM | POA: Diagnosis not present

## 2023-12-14 DIAGNOSIS — Z23 Encounter for immunization: Secondary | ICD-10-CM | POA: Diagnosis not present

## 2023-12-14 DIAGNOSIS — K5909 Other constipation: Secondary | ICD-10-CM

## 2023-12-14 DIAGNOSIS — C61 Malignant neoplasm of prostate: Secondary | ICD-10-CM

## 2023-12-14 NOTE — Assessment & Plan Note (Signed)
Controlled Continue to monitor Not currently on medication  Will adjust as needed

## 2023-12-14 NOTE — Assessment & Plan Note (Signed)
Recent improvement with stool softener use. -Continue current regimen of stool softener. -Encourage high fiber diet.

## 2023-12-14 NOTE — Assessment & Plan Note (Signed)
Patient on Ozempic, Tresiba, and Novolog, with recent HbA1c of 6.7. Patient expresses desire to reduce medication use. -Order HbA1c. -If HbA1c remains below 7, consider reducing insulin therapy, starting with discontinuation of short-acting Novolog.

## 2023-12-14 NOTE — Assessment & Plan Note (Signed)
Patient experiences burning sensation in feet, improved with Pregabalin. -Continue Pregabalin.

## 2023-12-14 NOTE — Assessment & Plan Note (Signed)
Recent elevation in liver enzymes, possibly related to Tylenol sinus use. No history of hepatitis, no alcohol use, no abdominal pain. -Order blood work including liver function tests and acetaminophen level. -If liver enzymes remain elevated and other tests are normal, order an ultrasound of the liver.

## 2023-12-14 NOTE — Assessment & Plan Note (Signed)
Patient on statin therapy, recent lipid panel showed good control. -Order lipid panel. -If lipid panel remains favorable, consider tapering off statin therapy.

## 2023-12-14 NOTE — Assessment & Plan Note (Signed)
Patient on Synthroid, dose has been adjusted multiple times recently. -Order TSH. -If TSH is within normal limits, maintain current dose of Synthroid.
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
## 2023-12-16 LAB — COMPREHENSIVE METABOLIC PANEL
ALT: 16 [IU]/L (ref 0–44)
AST: 19 [IU]/L (ref 0–40)
Albumin: 4.4 g/dL (ref 3.9–4.9)
Alkaline Phosphatase: 72 [IU]/L (ref 44–121)
BUN/Creatinine Ratio: 15 (ref 10–24)
BUN: 15 mg/dL (ref 8–27)
Bilirubin Total: 0.2 mg/dL (ref 0.0–1.2)
CO2: 21 mmol/L (ref 20–29)
Calcium: 10.2 mg/dL (ref 8.6–10.2)
Chloride: 103 mmol/L (ref 96–106)
Creatinine, Ser: 0.98 mg/dL (ref 0.76–1.27)
Globulin, Total: 2.9 g/dL (ref 1.5–4.5)
Glucose: 129 mg/dL — ABNORMAL HIGH (ref 70–99)
Potassium: 4.7 mmol/L (ref 3.5–5.2)
Sodium: 141 mmol/L (ref 134–144)
Total Protein: 7.3 g/dL (ref 6.0–8.5)
eGFR: 87 mL/min/{1.73_m2} (ref >59)

## 2023-12-16 LAB — CBC WITH DIFFERENTIAL/PLATELET
Basophils Absolute: 0.1 10*3/uL (ref 0.0–0.2)
Basos: 1 %
EOS (ABSOLUTE): 0.3 10*3/uL (ref 0.0–0.4)
Eos: 3 %
Hematocrit: 43.9 % (ref 37.5–51.0)
Hemoglobin: 13.8 g/dL (ref 13.0–17.7)
Immature Grans (Abs): 0 10*3/uL (ref 0.0–0.1)
Immature Granulocytes: 0 %
Lymphocytes Absolute: 2.5 10*3/uL (ref 0.7–3.1)
Lymphs: 28 %
MCH: 26.6 pg (ref 26.6–33.0)
MCHC: 31.4 g/dL — ABNORMAL LOW (ref 31.5–35.7)
MCV: 85 fL (ref 79–97)
Monocytes Absolute: 0.8 10*3/uL (ref 0.1–0.9)
Monocytes: 9 %
Neutrophils Absolute: 5.2 10*3/uL (ref 1.4–7.0)
Neutrophils: 59 %
Platelets: 228 10*3/uL (ref 150–450)
RBC: 5.19 x10E6/uL (ref 4.14–5.80)
RDW: 14.5 % (ref 11.6–15.4)
WBC: 8.9 10*3/uL (ref 3.4–10.8)

## 2023-12-16 LAB — LIPID PANEL
Chol/HDL Ratio: 5.5 {ratio} — ABNORMAL HIGH (ref 0.0–5.0)
Cholesterol, Total: 235 mg/dL — ABNORMAL HIGH (ref 100–199)
HDL: 43 mg/dL (ref >39)
LDL Chol Calc (NIH): 120 mg/dL — ABNORMAL HIGH (ref 0–99)
Triglycerides: 410 mg/dL — ABNORMAL HIGH (ref 0–149)
VLDL Cholesterol Cal: 72 mg/dL — ABNORMAL HIGH (ref 5–40)

## 2023-12-16 LAB — TSH: TSH: 3.36 u[IU]/mL (ref 0.450–4.500)

## 2023-12-16 LAB — HEMOGLOBIN A1C
Est. average glucose Bld gHb Est-mCnc: 180 mg/dL
Hgb A1c MFr Bld: 7.9 % — ABNORMAL HIGH (ref 4.8–5.6)

## 2023-12-16 LAB — T4, FREE: Free T4: 1.26 ng/dL (ref 0.82–1.77)

## 2023-12-16 LAB — ACUTE VIRAL HEPATITIS (HAV, HBV, HCV)
HCV Ab: NONREACTIVE
Hep A IgM: NEGATIVE
Hep B C IgM: NEGATIVE
Hepatitis B Surface Ag: NEGATIVE

## 2023-12-16 LAB — ACETAMINOPHEN LEVEL: Acetaminophen (Tylenol), S: NOT DETECTED ug/mL (ref 10–30)

## 2023-12-16 LAB — HCV INTERPRETATION

## 2023-12-24 ENCOUNTER — Other Ambulatory Visit: Payer: Self-pay | Admitting: Internal Medicine

## 2024-02-10 ENCOUNTER — Other Ambulatory Visit: Payer: Self-pay | Admitting: Internal Medicine

## 2024-02-15 ENCOUNTER — Other Ambulatory Visit: Payer: Self-pay | Admitting: Internal Medicine

## 2024-02-16 ENCOUNTER — Other Ambulatory Visit: Payer: Self-pay | Admitting: Internal Medicine

## 2024-02-16 MED ORDER — PREGABALIN 100 MG PO CAPS: 100.0000 mg | ORAL_CAPSULE | Freq: Three times a day (TID) | ORAL | 2 refills | Status: DC

## 2024-02-19 ENCOUNTER — Other Ambulatory Visit: Payer: Self-pay | Admitting: Physician Assistant

## 2024-02-19 MED ORDER — SYNJARDY XR 12.5-1000 MG PO TB24: 2.0000 | ORAL_TABLET | Freq: Every day | ORAL | 3 refills | Status: DC

## 2024-02-19 NOTE — Telephone Encounter (Signed)
 Copied from CRM [id number]. Topic: Clinical - Medication Refill >> Feb 19, 2024 12:40 PM Alessandra Bevels wrote: Most Recent Primary Care Visit:  Provider: Langley Gauss  Department: [facility]  Visit Type: NEW PT - OFFICE VISIT  Date: 12/14/2023  Medication: SYNJARDY XR 12.04-999 MG TB24 [[id number]]  Has the patient contacted their pharmacy? Yes (Agent: If no, request that the patient contact the pharmacy for the refill. If patient does not wish to contact the pharmacy document the reason why and proceed with request.) (Agent: If yes, when and what did the pharmacy advise?)  Is this the correct pharmacy for this prescription? Yes If no, delete pharmacy and type the correct one.  This is the patient's preferred pharmacy:  [id number] [place], [place] - [phone] [place] [address] Phone: [phone] Fax: [phone]  [facility] - [place], [place] - [address] [place] [address] Phone: [phone] Fax: [phone]  [email or web address] [facility] - [place], [place] - [address] [place] [address] Phone: [phone] Fax: [phone]   Has the prescription been filled recently? Yes  Is the patient out of the medication? Yes  Has the patient been seen for an appointment in the last year OR does the patient have an upcoming appointment? Yes  Can we respond through MyChart? Yes  Agent: Please be advised that Rx refills may take up to 3 business days. We ask that you follow-up with your pharmacy.

## 2024-03-06 ENCOUNTER — Other Ambulatory Visit: Payer: Self-pay | Admitting: Internal Medicine
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
## 2024-03-13 ENCOUNTER — Ambulatory Visit (INDEPENDENT_AMBULATORY_CARE_PROVIDER_SITE_OTHER): Payer: BC Managed Care – PPO | Admitting: Physician Assistant

## 2024-03-13 ENCOUNTER — Encounter: Payer: Self-pay | Admitting: Physician Assistant

## 2024-03-13 VITALS — BP 128/72 | HR 71 | Temp 98.3°F | Ht 71.0 in | Wt 206.0 lb

## 2024-03-13 DIAGNOSIS — E1140 Type 2 diabetes mellitus with diabetic neuropathy, unspecified: Secondary | ICD-10-CM | POA: Diagnosis not present

## 2024-03-13 DIAGNOSIS — F5101 Primary insomnia: Secondary | ICD-10-CM

## 2024-03-13 DIAGNOSIS — K219 Gastro-esophageal reflux disease without esophagitis: Secondary | ICD-10-CM

## 2024-03-13 DIAGNOSIS — I2510 Atherosclerotic heart disease of native coronary artery without angina pectoris: Secondary | ICD-10-CM

## 2024-03-13 DIAGNOSIS — Z794 Long term (current) use of insulin: Secondary | ICD-10-CM | POA: Diagnosis not present

## 2024-03-13 DIAGNOSIS — E039 Hypothyroidism, unspecified: Secondary | ICD-10-CM

## 2024-03-13 DIAGNOSIS — Z23 Encounter for immunization: Secondary | ICD-10-CM

## 2024-03-13 DIAGNOSIS — R1030 Lower abdominal pain, unspecified: Secondary | ICD-10-CM

## 2024-03-13 DIAGNOSIS — E782 Mixed hyperlipidemia: Secondary | ICD-10-CM | POA: Diagnosis not present

## 2024-03-13 DIAGNOSIS — I10 Essential (primary) hypertension: Secondary | ICD-10-CM | POA: Diagnosis not present

## 2024-03-13 DIAGNOSIS — E113511 Type 2 diabetes mellitus with proliferative diabetic retinopathy with macular edema, right eye: Secondary | ICD-10-CM

## 2024-03-13 MED ORDER — OZEMPIC (1 MG/DOSE) 4 MG/3ML ~~LOC~~ SOPN: 1.0000 mg | PEN_INJECTOR | SUBCUTANEOUS | 6 refills | Status: DC

## 2024-03-13 MED ORDER — TRESIBA FLEXTOUCH 100 UNIT/ML ~~LOC~~ SOPN: 40.0000 [IU] | PEN_INJECTOR | Freq: Every day | SUBCUTANEOUS | 6 refills | Status: DC

## 2024-03-13 MED ORDER — OMEPRAZOLE 40 MG PO CPDR: 40.0000 mg | DELAYED_RELEASE_CAPSULE | Freq: Every day | ORAL | 3 refills | Status: DC

## 2024-03-13 MED ORDER — VITAMIN D3 125 MCG (5000 UT) PO CAPS: 1.0000 | ORAL_CAPSULE | Freq: Every day | ORAL | 3 refills | Status: AC

## 2024-03-13 MED ORDER — ROSUVASTATIN CALCIUM 40 MG PO TABS: 40.0000 mg | ORAL_TABLET | Freq: Every day | ORAL | 0 refills | Status: DC

## 2024-03-13 MED ORDER — MELATONIN 10 MG PO CHEW: 1.0000 | CHEWABLE_TABLET | Freq: Every evening | ORAL | 3 refills | Status: AC | PRN

## 2024-03-13 MED ORDER — ASPIRIN 81 MG PO TBEC: 81.0000 mg | DELAYED_RELEASE_TABLET | Freq: Every day | ORAL | 2 refills | Status: DC

## 2024-03-13 MED ORDER — NOVOLOG FLEXPEN 100 UNIT/ML ~~LOC~~ SOPN: 5.0000 [IU] | PEN_INJECTOR | Freq: Three times a day (TID) | SUBCUTANEOUS | 1 refills | Status: AC

## 2024-03-13 MED ORDER — LEVOTHYROXINE SODIUM 125 MCG PO TABS: 125.0000 ug | ORAL_TABLET | Freq: Every day | ORAL | 0 refills | Status: DC

## 2024-03-13 MED ORDER — AMITRIPTYLINE HCL 25 MG PO TABS
25.0000 mg | ORAL_TABLET | Freq: Every day | ORAL | 2 refills | Status: DC
Start: 2024-03-13 — End: 2024-09-18

## 2024-03-13 MED ORDER — NITROGLYCERIN 0.4 MG SL SUBL: 0.4000 mg | SUBLINGUAL_TABLET | SUBLINGUAL | 3 refills | Status: DC | PRN

## 2024-03-13 NOTE — Assessment & Plan Note (Signed)
 Patient on statin therapy, recent lipid panel showed good control. -Order lipid panel. -If lipid panel remains favorable, consider tapering off statin therapy.

## 2024-03-19 ENCOUNTER — Other Ambulatory Visit: Payer: Self-pay | Admitting: Family Medicine

## 2024-03-19 DIAGNOSIS — E039 Hypothyroidism, unspecified: Secondary | ICD-10-CM

## 2024-03-19 LAB — COMPREHENSIVE METABOLIC PANEL WITH GFR
ALT: 17 IU/L (ref 0–44)
AST: 26 IU/L (ref 0–40)
Albumin: 4.8 g/dL (ref 3.9–4.9)
Alkaline Phosphatase: 78 IU/L (ref 44–121)
BUN/Creatinine Ratio: 17 (ref 10–24)
BUN: 21 mg/dL (ref 8–27)
Bilirubin Total: <0.2 mg/dL (ref 0.0–1.2)
CO2: 16 mmol/L — ABNORMAL LOW (ref 20–29)
Calcium: 10.3 mg/dL — ABNORMAL HIGH (ref 8.6–10.2)
Chloride: 106 mmol/L (ref 96–106)
Creatinine, Ser: 1.23 mg/dL (ref 0.76–1.27)
Globulin, Total: 2.7 g/dL (ref 1.5–4.5)
Glucose: 154 mg/dL — ABNORMAL HIGH (ref 70–99)
Potassium: 5.1 mmol/L (ref 3.5–5.2)
Sodium: 148 mmol/L — ABNORMAL HIGH (ref 134–144)
Total Protein: 7.5 g/dL (ref 6.0–8.5)
eGFR: 66 mL/min/{1.73_m2} (ref >59)

## 2024-03-19 LAB — CBC WITH DIFFERENTIAL/PLATELET
Basophils Absolute: 0.1 10*3/uL (ref 0.0–0.2)
Basos: 1 %
EOS (ABSOLUTE): 0.3 10*3/uL (ref 0.0–0.4)
Eos: 3 %
Hematocrit: 45.1 % (ref 37.5–51.0)
Hemoglobin: 14.2 g/dL (ref 13.0–17.7)
Immature Grans (Abs): 0 10*3/uL (ref 0.0–0.1)
Immature Granulocytes: 0 %
Lymphocytes Absolute: 2.1 10*3/uL (ref 0.7–3.1)
Lymphs: 27 %
MCH: 26.6 pg (ref 26.6–33.0)
MCHC: 31.5 g/dL (ref 31.5–35.7)
MCV: 85 fL (ref 79–97)
Monocytes Absolute: 1.1 10*3/uL — ABNORMAL HIGH (ref 0.1–0.9)
Monocytes: 13 %
Neutrophils Absolute: 4.5 10*3/uL (ref 1.4–7.0)
Neutrophils: 56 %
Platelets: 198 10*3/uL (ref 150–450)
RBC: 5.33 x10E6/uL (ref 4.14–5.80)
RDW: 14.5 % (ref 11.6–15.4)
WBC: 8 10*3/uL (ref 3.4–10.8)

## 2024-03-19 LAB — LIPID PANEL
Chol/HDL Ratio: 4.2 ratio (ref 0.0–5.0)
Cholesterol, Total: 144 mg/dL (ref 100–199)
HDL: 34 mg/dL — ABNORMAL LOW (ref >39)
LDL Chol Calc (NIH): 63 mg/dL (ref 0–99)
Triglycerides: 297 mg/dL — ABNORMAL HIGH (ref 0–149)
VLDL Cholesterol Cal: 47 mg/dL — ABNORMAL HIGH (ref 5–40)

## 2024-03-19 LAB — MICROALBUMIN / CREATININE URINE RATIO
Creatinine, Urine: 51.8 mg/dL
Microalb/Creat Ratio: 110 mg/g{creat} — ABNORMAL HIGH (ref 0–29)
Microalbumin, Urine: 56.8 ug/mL

## 2024-03-19 LAB — TSH: TSH: 22.5 u[IU]/mL — ABNORMAL HIGH (ref 0.450–4.500)

## 2024-03-19 LAB — HEMOGLOBIN A1C
Est. average glucose Bld gHb Est-mCnc: 212 mg/dL
Hgb A1c MFr Bld: 9 % — ABNORMAL HIGH (ref 4.8–5.6)

## 2024-03-19 MED ORDER — LEVOTHYROXINE SODIUM 150 MCG PO TABS: 150.0000 ug | ORAL_TABLET | Freq: Every day | ORAL | 11 refills | Status: DC

## 2024-03-20 ENCOUNTER — Other Ambulatory Visit: Payer: Self-pay

## 2024-03-20 DIAGNOSIS — Z23 Encounter for immunization: Secondary | ICD-10-CM | POA: Insufficient documentation

## 2024-03-20 DIAGNOSIS — R109 Unspecified abdominal pain: Secondary | ICD-10-CM | POA: Insufficient documentation

## 2024-03-20 DIAGNOSIS — F5101 Primary insomnia: Secondary | ICD-10-CM | POA: Insufficient documentation

## 2024-03-20 DIAGNOSIS — E039 Hypothyroidism, unspecified: Secondary | ICD-10-CM

## 2024-03-20 NOTE — Assessment & Plan Note (Signed)
 Medication refills managed. - Ensure medication refills are sent under current provider's name.

## 2024-03-20 NOTE — Assessment & Plan Note (Signed)
 Patient on Ozempic, Tresiba, and Novolog, with recent HbA1c of 6.7. Patient expresses desire to reduce medication use. -Order HbA1c. -If HbA1c remains below 7, consider reducing insulin therapy, starting with discontinuation of short-acting Novolog.

## 2024-03-20 NOTE — Assessment & Plan Note (Signed)
 On Synthroid. Medication refills managed. - Ensure medication refills are sent under current provider's name.

## 2024-03-20 NOTE — Assessment & Plan Note (Signed)
 Pain in lower abdomen, differential includes hernia and diverticulitis. History of diverticulitis and appendectomy. - Review CT scan results for hernia or diverticulitis. - Advise to report new symptoms such as fever, discoloration, or bulging.

## 2024-03-20 NOTE — Assessment & Plan Note (Signed)
 A1c at 7.9, higher than target. Issues with Kirk Ruths and Evaristo Bury refills due to insurance. Dexcom G6 effective, G7 less so due to adhesive issues. Cost concerns with Dexcom. Discussed Omnipod and [employer] as alternatives. - Order A1c test today. - Discuss Freestyle Libre as cost-effective alternative to [employer]. - Consider Omnipod if insurance and cost issues resolved. - Adjust insulin regimen if A1c >8.3 after test. - Send new prescriptions for all medications under current provider's name.

## 2024-03-20 NOTE — Assessment & Plan Note (Signed)
 Controlled Continue to monitor symptoms Will adjust treatment depending on symptoms

## 2024-04-02 ENCOUNTER — Other Ambulatory Visit: Payer: Self-pay | Admitting: Pharmacist

## 2024-04-04 DIAGNOSIS — E113512 Type 2 diabetes mellitus with proliferative diabetic retinopathy with macular edema, left eye: Secondary | ICD-10-CM | POA: Diagnosis not present
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
## 2024-04-05 ENCOUNTER — Other Ambulatory Visit: Payer: Self-pay | Admitting: Oncology

## 2024-04-05 ENCOUNTER — Inpatient Hospital Stay: Payer: BC Managed Care – PPO

## 2024-04-05 ENCOUNTER — Inpatient Hospital Stay: Payer: BC Managed Care – PPO | Admitting: Oncology

## 2024-04-05 DIAGNOSIS — C61 Malignant neoplasm of prostate: Secondary | ICD-10-CM
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
## 2024-04-11 ENCOUNTER — Inpatient Hospital Stay

## 2024-04-11 ENCOUNTER — Inpatient Hospital Stay (HOSPITAL_BASED_OUTPATIENT_CLINIC_OR_DEPARTMENT_OTHER): Admitting: Oncology

## 2024-04-11 ENCOUNTER — Inpatient Hospital Stay: Attending: Oncology

## 2024-04-11 ENCOUNTER — Encounter: Payer: Self-pay | Admitting: Oncology

## 2024-04-11 ENCOUNTER — Other Ambulatory Visit: Payer: Self-pay | Admitting: Oncology

## 2024-04-11 VITALS — BP 123/75 | HR 64 | Temp 98.0°F | Resp 18 | Ht 71.0 in | Wt 203.6 lb

## 2024-04-11 DIAGNOSIS — C61 Malignant neoplasm of prostate: Secondary | ICD-10-CM

## 2024-04-11 DIAGNOSIS — Z1509 Genetic susceptibility to other malignant neoplasm: Secondary | ICD-10-CM | POA: Diagnosis not present

## 2024-04-11 DIAGNOSIS — Z79899 Other long term (current) drug therapy: Secondary | ICD-10-CM | POA: Diagnosis not present

## 2024-04-11 DIAGNOSIS — Z1501 Genetic susceptibility to malignant neoplasm of breast: Secondary | ICD-10-CM | POA: Diagnosis not present

## 2024-04-11 DIAGNOSIS — Z5111 Encounter for antineoplastic chemotherapy: Secondary | ICD-10-CM | POA: Diagnosis not present

## 2024-04-11 LAB — PSA: Prostatic Specific Antigen: 0.01 ng/mL (ref 0.00–4.00)

## 2024-04-11 MED ORDER — LEUPROLIDE ACETATE (4 MONTH) 30 MG ~~LOC~~ KIT
30.0000 mg | PACK | Freq: Once | SUBCUTANEOUS | Status: AC
Start: 2024-04-11 — End: 2024-04-11
  Administered 2024-04-11: 30 mg via SUBCUTANEOUS
  Filled 2024-04-11: qty 30

## 2024-04-11 NOTE — Patient Instructions (Signed)

## 2024-04-23 ENCOUNTER — Encounter: Payer: Self-pay | Admitting: Oncology

## 2024-04-24 ENCOUNTER — Telehealth: Payer: Self-pay

## 2024-04-24 NOTE — Telephone Encounter (Signed)
-----   Message from Nolia Baumgartner sent at 04/23/2024  1:25 PM EDT ----- Regarding: call PSA remains zero, I assume he looked at MyChart?

## 2024-04-24 NOTE — Telephone Encounter (Signed)
 Attempted to contact patient. No answer.

## 2024-04-25 ENCOUNTER — Telehealth: Payer: Self-pay

## 2024-04-25 NOTE — Telephone Encounter (Signed)
-----   Message from Nolia Baumgartner sent at 04/23/2024  1:25 PM EDT ----- Regarding: call PSA remains zero, I assume he looked at MyChart?

## 2024-05-02 ENCOUNTER — Other Ambulatory Visit

## 2024-05-02 DIAGNOSIS — E039 Hypothyroidism, unspecified: Secondary | ICD-10-CM

## 2024-05-02 DIAGNOSIS — C61 Malignant neoplasm of prostate: Secondary | ICD-10-CM

## 2024-05-03 LAB — TSH: TSH: 1.04 u[IU]/mL (ref 0.450–4.500)

## 2024-05-03 LAB — T4, FREE: Free T4: 1.44 ng/dL (ref 0.82–1.77)

## 2024-05-07 ENCOUNTER — Ambulatory Visit: Payer: Self-pay | Admitting: Physician Assistant

## 2024-05-21 ENCOUNTER — Other Ambulatory Visit: Payer: Self-pay | Admitting: Internal Medicine

## 2024-05-21 ENCOUNTER — Other Ambulatory Visit: Payer: Self-pay | Admitting: Physician Assistant

## 2024-05-21 MED ORDER — PREGABALIN 100 MG PO CAPS: 100.0000 mg | ORAL_CAPSULE | Freq: Three times a day (TID) | ORAL | 2 refills | Status: DC

## 2024-05-21 NOTE — Telephone Encounter (Deleted)
 Adam Cruz

## 2024-05-21 NOTE — Telephone Encounter (Signed)
 Copied from CRM [id number]. Topic: Clinical - Medication Refill >> May 21, 2024  9:10 AM Ivette P wrote: Medication: pregabalin  (LYRICA ) 100 MG capsule  Has the patient contacted their pharmacy? Yes (Agent: If no, request that the patient contact the pharmacy for the refill. If patient does not wish to contact the pharmacy document the reason why and proceed with request.) (Agent: If yes, when and what did the pharmacy advise?)  This is the patient's preferred pharmacy:  [id number], [place] - [address] [place] [address] Phone: [phone] Fax: [phone]   Is this the correct pharmacy for this prescription? Yes If no, delete pharmacy and type the correct one.   Has the prescription been filled recently? No, 02/16/2024  Is the patient out of the medication? Yes, pt only has 3 pills left   Has the patient been seen for an appointment in the last year OR does the patient have an upcoming appointment? Yes, 06/17/2024  Can we respond through MyChart? Yes  Agent: Please be advised that Rx refills may take up to 3 business days. We ask that you follow-up with your pharmacy.
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
## 2024-06-17 ENCOUNTER — Encounter: Payer: Self-pay | Admitting: Physician Assistant

## 2024-06-17 ENCOUNTER — Ambulatory Visit (INDEPENDENT_AMBULATORY_CARE_PROVIDER_SITE_OTHER): Admitting: Physician Assistant

## 2024-06-17 VITALS — BP 104/60 | HR 74 | Temp 97.8°F | Resp 18 | Ht 71.0 in | Wt 205.0 lb

## 2024-06-17 DIAGNOSIS — E1140 Type 2 diabetes mellitus with diabetic neuropathy, unspecified: Secondary | ICD-10-CM

## 2024-06-17 DIAGNOSIS — E039 Hypothyroidism, unspecified: Secondary | ICD-10-CM

## 2024-06-17 DIAGNOSIS — I10 Essential (primary) hypertension: Secondary | ICD-10-CM | POA: Diagnosis not present

## 2024-06-17 DIAGNOSIS — K219 Gastro-esophageal reflux disease without esophagitis: Secondary | ICD-10-CM | POA: Diagnosis not present

## 2024-06-17 DIAGNOSIS — Z794 Long term (current) use of insulin: Secondary | ICD-10-CM

## 2024-06-17 DIAGNOSIS — E782 Mixed hyperlipidemia: Secondary | ICD-10-CM | POA: Diagnosis not present

## 2024-06-17 NOTE — Assessment & Plan Note (Signed)
 On levothyroxine . Issues with pharmacy refills and insurance fraud concerns. - Order thyroid  function tests.

## 2024-06-17 NOTE — Assessment & Plan Note (Signed)
 Controlled Labs drawn today Continue to take Crestor  40mg  Will adjust treatment based on labs Lab Results  Component Value Date   LDLCALC 63 03/13/2024

## 2024-06-17 NOTE — Assessment & Plan Note (Signed)
 Type 2 diabetes with morning glucose 170s-180s. Peripheral neuropathy improved with Lyrica  100 mg. Synjardy  and Tresiba  effective. Addressed misinformation about diabetes cures online. Explained insulin  resistance requires medication. Emphasized glycemic control to reduce future medication dependency. - Order A1c test. - Evaluate thyroid  function with blood tests. - Consider adjusting Tresiba  based on A1c. - Continue Lyrica . - Continue Synjardy  and Tresiba . - Advise caution regarding online misinformation.

## 2024-06-17 NOTE — Assessment & Plan Note (Signed)
 Controlled Continue to monitor BP at home. Continue taking aspirin  81mg  as prescribed BP Readings from Last 3 Encounters:  06/17/24 104/60  04/11/24 123/75  03/13/24 128/72

## 2024-06-17 NOTE — Assessment & Plan Note (Signed)
 Controlled Continue to monitor symptoms Continue taking prilosec 40mg  as prescribed

## 2024-06-17 NOTE — Patient Instructions (Signed)
 VISIT SUMMARY:  Today, we discussed your challenges with managing your blood sugar levels and the issues with your current medications. We also addressed your concerns about burning and tingling sensations in your foot, and your frustrations with pharmacy and insurance issues. We reviewed your current treatment plan and made some adjustments to better manage your diabetes and overall health.  YOUR PLAN:  -TYPE 2 DIABETES MELLITUS: Type 2 diabetes is a condition where your body does not use insulin  properly, leading to high blood sugar levels. We will continue your current medications, Synjardy  and Tresiba , as they are effective. We will also order an A1c test to check your average blood sugar levels over the past three months and evaluate your thyroid  function with blood tests. Based on the A1c results, we may adjust your Tresiba  dosage. Continue taking Lyrica  for the burning and tingling sensations in your foot, which have improved. Be cautious about misinformation online regarding diabetes cures, as managing insulin  resistance requires medication.  -HYPOTHYROIDISM: Hypothyroidism is a condition where your thyroid  does not produce enough hormones, leading to a slow metabolism. You are currently on levothyroxine  for this condition. We will order thyroid  function tests to ensure your medication is working properly. Continue to manage your medication refills and be vigilant about insurance issues.  -GENERAL HEALTH MAINTENANCE: You are managing multiple medications and have been proactive with your pharmacy and insurance issues. We discussed the option of using a mail-order pharmacy to help with medication refills and reduce dependency on local pharmacies. Continue to stay informed and cautious about online information regarding your health.  INSTRUCTIONS:  We will order an A1c test and thyroid  function tests. Based on the results, we may adjust your Tresiba  dosage. Continue taking your current  medications and be vigilant about managing your refills and insurance issues. Consider using a mail-order pharmacy for more consistent medication management.

## 2024-06-18 LAB — COMPREHENSIVE METABOLIC PANEL WITH GFR
ALT: 12 IU/L (ref 0–44)
AST: 18 IU/L (ref 0–40)
Albumin: 4.2 g/dL (ref 3.9–4.9)
Alkaline Phosphatase: 74 IU/L (ref 44–121)
BUN/Creatinine Ratio: 20 (ref 10–24)
BUN: 20 mg/dL (ref 8–27)
Bilirubin Total: 0.3 mg/dL (ref 0.0–1.2)
CO2: 20 mmol/L (ref 20–29)
Calcium: 9.9 mg/dL (ref 8.6–10.2)
Chloride: 102 mmol/L (ref 96–106)
Creatinine, Ser: 1.01 mg/dL (ref 0.76–1.27)
Globulin, Total: 2.4 g/dL (ref 1.5–4.5)
Glucose: 226 mg/dL — ABNORMAL HIGH (ref 70–99)
Potassium: 4.3 mmol/L (ref 3.5–5.2)
Sodium: 139 mmol/L (ref 134–144)
Total Protein: 6.6 g/dL (ref 6.0–8.5)
eGFR: 84 mL/min/{1.73_m2} (ref >59)

## 2024-06-18 LAB — CBC WITH DIFFERENTIAL/PLATELET
Basophils Absolute: 0.1 10*3/uL (ref 0.0–0.2)
Basos: 1 %
EOS (ABSOLUTE): 0.5 10*3/uL — ABNORMAL HIGH (ref 0.0–0.4)
Eos: 7 %
Hematocrit: 43.3 % (ref 37.5–51.0)
Hemoglobin: 13.2 g/dL (ref 13.0–17.7)
Immature Grans (Abs): 0 10*3/uL (ref 0.0–0.1)
Immature Granulocytes: 0 %
Lymphocytes Absolute: 2.2 10*3/uL (ref 0.7–3.1)
Lymphs: 27 %
MCH: 26.6 pg (ref 26.6–33.0)
MCHC: 30.5 g/dL — ABNORMAL LOW (ref 31.5–35.7)
MCV: 87 fL (ref 79–97)
Monocytes Absolute: 0.7 10*3/uL (ref 0.1–0.9)
Monocytes: 9 %
Neutrophils Absolute: 4.7 10*3/uL (ref 1.4–7.0)
Neutrophils: 56 %
Platelets: 201 10*3/uL (ref 150–450)
RBC: 4.97 x10E6/uL (ref 4.14–5.80)
RDW: 14.7 % (ref 11.6–15.4)
WBC: 8.3 10*3/uL (ref 3.4–10.8)

## 2024-06-18 LAB — LIPID PANEL
Chol/HDL Ratio: 4.5 ratio (ref 0.0–5.0)
Cholesterol, Total: 143 mg/dL (ref 100–199)
HDL: 32 mg/dL — ABNORMAL LOW (ref >39)
LDL Chol Calc (NIH): 61 mg/dL (ref 0–99)
Triglycerides: 315 mg/dL — ABNORMAL HIGH (ref 0–149)
VLDL Cholesterol Cal: 50 mg/dL — ABNORMAL HIGH (ref 5–40)

## 2024-06-18 LAB — TSH: TSH: 0.234 u[IU]/mL — ABNORMAL LOW (ref 0.450–4.500)

## 2024-06-18 LAB — HEMOGLOBIN A1C
Est. average glucose Bld gHb Est-mCnc: 174 mg/dL
Hgb A1c MFr Bld: 7.7 % — ABNORMAL HIGH (ref 4.8–5.6)

## 2024-06-20 ENCOUNTER — Ambulatory Visit: Payer: Self-pay | Admitting: Physician Assistant

## 2024-06-20 DIAGNOSIS — E039 Hypothyroidism, unspecified: Secondary | ICD-10-CM

## 2024-06-20 MED ORDER — LEVOTHYROXINE SODIUM 125 MCG PO TABS: 125.0000 ug | ORAL_TABLET | Freq: Every day | ORAL | 3 refills | Status: DC

## 2024-06-24 ENCOUNTER — Other Ambulatory Visit: Payer: Self-pay | Admitting: Physician Assistant

## 2024-06-24 DIAGNOSIS — K219 Gastro-esophageal reflux disease without esophagitis: Secondary | ICD-10-CM

## 2024-06-25 ENCOUNTER — Ambulatory Visit (INDEPENDENT_AMBULATORY_CARE_PROVIDER_SITE_OTHER)
Admission: RE | Admit: 2024-06-25 | Discharge: 2024-06-25 | Disposition: A | Discharge status: Home / Self Care | Source: Ambulatory Visit | Attending: Physician Assistant | Admitting: Physician Assistant

## 2024-06-25 DIAGNOSIS — E041 Nontoxic single thyroid nodule: Secondary | ICD-10-CM | POA: Diagnosis not present

## 2024-06-25 DIAGNOSIS — E039 Hypothyroidism, unspecified: Secondary | ICD-10-CM | POA: Diagnosis not present

## 2024-06-28 ENCOUNTER — Ambulatory Visit: Payer: Self-pay | Admitting: Physician Assistant

## 2024-07-09 ENCOUNTER — Other Ambulatory Visit: Payer: Self-pay | Admitting: Physician Assistant

## 2024-08-09 ENCOUNTER — Encounter: Payer: Self-pay | Admitting: Oncology

## 2024-08-09 ENCOUNTER — Inpatient Hospital Stay: Attending: Hematology and Oncology | Admitting: Hematology and Oncology

## 2024-08-09 ENCOUNTER — Telehealth: Payer: Self-pay | Admitting: Hematology and Oncology

## 2024-08-09 ENCOUNTER — Inpatient Hospital Stay

## 2024-08-09 ENCOUNTER — Other Ambulatory Visit (HOSPITAL_COMMUNITY): Payer: Self-pay

## 2024-08-09 ENCOUNTER — Other Ambulatory Visit: Payer: Self-pay | Admitting: Hematology and Oncology

## 2024-08-09 ENCOUNTER — Telehealth: Payer: Self-pay

## 2024-08-09 ENCOUNTER — Other Ambulatory Visit: Payer: Self-pay

## 2024-08-09 VITALS — BP 128/78 | HR 64 | Temp 98.0°F | Resp 14 | Ht 71.0 in | Wt 209.2 lb

## 2024-08-09 DIAGNOSIS — C61 Malignant neoplasm of prostate: Secondary | ICD-10-CM | POA: Diagnosis not present

## 2024-08-09 DIAGNOSIS — Z79899 Other long term (current) drug therapy: Secondary | ICD-10-CM | POA: Diagnosis not present

## 2024-08-09 DIAGNOSIS — Z5111 Encounter for antineoplastic chemotherapy: Secondary | ICD-10-CM | POA: Insufficient documentation

## 2024-08-09 LAB — CBC WITH DIFFERENTIAL (CANCER CENTER ONLY)
Abs Immature Granulocytes: 0.02 K/uL (ref 0.00–0.07)
Basophils Absolute: 0.1 K/uL (ref 0.0–0.1)
Basophils Relative: 1 %
Eosinophils Absolute: 0.3 K/uL (ref 0.0–0.5)
Eosinophils Relative: 4 %
HCT: 42.4 % (ref 39.0–52.0)
Hemoglobin: 13.4 g/dL (ref 13.0–17.0)
Immature Granulocytes: 0 %
Lymphocytes Relative: 31 %
Lymphs Abs: 2.4 K/uL (ref 0.7–4.0)
MCH: 26.6 pg (ref 26.0–34.0)
MCHC: 31.6 g/dL (ref 30.0–36.0)
MCV: 84.1 fL (ref 80.0–100.0)
Monocytes Absolute: 0.7 K/uL (ref 0.1–1.0)
Monocytes Relative: 9 %
Neutro Abs: 4.3 K/uL (ref 1.7–7.7)
Neutrophils Relative %: 55 %
Platelet Count: 182 K/uL (ref 150–400)
RBC: 5.04 MIL/uL (ref 4.22–5.81)
RDW: 14.8 % (ref 11.5–15.5)
WBC Count: 7.8 K/uL (ref 4.0–10.5)
nRBC: 0 % (ref 0.0–0.2)

## 2024-08-09 LAB — CMP (CANCER CENTER ONLY)
ALT: 17 U/L (ref 0–44)
AST: 20 U/L (ref 15–41)
Albumin: 4.4 g/dL (ref 3.5–5.0)
Alkaline Phosphatase: 69 U/L (ref 38–126)
Anion gap: 12 (ref 5–15)
BUN: 18 mg/dL (ref 8–23)
CO2: 24 mmol/L (ref 22–32)
Calcium: 9.9 mg/dL (ref 8.9–10.3)
Chloride: 103 mmol/L (ref 98–111)
Creatinine: 1.07 mg/dL (ref 0.61–1.24)
GFR, Estimated: >60 mL/min (ref >60)
Glucose, Bld: 220 mg/dL — ABNORMAL HIGH (ref 70–99)
Potassium: 4.5 mmol/L (ref 3.5–5.1)
Sodium: 139 mmol/L (ref 135–145)
Total Bilirubin: 0.4 mg/dL (ref 0.0–1.2)
Total Protein: 7.4 g/dL (ref 6.5–8.1)

## 2024-08-09 MED ORDER — LEUPROLIDE ACETATE (4 MONTH) 30 MG ~~LOC~~ KIT
30.0000 mg | PACK | Freq: Once | SUBCUTANEOUS | Status: AC
  Administered 2024-08-09: 30 mg via SUBCUTANEOUS
  Filled 2024-08-09: qty 30

## 2024-08-09 MED ORDER — OLAPARIB 150 MG PO TABS: 300.0000 mg | ORAL_TABLET | Freq: Two times a day (BID) | ORAL | 0 refills | Status: DC
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
## 2024-08-09 NOTE — Telephone Encounter (Signed)
 Oral Oncology Patient Advocate Encounter   Received notification that prior authorization for olaparib  (LYNPARZA ) 150mg  is required.   PA submitted on 08/09/24 Key [id number] Status is pending     Lucie Lamer, CPhT [place]  [facility] Pharmacy Technician Patient Advocate Specialist [address] [place] Phone: [phone]  Fax: [phone] [email or web address]

## 2024-08-09 NOTE — Telephone Encounter (Signed)
 Patient has been scheduled for follow-up visit per 08/09/24 LOS.  Pt given an appt calendar with date and time.

## 2024-08-12 NOTE — Telephone Encounter (Signed)
 Oral Oncology Patient Advocate Encounter  Med not covered for disease state, dr is going to change.  Lucie Lamer, CPhT [place]  [facility] Pharmacy Technician Patient Advocate Specialist II [place] Phone: [phone]  Fax: [phone] [email or web address]

## 2024-08-22 ENCOUNTER — Other Ambulatory Visit: Payer: Self-pay | Admitting: Physician Assistant

## 2024-08-22 NOTE — Telephone Encounter (Unsigned)
 Copied from CRM [id number]. Topic: Clinical - Medication Refill >> Aug 22, 2024  4:09 PM Ivette P wrote: Medication: pregabalin  (LYRICA ) 100 MG capsule  Has the patient contacted their pharmacy? Yes (Agent: If no, request that the patient contact the pharmacy for the refill. If patient does not wish to contact the pharmacy document the reason why and proceed with request.) (Agent: If yes, when and what did the pharmacy advise?)  This is the patient's preferred pharmacy:  [id number], [place] - [address] [place]  [address] Phone: [phone] Fax: [phone]  Is this the correct pharmacy for this prescription? Yes If no, delete pharmacy and type the correct one.   Has the prescription been filled recently? No  Is the patient out of the medication? Yes  Has the patient been seen for an appointment in the last year OR does the patient have an upcoming appointment? Yes 09/18/2024  Can we respond through MyChart? Yes  Agent: Please be advised that Rx refills may take up to 3 business days. We ask that you follow-up with your pharmacy.

## 2024-08-23 ENCOUNTER — Other Ambulatory Visit: Payer: Self-pay | Admitting: Physician Assistant

## 2024-08-26 ENCOUNTER — Other Ambulatory Visit: Payer: Self-pay | Admitting: Physician Assistant

## 2024-08-26 ENCOUNTER — Other Ambulatory Visit: Payer: Self-pay

## 2024-08-26 ENCOUNTER — Ambulatory Visit: Payer: Self-pay

## 2024-08-26 MED ORDER — PREGABALIN 100 MG PO CAPS: 100.0000 mg | ORAL_CAPSULE | Freq: Three times a day (TID) | ORAL | 2 refills | Status: DC

## 2024-08-26 NOTE — Telephone Encounter (Unsigned)
 Copied from CRM [id number]. Topic: Clinical - Prescription Issue >> Aug 26, 2024  3:03 PM Donee H wrote: Reason for CRM: Patient calling regarding medication pregabalin  (LYRICA ) 100 MG capsule. He states just received a text message from pharmacy stating refill was declined. He wants to know why. He states he is out of medication. Please follow up with patient as soon as possible  [phone]

## 2024-08-26 NOTE — Telephone Encounter (Signed)
 FYI Only or Action Required?: Action required by provider: patient is needing Pregabalin  refill completed. Has been without medication since Thursday .  Patient was last seen in primary care on 06/17/2024 by Milon Cleaves, PA.  Called Nurse Triage reporting Medication Problem.  Symptoms began several days ago.  Interventions attempted: Nothing.  Symptoms are: gradually worsening.  Triage Disposition: Call PCP Now-call placed to CAL  Patient/caregiver understands and will follow disposition?: Yes  Copied from CRM [id number]. Topic: Clinical - Prescription Issue >> Aug 26, 2024  2:28 PM Wess RAMAN wrote: Reason for CRM: Patient has not received his pregabalin  (LYRICA ) 100 MG capsule. His feet are killing him and he can barely walk from the neuropathy pain  Pharmacy: [facility] #[id number] [facility], [place] - [address] [place] [address] Phone: [phone] Fax: [phone] Hours: Not open 24 hours Reason for Disposition  [1] Caller has URGENT medicine question about med that primary care doctor (or NP/PA) or specialist prescribed AND [2] triager unable to answer question  Answer Assessment - Initial Assessment Questions 1. NAME of MEDICINE: What medicine(s) are you calling about?     Lyrica  2. QUESTION: What is your question? (e.g., double dose of medicine, side effect)     Patient has been out of Lyrica  since Thursday. Patient has placed two refill requests about this medication.  3. PRESCRIBER: Who prescribed the medicine? Reason: if prescribed by specialist, call should be referred to that group.     Cleaves Milon PA 4. SYMPTOMS: Do you have any symptoms? If Yes, ask: What symptoms are you having?  How bad are the symptoms (e.g., mild, moderate, severe)     Neuropathy pain to feet-10 out of 10  Protocols used: Medication Question Call-A-AH

## 2024-08-27 ENCOUNTER — Encounter: Payer: Self-pay | Admitting: Hematology and Oncology

## 2024-09-03 ENCOUNTER — Telehealth: Payer: Self-pay

## 2024-09-03 NOTE — Telephone Encounter (Signed)
 Notified MD that we will remove the olaparib  from the patients medication list as patient is not eligible for this medication at this time for his prostate cancer. Discussed with MD and at this time oral oncology will sign off.   Arisbel Maione, PharmD Hematology/Oncology Clinical Pharmacist [facility] [phone]

## 2024-09-11 ENCOUNTER — Other Ambulatory Visit: Payer: Self-pay | Admitting: Medical Genetics

## 2024-09-12 NOTE — Progress Notes (Signed)
 Subjective:  Patient ID: Adam Cruz, male    DOB: 1961-10-03  Age: 63 y.o. MRN: [id number]  Chief Complaint  Patient presents with   Medical Management of Chronic Issues   HPI:   Diabetes: Patient takes tresiba  40 units daily, synjardy  12.04-999 2 tablets daily, Ozempic  1 mg weekly, Novolog  inject 5  units into TID.   GERD: Takes Omeprazole  40 mg daily.   Hypothyroidism: On levothyroxine  150 mcg daily.  Discussed the use of AI scribe software for clinical note transcription with the patient, who gave verbal consent to proceed.  History of Present Illness Adam Cruz is a 63 year old male with diabetes who presents with medication management issues and fatigue.  He is experiencing significant issues with his current pharmacy, [facility], particularly with medication refills for pregabalin , which he uses for burning sensations. A delay in receiving his medication resulted in a weekend without it, during which he experienced severe burning.   He has been attending [employer] for the past six to seven weeks, following a period where he was unable to walk due to cancer treatments with Zytiga , which affected his balance and required the use of a cane. He is now able to walk two and a half miles in an hour on a treadmill, although he notes weight gain from below 200 pounds to 210 pounds. He feels generally well and attributes some improvement to his increased physical activity.  He experiences fatigue, particularly falling asleep when sitting still, which he attributes to possibly being a vitamin deficiency. He had a sleep study 10-12 years ago that indicated sleep apnea, but he does not use a CPAP machine due to discomfort. His wife notes that he snores occasionally but not consistently.  He also uses Tresiba  and Synjardy , and has one more pen of Tresiba  left. He has not used Novolog  recently but has it available for high blood sugar levels.  He has periodontal disease with  three loose teeth, causing difficulty in eating. He is scheduled for extraction of these teeth later today. He hopes this will allow him to eat better and improve his diet, which he admits has not been ideal due to dental issues.  He reports improvement in constipation since starting regular walking, although he sometimes needs to interrupt his walk to use the bathroom. No pain, blood, or mucus in stool.         09/18/2024    7:41 AM 08/09/2024    8:00 AM 06/17/2024    7:32 AM 03/13/2024    8:30 AM 12/14/2023    2:24 PM  Depression screen PHQ 2/9  Decreased Interest 0 0 0 0 0  Down, Depressed, Hopeless 0 0 0 0 0  PHQ - 2 Score 0 0 0 0 0  Altered sleeping 2  0 2 0  Tired, decreased energy 1  0 2 1  Change in appetite 0  0 0 0  Feeling bad or failure about yourself  0  0 0 0  Trouble concentrating 0  0 0 1  Moving slowly or fidgety/restless 0  0 0 0  Suicidal thoughts 0  0 0 0  PHQ-9 Score 3  0 4 2  Difficult doing work/chores Not difficult at all  Not difficult at all Not difficult at all Somewhat difficult        06/17/2024    7:32 AM  Fall Risk   Falls in the past year? 0  Number falls in past yr: 0  Injury with Fall? 0  Risk for fall due to : No Fall Risks  Follow up Falls evaluation completed    Patient Care Team: Milon Cleaves, [place] as PCP - General (Physician Assistant) Revankar, Jennifer SAUNDERS, [place] as PCP - Cardiology (Cardiology)   Review of Systems  Constitutional:  Negative for chills, fatigue and fever.  HENT:  Negative for congestion, ear pain and sore throat.   Respiratory:  Negative for cough and shortness of breath.   Cardiovascular:  Negative for chest pain and palpitations.  Gastrointestinal:  Negative for abdominal pain, constipation, diarrhea, nausea and vomiting.  Genitourinary:  Negative for difficulty urinating and dysuria.  Musculoskeletal:  Negative for arthralgias, back pain and myalgias.  Skin:  Negative for rash.  Neurological:  Negative for dizziness and  headaches.  Psychiatric/Behavioral:  Negative for dysphoric mood.     Current Outpatient Medications on File Prior to Visit  Medication Sig Dispense Refill   Cholecalciferol (VITAMIN D3) 125 MCG (5000 UT) CAPS Take 1 capsule (5,000 Units total) by mouth daily. 30 capsule 3   Melatonin 10 MG CHEW Chew 1 tablet by mouth at bedtime as needed. 30 tablet 3   NOVOLOG  FLEXPEN 100 UNIT/ML FlexPen Inject 5 Units into the skin with breakfast, with lunch, and with evening meal. 15 mL 1   omeprazole  (PRILOSEC) 40 MG capsule TAKE 1 CAPSULE(40 MG) BY MOUTH DAILY 90 capsule 0   No current facility-administered medications on file prior to visit.   Past Medical History:  Diagnosis Date   Acute pain due to injury 10/04/2016   Atherosclerotic heart disease of native coronary artery with other forms of angina pectoris    BRCA2 positive 09/08/2017   Burn any degree involving less than 10 percent of body surface 10/04/2016   CAD (coronary artery disease) 10/04/2013   Cardiac murmur 06/06/2022   COPD with acute bronchitis (HCC) 03/01/2019   Coronary artery disease of native artery of native heart with stable angina pectoris    Cough    Diabetes mellitus (HCC) 08/02/2013   Family history of breast cancer    Family history of breast cancer in male    Family history of pancreatic cancer    Genetic testing 09/08/2017   BRCA2 c.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  The Hereditary Gene Panel offered by [facility] includes sequencing and/or deletion duplication testing of the following 46 genes: APC, ATM, AXIN2, BARD1, BMPR1A, BRCA1, BRCA2, BRIP1, CDH1, CDKN2A (p14ARF), CDKN2A (p16INK4a), CHEK2, CTNNA1, DICER1, EPCAM (Deletion/duplication testing only), G   Hyperlipidemia    Hypertension    Hypokalemia 03/01/2019   Hypothyroidism    Malignant neoplasm of prostate (HCC) 07/28/2017   MI (myocardial infarction) (HCC) 2014   Mixed hyperlipidemia     Type 2 diabetes mellitus with right eye affected by proliferative retinopathy and macular edema, with long-term current use of insulin  (HCC)    Upper respiratory tract infection    Past Surgical History:  Procedure Laterality Date   APPENDECTOMY     CARDIAC CATHETERIZATION  2014   CATARACT EXTRACTION Right 02/12/2019   PROSTATE BIOPSY     RETINAL DETACHMENT SURGERY     Pneumatic retinopexy   SHOULDER SURGERY      Family History  Problem Relation Age of Onset   Breast cancer Mother 61   Pancreatic cancer Mother 42   Breast cancer Maternal Grandfather        dx in his 74s   Breast cancer Daughter 31  reportedly BRCA pos   Cirrhosis Father 49       non alcoholic cirrhosis   Dementia Maternal Grandmother    Dementia Paternal Aunt    Social History   Socioeconomic History   Marital status: Married    Spouse name: Not on file   Number of children: Not on file   Years of education: Not on file   Highest education level: 12th grade  Occupational History   Not on file  Tobacco Use   Smoking status: Never   Smokeless tobacco: Never  Vaping Use   Vaping status: Never Used  Substance and Sexual Activity   Alcohol  use: Never   Drug use: Never   Sexual activity: Yes  Other Topics Concern   Not on file  Social History Narrative   Not on file   Social Drivers of Health   Financial Resource Strain: Low Risk  (06/13/2024)   Overall Financial Resource Strain (CARDIA)    Difficulty of Paying Living Expenses: Not very hard  Food Insecurity: No Food Insecurity (06/13/2024)   Hunger Vital Sign    Worried About Running Out of Food in the Last Year: Never true    Ran Out of Food in the Last Year: Never true  Transportation Needs: No Transportation Needs (06/13/2024)   PRAPARE - Administrator, Civil Service (Medical): No    Lack of Transportation (Non-Medical): No  Physical Activity: Insufficiently Active (06/13/2024)   Exercise Vital Sign    Days of Exercise per  Week: 3 days    Minutes of Exercise per Session: 30 min  Stress: No Stress Concern Present (06/13/2024)   [employer] of Occupational Health - Occupational Stress Questionnaire    Feeling of Stress: Not at all  Social Connections: Socially Integrated (06/13/2024)   Social Connection and Isolation Panel    Frequency of Communication with Friends and Family: More than three times a week    Frequency of Social Gatherings with Friends and Family: Three times a week    Attends Religious Services: More than 4 times per year    Active Member of Clubs or Organizations: Yes    Attends Banker Meetings: 1 to 4 times per year    Marital Status: Married    Objective:  BP 120/64   Pulse 72   Temp 98.2 F (36.8 C)   Ht 5' 11 (1.803 m)   Wt 210 lb (95.3 kg)   SpO2 96%   BMI 29.29 kg/m      09/18/2024    7:39 AM 08/09/2024    8:34 AM 06/17/2024    7:34 AM  BP/Weight  Systolic BP 120 128 104  Diastolic BP 64 78 60  Wt. (Lbs) 210 209.2 205  BMI 29.29 kg/m2 29.18 kg/m2 28.59 kg/m2    Physical Exam Vitals reviewed.  Constitutional:      Appearance: Normal appearance.  Neck:     Vascular: No carotid bruit.  Cardiovascular:     Rate and Rhythm: Normal rate and regular rhythm.     Heart sounds: Normal heart sounds.  Pulmonary:     Effort: Pulmonary effort is normal.     Breath sounds: Normal breath sounds.  Abdominal:     General: Bowel sounds are normal.     Palpations: Abdomen is soft.     Tenderness: There is no abdominal tenderness.  Neurological:     Mental Status: He is alert and oriented to person, place, and time.  Psychiatric:  Mood and Affect: Mood normal.        Behavior: Behavior normal.         Lab Results  Component Value Date   WBC 7.8 08/09/2024   HGB 13.4 08/09/2024   HCT 42.4 08/09/2024   PLT 182 08/09/2024   GLUCOSE 220 (H) 08/09/2024   CHOL 143 06/17/2024   TRIG 315 (H) 06/17/2024   HDL 32 (L) 06/17/2024   LDLCALC 61  06/17/2024   ALT 17 08/09/2024   AST 20 08/09/2024   NA 139 08/09/2024   K 4.5 08/09/2024   CL 103 08/09/2024   CREATININE 1.07 08/09/2024   BUN 18 08/09/2024   CO2 24 08/09/2024   TSH 0.234 (L) 06/17/2024   PSA <0.1 11/22/2023   INR 1.1 03/01/2019   HGBA1C 7.7 (H) 06/17/2024    Results for orders placed or performed in visit on 08/09/24  CMP (Cancer Center only)   Collection Time: 08/09/24  8:05 AM  Result Value Ref Range   Sodium 139 135 - 145 mmol/L   Potassium 4.5 3.5 - 5.1 mmol/L   Chloride 103 98 - 111 mmol/L   CO2 24 22 - 32 mmol/L   Glucose, Bld 220 (H) 70 - 99 mg/dL   BUN 18 8 - 23 mg/dL   Creatinine 8.92 9.38 - 1.24 mg/dL   Calcium  9.9 8.9 - 10.3 mg/dL   Total Protein 7.4 6.5 - 8.1 g/dL   Albumin 4.4 3.5 - 5.0 g/dL   AST 20 15 - 41 U/L   ALT 17 0 - 44 U/L   Alkaline Phosphatase 69 38 - 126 U/L   Total Bilirubin 0.4 0.0 - 1.2 mg/dL   GFR, Estimated >39 >39 mL/min   Anion gap 12 5 - 15  CBC with Differential (Cancer Center Only)   Collection Time: 08/09/24  8:05 AM  Result Value Ref Range   WBC Count 7.8 4.0 - 10.5 K/uL   RBC 5.04 4.22 - 5.81 MIL/uL   Hemoglobin 13.4 13.0 - 17.0 g/dL   HCT 57.5 60.9 - 47.9 %   MCV 84.1 80.0 - 100.0 fL   MCH 26.6 26.0 - 34.0 pg   MCHC 31.6 30.0 - 36.0 g/dL   RDW 85.1 88.4 - 84.4 %   Platelet Count 182 150 - 400 K/uL   nRBC 0.0 0.0 - 0.2 %   Neutrophils Relative % 55 %   Neutro Abs 4.3 1.7 - 7.7 K/uL   Lymphocytes Relative 31 %   Lymphs Abs 2.4 0.7 - 4.0 K/uL   Monocytes Relative 9 %   Monocytes Absolute 0.7 0.1 - 1.0 K/uL   Eosinophils Relative 4 %   Eosinophils Absolute 0.3 0.0 - 0.5 K/uL   Basophils Relative 1 %   Basophils Absolute 0.1 0.0 - 0.1 K/uL   Immature Granulocytes 0 %   Abs Immature Granulocytes 0.02 0.00 - 0.07 K/uL  .  Assessment & Plan:   Assessment & Plan Type 2 diabetes mellitus with diabetic neuropathy, with long-term current use of insulin  (HCC) Type 2 diabetes mellitus Managed with  Ozempic , Synjardy , and Tresiba . 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 Interested in A1c monitoring. - Order A1c test. - Transfer Synjardy  and Tresiba  prescriptions to community pharmacy at the med center. - Continue Ozempic  at [facility] due to supply issues at smaller pharmacies. - Consider G7 or Libre device if insurance coverage improves. Orders:   Hemoglobin A1c   pregabalin  (LYRICA ) 100 MG capsule; Take 1 capsule (100 mg  total) by mouth 3 (three) times daily.   aspirin  EC (ASPIRIN  LOW DOSE) 81 MG tablet; Take 1 tablet (81 mg total) by mouth daily.   TRESIBA  FLEXTOUCH 100 UNIT/ML FlexTouch Pen; Inject 40 Units into the skin daily.   Semaglutide , 1 MG/DOSE, (OZEMPIC , 1 MG/DOSE,) 4 MG/3ML SOPN; Inject 1 mg into the skin once a week.  Acquired hypothyroidism Hypothyroidism Managed with Synthroid  125 mcg. Currently stable with increased physical activity. - Continue Synthroid  125 mcg. - Monitor thyroid  function tests. Orders:   CBC with Differential/Platelet   T4, free   TSH   levothyroxine  (SYNTHROID ) 125 MCG tablet; Take 1 tablet (125 mcg total) by mouth daily.  Coronary artery disease involving native coronary artery of native heart without angina pectoris Controlled Continue to monitor diet and exercise Labs drawn today Will adjust treatment depending on results Continue taking Crestor  40mg  as prescribed Orders:   nitroGLYCERIN  (NITROSTAT ) 0.4 MG SL tablet; Place 1 tablet (0.4 mg total) under the tongue every 5 (five) minutes as needed for chest pain.   SYNJARDY  XR 12.04-999 MG TB24; Take 2 tablets by mouth daily.  Primary hypertension Controlled Continue to monitor BP at home. Continue taking aspirin  81mg , amitriptyline  25mg  as prescribed BP Readings from Last 3 Encounters:  09/18/24 120/64  08/09/24 128/78  06/17/24 104/60    Orders:   Comprehensive metabolic panel with GFR   amitriptyline  (ELAVIL ) 25 MG tablet; Take 1 tablet (25 mg total) by mouth at  bedtime.   Mixed hyperlipidemia Controlled Labs drawn today Continue to take Crestor  40mg  Will adjust treatment based on labs Lab Results  Component Value Date   LDLCALC 61 06/17/2024    Orders:   Lipid panel   rosuvastatin  (CRESTOR ) 40 MG tablet; Take 1 tablet (40 mg total) by mouth daily.   Primary insomnia Fatigue, possible vitamin B12 deficiency Possible vitamin B12 deficiency causing fatigue. Discussed potential deficiencies. - Order B12 and folate levels. - Consider B12 supplementation if levels are low. Orders:   B12 and Folate Panel  Encounter for immunization  Orders:   Flu vaccine, recombinant, trivalent, inj  Peripheral neuropathy associated with diabetes mellitus (HCC) Chronic pain due to neuropathy Increased pain due to medication lapse from pharmacy issues. - Transfer pregabalin  prescription to community pharmacy at the med center. - Ensure timely refills to prevent medication lapse.      Body mass index is 29.29 kg/m.      Meds ordered this encounter  Medications   levothyroxine  (SYNTHROID ) 125 MCG tablet    Sig: Take 1 tablet (125 mcg total) by mouth daily.    Dispense:  90 tablet    Refill:  3   nitroGLYCERIN  (NITROSTAT ) 0.4 MG SL tablet    Sig: Place 1 tablet (0.4 mg total) under the tongue every 5 (five) minutes as needed for chest pain.    Dispense:  25 tablet    Refill:  3   amitriptyline  (ELAVIL ) 25 MG tablet    Sig: Take 1 tablet (25 mg total) by mouth at bedtime.    Dispense:  90 tablet    Refill:  2   rosuvastatin  (CRESTOR ) 40 MG tablet    Sig: Take 1 tablet (40 mg total) by mouth daily.    Dispense:  90 tablet    Refill:  3   SYNJARDY  XR 12.04-999 MG TB24    Sig: Take 2 tablets by mouth daily.    Dispense:  60 tablet    Refill:  3   pregabalin  (LYRICA ) 100  MG capsule    Sig: Take 1 capsule (100 mg total) by mouth 3 (three) times daily.    Dispense:  90 capsule    Refill:  2   aspirin  EC (ASPIRIN  LOW DOSE) 81 MG tablet     Sig: Take 1 tablet (81 mg total) by mouth daily.    Dispense:  90 tablet    Refill:  2   TRESIBA  FLEXTOUCH 100 UNIT/ML FlexTouch Pen    Sig: Inject 40 Units into the skin daily.    Dispense:  12 mL    Refill:  6   Semaglutide , 1 MG/DOSE, (OZEMPIC , 1 MG/DOSE,) 4 MG/3ML SOPN    Sig: Inject 1 mg into the skin once a week.    Dispense:  3 mL    Refill:  6    Orders Placed This Encounter  Procedures   Flu vaccine, recombinant, trivalent, inj   B12 and Folate Panel   CBC with Differential/Platelet   Comprehensive metabolic panel with GFR   Hemoglobin A1c   Lipid panel   T4, free   TSH       Follow-up: Return in about 3 months (around 12/19/2024) for Chronic, Nola.  An After Visit Summary was printed and given to the patient.  Nola Angles, [place] [facility] [phone]

## 2024-09-18 ENCOUNTER — Encounter: Payer: Self-pay | Admitting: Physician Assistant

## 2024-09-18 ENCOUNTER — Ambulatory Visit (INDEPENDENT_AMBULATORY_CARE_PROVIDER_SITE_OTHER): Admitting: Physician Assistant

## 2024-09-18 ENCOUNTER — Other Ambulatory Visit (HOSPITAL_BASED_OUTPATIENT_CLINIC_OR_DEPARTMENT_OTHER): Payer: Self-pay

## 2024-09-18 ENCOUNTER — Other Ambulatory Visit: Payer: Self-pay

## 2024-09-18 VITALS — BP 120/64 | HR 72 | Temp 98.2°F | Ht 71.0 in | Wt 210.0 lb

## 2024-09-18 DIAGNOSIS — E1140 Type 2 diabetes mellitus with diabetic neuropathy, unspecified: Secondary | ICD-10-CM | POA: Diagnosis not present

## 2024-09-18 DIAGNOSIS — I10 Essential (primary) hypertension: Secondary | ICD-10-CM | POA: Diagnosis not present

## 2024-09-18 DIAGNOSIS — I2510 Atherosclerotic heart disease of native coronary artery without angina pectoris: Secondary | ICD-10-CM | POA: Diagnosis not present

## 2024-09-18 DIAGNOSIS — F5101 Primary insomnia: Secondary | ICD-10-CM

## 2024-09-18 DIAGNOSIS — E039 Hypothyroidism, unspecified: Secondary | ICD-10-CM

## 2024-09-18 DIAGNOSIS — Z23 Encounter for immunization: Secondary | ICD-10-CM

## 2024-09-18 DIAGNOSIS — E782 Mixed hyperlipidemia: Secondary | ICD-10-CM

## 2024-09-18 DIAGNOSIS — Z794 Long term (current) use of insulin: Secondary | ICD-10-CM

## 2024-09-18 DIAGNOSIS — E1142 Type 2 diabetes mellitus with diabetic polyneuropathy: Secondary | ICD-10-CM

## 2024-09-18 MED ORDER — OZEMPIC (1 MG/DOSE) 4 MG/3ML ~~LOC~~ SOPN
1.0000 mg | PEN_INJECTOR | SUBCUTANEOUS | 6 refills | Status: DC
  Filled 2024-09-18: qty 3, 28d supply, fill #0

## 2024-09-18 MED ORDER — LEVOTHYROXINE SODIUM 125 MCG PO TABS
125.0000 ug | ORAL_TABLET | Freq: Every day | ORAL | 3 refills | Status: AC
  Filled 2024-09-18: qty 90, 90d supply, fill #0
  Filled 2025-01-01: qty 90, 90d supply, fill #1
  Filled 2025-01-06: qty 90, 90d supply, fill #2

## 2024-09-18 MED ORDER — AMITRIPTYLINE HCL 25 MG PO TABS
25.0000 mg | ORAL_TABLET | Freq: Every day | ORAL | 2 refills | Status: AC
  Filled 2024-09-18 – 2024-10-25 (×2): qty 90, 90d supply, fill #0
  Filled 2025-01-06: qty 90, 90d supply, fill #1

## 2024-09-18 MED ORDER — TRESIBA FLEXTOUCH 100 UNIT/ML ~~LOC~~ SOPN
40.0000 [IU] | PEN_INJECTOR | Freq: Every day | SUBCUTANEOUS | 6 refills | Status: AC
  Filled 2024-09-18: qty 12, 30d supply, fill #0
  Filled 2024-10-25: qty 12, 30d supply, fill #1
  Filled 2024-11-19 – 2024-12-05 (×2): qty 12, 30d supply, fill #2
  Filled 2025-01-06: qty 12, 30d supply, fill #3

## 2024-09-18 MED ORDER — PREGABALIN 100 MG PO CAPS
100.0000 mg | ORAL_CAPSULE | Freq: Three times a day (TID) | ORAL | 2 refills | Status: DC
  Filled 2024-09-18 – 2024-09-23 (×2): qty 90, 30d supply, fill #0
  Filled 2024-10-25: qty 90, 30d supply, fill #1
  Filled 2024-11-19: qty 90, 30d supply, fill #2

## 2024-09-18 MED ORDER — NITROGLYCERIN 0.4 MG SL SUBL
0.4000 mg | SUBLINGUAL_TABLET | SUBLINGUAL | 3 refills | Status: AC | PRN
  Filled 2024-09-18: qty 25, 8d supply, fill #0

## 2024-09-18 MED ORDER — ASPIRIN 81 MG PO TBEC
81.0000 mg | DELAYED_RELEASE_TABLET | Freq: Every day | ORAL | 2 refills | Status: AC
  Filled 2024-09-18: qty 90, 90d supply, fill #0
  Filled 2025-01-01: qty 90, 90d supply, fill #1
  Filled 2025-01-06: qty 90, 90d supply, fill #2

## 2024-09-18 MED ORDER — SYNJARDY XR 12.5-1000 MG PO TB24
2.0000 | ORAL_TABLET | Freq: Every day | ORAL | 3 refills | Status: AC
  Filled 2024-09-18: qty 60, 30d supply, fill #0
  Filled 2024-10-25: qty 60, 30d supply, fill #1
  Filled 2024-12-06 – 2024-12-09 (×3): qty 60, 30d supply, fill #2
  Filled 2025-01-06: qty 60, 30d supply, fill #3

## 2024-09-18 MED ORDER — ROSUVASTATIN CALCIUM 40 MG PO TABS
40.0000 mg | ORAL_TABLET | Freq: Every day | ORAL | 3 refills | Status: AC
  Filled 2024-09-18: qty 90, 90d supply, fill #0
  Filled 2025-01-01: qty 90, 90d supply, fill #1
  Filled 2025-01-06: qty 90, 90d supply, fill #2

## 2024-09-18 NOTE — Assessment & Plan Note (Signed)
 Controlled Continue to monitor diet and exercise Labs drawn today Will adjust treatment depending on results Continue taking Crestor  40mg  as prescribed Orders:   nitroGLYCERIN  (NITROSTAT ) 0.4 MG SL tablet; Place 1 tablet (0.4 mg total) under the tongue every 5 (five) minutes as needed for chest pain.   SYNJARDY  XR 12.04-999 MG TB24; Take 2 tablets by mouth daily.

## 2024-09-18 NOTE — Patient Instructions (Signed)
  VISIT SUMMARY: Today, we discussed your diabetes management, chronic pain, hypothyroidism, fatigue, weight gain, periodontal disease, and sleep apnea. We addressed your medication issues and made plans to improve your overall health and well-being.  YOUR PLAN: -TYPE 2 DIABETES MELLITUS: Type 2 diabetes is a condition where your body does not use insulin  properly, leading to high blood sugar levels. We will order an A1c test to monitor your blood sugar levels. Your Synjardy  and Tresiba  prescriptions will be transferred to the community pharmacy at the medical center, while you will continue to get Ozempic  from [facility] We will consider using a G7 or Libre device if your insurance coverage improves.  -CHRONIC PAIN DUE TO NEUROPATHY: Neuropathy is nerve damage that can cause pain, often in the form of burning sensations. We will transfer your pregabalin  prescription to the community pharmacy at the medical center to ensure timely refills and prevent lapses in your medication.  -HYPOTHYROIDISM: Hypothyroidism is a condition where your thyroid  gland does not produce enough thyroid  hormone, leading to fatigue and other symptoms. You will continue taking Synthroid  125 mcg, and we will monitor your thyroid  function tests to ensure stability.  -FATIGUE, POSSIBLE VITAMIN B12 DEFICIENCY: Fatigue can sometimes be caused by a deficiency in vitamin B12. We will order tests to check your B12 and folate levels and consider B12 supplementation if your levels are low.  -OBESITY: Obesity is a condition where excess body fat may affect your health. Despite your increased physical activity, you have experienced weight gain. We encourage you to continue your daily walking routine and monitor your weight and dietary habits.  -PERIODONTAL DISEASE WITH PLANNED DENTAL EXTRACTIONS: Periodontal disease is a serious gum infection that can damage the soft tissue and bone supporting your teeth. You have three loose teeth  scheduled for extraction today, which should help you eat better and improve your diet.  -OBSTRUCTIVE SLEEP APNEA, NOT USING CPAP: Obstructive sleep apnea is a condition where your breathing stops and starts during sleep. You were diagnosed 10-12 years ago but find it difficult to use a CPAP machine. We discussed the importance of managing this condition.  INSTRUCTIONS: 1. Proceed with the A1c test to monitor your blood sugar levels. 2. Sent a transfer of your Synjardy , Tresiba , and pregabalin  prescriptions to the community pharmacy at the medical center. 3. Continue taking Synthroid  125 mcg and monitor your thyroid  function tests. 4. Get your B12 and folate levels tested and consider B12 supplementation if needed. 5. Continue your daily walking routine and monitor your weight and diet. 6. Proceed with the dental extractions as scheduled today.

## 2024-09-18 NOTE — Assessment & Plan Note (Signed)
 Type 2 diabetes mellitus Managed with Ozempic , Synjardy , and Tresiba . 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 Interested in A1c monitoring. - Order A1c test. - Transfer Synjardy  and Tresiba  prescriptions to community pharmacy at the med center. - Continue Ozempic  at [facility] due to supply issues at smaller pharmacies. - Consider G7 or Libre device if insurance coverage improves. Orders:   Hemoglobin A1c   pregabalin  (LYRICA ) 100 MG capsule; Take 1 capsule (100 mg total) by mouth 3 (three) times daily.   aspirin  EC (ASPIRIN  LOW DOSE) 81 MG tablet; Take 1 tablet (81 mg total) by mouth daily.   TRESIBA  FLEXTOUCH 100 UNIT/ML FlexTouch Pen; Inject 40 Units into the skin daily.   Semaglutide , 1 MG/DOSE, (OZEMPIC , 1 MG/DOSE,) 4 MG/3ML SOPN; Inject 1 mg into the skin once a week.

## 2024-09-18 NOTE — Assessment & Plan Note (Signed)
 Fatigue, possible vitamin B12 deficiency Possible vitamin B12 deficiency causing fatigue. Discussed potential deficiencies. - Order B12 and folate levels. - Consider B12 supplementation if levels are low. Orders:   B12 and Folate Panel

## 2024-09-18 NOTE — Assessment & Plan Note (Signed)
 Controlled Labs drawn today Continue to take Crestor  40mg  Will adjust treatment based on labs Lab Results  Component Value Date   LDLCALC 61 06/17/2024    Orders:   Lipid panel   rosuvastatin  (CRESTOR ) 40 MG tablet; Take 1 tablet (40 mg total) by mouth daily.

## 2024-09-18 NOTE — Assessment & Plan Note (Signed)
 Controlled Continue to monitor BP at home. Continue taking aspirin  81mg , amitriptyline  25mg  as prescribed BP Readings from Last 3 Encounters:  09/18/24 120/64  08/09/24 128/78  06/17/24 104/60    Orders:   Comprehensive metabolic panel with GFR   amitriptyline  (ELAVIL ) 25 MG tablet; Take 1 tablet (25 mg total) by mouth at bedtime.

## 2024-09-18 NOTE — Assessment & Plan Note (Signed)
 Hypothyroidism Managed with Synthroid  125 mcg. Currently stable with increased physical activity. - Continue Synthroid  125 mcg. - Monitor thyroid  function tests. Orders:   CBC with Differential/Platelet   T4, free   TSH   levothyroxine  (SYNTHROID ) 125 MCG tablet; Take 1 tablet (125 mcg total) by mouth daily.

## 2024-09-18 NOTE — Assessment & Plan Note (Signed)
 Chronic pain due to neuropathy Increased pain due to medication lapse from pharmacy issues. - Transfer pregabalin  prescription to community pharmacy at the med center. - Ensure timely refills to prevent medication lapse.

## 2024-09-19 LAB — COMPREHENSIVE METABOLIC PANEL WITH GFR
ALT: 24 IU/L (ref 0–44)
AST: 24 IU/L (ref 0–40)
Albumin: 4.2 g/dL (ref 3.9–4.9)
Alkaline Phosphatase: 60 IU/L (ref 47–123)
BUN/Creatinine Ratio: 24 (ref 10–24)
BUN: 27 mg/dL (ref 8–27)
Bilirubin Total: 0.3 mg/dL (ref 0.0–1.2)
CO2: 22 mmol/L (ref 20–29)
Calcium: 9.7 mg/dL (ref 8.6–10.2)
Chloride: 103 mmol/L (ref 96–106)
Creatinine, Ser: 1.11 mg/dL (ref 0.76–1.27)
Globulin, Total: 2.3 g/dL (ref 1.5–4.5)
Glucose: 163 mg/dL — ABNORMAL HIGH (ref 70–99)
Potassium: 5 mmol/L (ref 3.5–5.2)
Sodium: 141 mmol/L (ref 134–144)
Total Protein: 6.5 g/dL (ref 6.0–8.5)
eGFR: 75 mL/min/1.73 (ref >59)

## 2024-09-19 LAB — LIPID PANEL
Chol/HDL Ratio: 4.4 ratio (ref 0.0–5.0)
Cholesterol, Total: 149 mg/dL (ref 100–199)
HDL: 34 mg/dL — ABNORMAL LOW (ref >39)
LDL Chol Calc (NIH): 68 mg/dL (ref 0–99)
Triglycerides: 297 mg/dL — ABNORMAL HIGH (ref 0–149)
VLDL Cholesterol Cal: 47 mg/dL — ABNORMAL HIGH (ref 5–40)

## 2024-09-19 LAB — CBC WITH DIFFERENTIAL/PLATELET
Basophils Absolute: 0.1 x10E3/uL (ref 0.0–0.2)
Basos: 1 %
EOS (ABSOLUTE): 0.4 x10E3/uL (ref 0.0–0.4)
Eos: 4 %
Hematocrit: 42.2 % (ref 37.5–51.0)
Hemoglobin: 13.1 g/dL (ref 13.0–17.7)
Immature Grans (Abs): 0 x10E3/uL (ref 0.0–0.1)
Immature Granulocytes: 0 %
Lymphocytes Absolute: 2.7 x10E3/uL (ref 0.7–3.1)
Lymphs: 30 %
MCH: 26.9 pg (ref 26.6–33.0)
MCHC: 31 g/dL — ABNORMAL LOW (ref 31.5–35.7)
MCV: 87 fL (ref 79–97)
Monocytes Absolute: 0.8 x10E3/uL (ref 0.1–0.9)
Monocytes: 8 %
Neutrophils Absolute: 5.2 x10E3/uL (ref 1.4–7.0)
Neutrophils: 57 %
Platelets: 219 x10E3/uL (ref 150–450)
RBC: 4.87 x10E6/uL (ref 4.14–5.80)
RDW: 15.2 % (ref 11.6–15.4)
WBC: 9 x10E3/uL (ref 3.4–10.8)

## 2024-09-19 LAB — HEMOGLOBIN A1C
Est. average glucose Bld gHb Est-mCnc: 194 mg/dL
Hgb A1c MFr Bld: 8.4 % — ABNORMAL HIGH (ref 4.8–5.6)

## 2024-09-19 LAB — B12 AND FOLATE PANEL
Folate: 14.7 ng/mL (ref >3.0)
Vitamin B-12: 699 pg/mL (ref 232–1245)

## 2024-09-19 LAB — T4, FREE: Free T4: 0.78 ng/dL — ABNORMAL LOW (ref 0.82–1.77)

## 2024-09-19 LAB — TSH: TSH: 12.7 u[IU]/mL — ABNORMAL HIGH (ref 0.450–4.500)

## 2024-09-23 ENCOUNTER — Ambulatory Visit: Payer: Self-pay | Admitting: Physician Assistant

## 2024-09-23 ENCOUNTER — Other Ambulatory Visit (HOSPITAL_BASED_OUTPATIENT_CLINIC_OR_DEPARTMENT_OTHER): Payer: Self-pay

## 2024-09-23 ENCOUNTER — Other Ambulatory Visit: Payer: Self-pay | Admitting: Physician Assistant

## 2024-09-23 ENCOUNTER — Encounter: Payer: Self-pay | Admitting: Oncology

## 2024-09-23 DIAGNOSIS — E039 Hypothyroidism, unspecified: Secondary | ICD-10-CM

## 2024-09-23 DIAGNOSIS — K219 Gastro-esophageal reflux disease without esophagitis: Secondary | ICD-10-CM

## 2024-09-23 DIAGNOSIS — Z794 Long term (current) use of insulin: Secondary | ICD-10-CM

## 2024-09-23 MED ORDER — OMEPRAZOLE 40 MG PO CPDR
40.0000 mg | DELAYED_RELEASE_CAPSULE | Freq: Every day | ORAL | 0 refills | Status: DC
  Filled 2024-09-23: qty 90, 90d supply, fill #0

## 2024-10-03 ENCOUNTER — Telehealth: Payer: Self-pay

## 2024-10-03 NOTE — Telephone Encounter (Signed)
 Copied from CRM [id number]. Topic: Referral - Status >> Oct 03, 2024  9:22 AM Willma SAUNDERS wrote: Reason for CRM: Patient calling to advised no one from Endocrinology has reached out to him. Offered to provide phone number to Endo he was referred to and states he is driving and they should just do what they are supposed to and call. Patient requested a message be sent to PA [employer] to let him know.  Patient can be reached at [phone]

## 2024-10-08 ENCOUNTER — Other Ambulatory Visit (HOSPITAL_BASED_OUTPATIENT_CLINIC_OR_DEPARTMENT_OTHER): Payer: Self-pay

## 2024-10-08 DIAGNOSIS — E039 Hypothyroidism, unspecified: Secondary | ICD-10-CM | POA: Diagnosis not present

## 2024-10-08 MED ORDER — FREESTYLE LIBRE 3 PLUS SENSOR MISC
11 refills | Status: AC
  Filled 2024-10-08 – 2024-10-25 (×2): qty 2, 30d supply, fill #0
  Filled 2024-11-19 – 2025-01-06 (×3): qty 2, 30d supply, fill #1

## 2024-10-08 MED ORDER — OZEMPIC (2 MG/DOSE) 8 MG/3ML ~~LOC~~ SOPN
2.0000 mg | PEN_INJECTOR | SUBCUTANEOUS | 1 refills | Status: DC
  Filled 2024-10-08 – 2024-10-25 (×2): qty 3, 28d supply, fill #0
  Filled 2024-11-19 – 2024-12-05 (×2): qty 3, 28d supply, fill #1

## 2024-10-10 DIAGNOSIS — E113512 Type 2 diabetes mellitus with proliferative diabetic retinopathy with macular edema, left eye: Secondary | ICD-10-CM | POA: Diagnosis not present

## 2024-10-14 ENCOUNTER — Other Ambulatory Visit (HOSPITAL_BASED_OUTPATIENT_CLINIC_OR_DEPARTMENT_OTHER): Payer: Self-pay

## 2024-10-22 ENCOUNTER — Other Ambulatory Visit (HOSPITAL_BASED_OUTPATIENT_CLINIC_OR_DEPARTMENT_OTHER): Payer: Self-pay

## 2024-10-25 ENCOUNTER — Encounter: Payer: Self-pay | Admitting: Oncology

## 2024-10-25 ENCOUNTER — Other Ambulatory Visit (HOSPITAL_BASED_OUTPATIENT_CLINIC_OR_DEPARTMENT_OTHER): Payer: Self-pay

## 2024-10-25 ENCOUNTER — Other Ambulatory Visit: Payer: Self-pay

## 2024-11-19 ENCOUNTER — Other Ambulatory Visit (HOSPITAL_BASED_OUTPATIENT_CLINIC_OR_DEPARTMENT_OTHER): Payer: Self-pay

## 2024-12-02 ENCOUNTER — Other Ambulatory Visit (HOSPITAL_BASED_OUTPATIENT_CLINIC_OR_DEPARTMENT_OTHER): Payer: Self-pay

## 2024-12-05 ENCOUNTER — Other Ambulatory Visit (HOSPITAL_BASED_OUTPATIENT_CLINIC_OR_DEPARTMENT_OTHER): Payer: Self-pay

## 2024-12-06 ENCOUNTER — Other Ambulatory Visit (HOSPITAL_COMMUNITY): Payer: Self-pay

## 2024-12-06 ENCOUNTER — Other Ambulatory Visit: Payer: Self-pay

## 2024-12-06 ENCOUNTER — Encounter: Payer: Self-pay | Admitting: Pharmacist

## 2024-12-06 ENCOUNTER — Other Ambulatory Visit (HOSPITAL_BASED_OUTPATIENT_CLINIC_OR_DEPARTMENT_OTHER): Payer: Self-pay

## 2024-12-07 ENCOUNTER — Other Ambulatory Visit (HOSPITAL_COMMUNITY): Payer: Self-pay

## 2024-12-09 ENCOUNTER — Other Ambulatory Visit: Payer: Self-pay | Admitting: Oncology

## 2024-12-09 ENCOUNTER — Other Ambulatory Visit (HOSPITAL_BASED_OUTPATIENT_CLINIC_OR_DEPARTMENT_OTHER): Payer: Self-pay

## 2024-12-09 DIAGNOSIS — C61 Malignant neoplasm of prostate: Secondary | ICD-10-CM

## 2024-12-10 ENCOUNTER — Telehealth: Payer: Self-pay

## 2024-12-10 ENCOUNTER — Encounter: Payer: Self-pay | Admitting: Oncology

## 2024-12-10 ENCOUNTER — Other Ambulatory Visit: Payer: Self-pay | Admitting: Oncology

## 2024-12-10 ENCOUNTER — Inpatient Hospital Stay

## 2024-12-10 ENCOUNTER — Other Ambulatory Visit (HOSPITAL_COMMUNITY): Payer: Self-pay

## 2024-12-10 ENCOUNTER — Inpatient Hospital Stay: Attending: Oncology

## 2024-12-10 ENCOUNTER — Inpatient Hospital Stay: Admitting: Oncology

## 2024-12-10 ENCOUNTER — Other Ambulatory Visit (HOSPITAL_BASED_OUTPATIENT_CLINIC_OR_DEPARTMENT_OTHER): Payer: Self-pay

## 2024-12-10 VITALS — BP 120/65 | HR 79 | Temp 98.1°F | Resp 16 | Ht 71.0 in | Wt 206.0 lb

## 2024-12-10 DIAGNOSIS — D509 Iron deficiency anemia, unspecified: Secondary | ICD-10-CM | POA: Diagnosis not present

## 2024-12-10 DIAGNOSIS — C61 Malignant neoplasm of prostate: Secondary | ICD-10-CM

## 2024-12-10 DIAGNOSIS — Z15068 Genetic susceptibility to other malignant neoplasm of digestive system: Secondary | ICD-10-CM

## 2024-12-10 DIAGNOSIS — Z803 Family history of malignant neoplasm of breast: Secondary | ICD-10-CM | POA: Diagnosis not present

## 2024-12-10 DIAGNOSIS — Z800 Family history of malignant neoplasm of digestive organs: Secondary | ICD-10-CM | POA: Insufficient documentation

## 2024-12-10 DIAGNOSIS — Z79899 Other long term (current) drug therapy: Secondary | ICD-10-CM | POA: Diagnosis not present

## 2024-12-10 DIAGNOSIS — Z1589 Genetic susceptibility to other disease: Secondary | ICD-10-CM | POA: Diagnosis not present

## 2024-12-10 DIAGNOSIS — D649 Anemia, unspecified: Secondary | ICD-10-CM | POA: Insufficient documentation

## 2024-12-10 DIAGNOSIS — Z1501 Genetic susceptibility to malignant neoplasm of breast: Secondary | ICD-10-CM | POA: Diagnosis not present

## 2024-12-10 DIAGNOSIS — Z1507 Genetic susceptibility to malignant neoplasm of urinary tract: Secondary | ICD-10-CM

## 2024-12-10 DIAGNOSIS — I10 Essential (primary) hypertension: Secondary | ICD-10-CM | POA: Insufficient documentation

## 2024-12-10 DIAGNOSIS — Z1509 Genetic susceptibility to other malignant neoplasm: Secondary | ICD-10-CM | POA: Diagnosis not present

## 2024-12-10 DIAGNOSIS — R918 Other nonspecific abnormal finding of lung field: Secondary | ICD-10-CM | POA: Diagnosis not present

## 2024-12-10 LAB — CBC WITH DIFFERENTIAL (CANCER CENTER ONLY)
Abs Immature Granulocytes: 0.01 K/uL (ref 0.00–0.07)
Basophils Absolute: 0.1 K/uL (ref 0.0–0.1)
Basophils Relative: 1 %
Eosinophils Absolute: 0.3 K/uL (ref 0.0–0.5)
Eosinophils Relative: 4 %
HCT: 36.3 % — ABNORMAL LOW (ref 39.0–52.0)
Hemoglobin: 11.2 g/dL — ABNORMAL LOW (ref 13.0–17.0)
Immature Granulocytes: 0 %
Lymphocytes Relative: 31 %
Lymphs Abs: 2.3 K/uL (ref 0.7–4.0)
MCH: 24.3 pg — ABNORMAL LOW (ref 26.0–34.0)
MCHC: 30.9 g/dL (ref 30.0–36.0)
MCV: 78.9 fL — ABNORMAL LOW (ref 80.0–100.0)
Monocytes Absolute: 0.7 K/uL (ref 0.1–1.0)
Monocytes Relative: 10 %
Neutro Abs: 4.1 K/uL (ref 1.7–7.7)
Neutrophils Relative %: 54 %
Platelet Count: 205 K/uL (ref 150–400)
RBC: 4.6 MIL/uL (ref 4.22–5.81)
RDW: 15.2 % (ref 11.5–15.5)
WBC Count: 7.5 K/uL (ref 4.0–10.5)
nRBC: 0 % (ref 0.0–0.2)

## 2024-12-10 LAB — CMP (CANCER CENTER ONLY)
ALT: 8 U/L (ref 0–44)
AST: 19 U/L (ref 15–41)
Albumin: 4.4 g/dL (ref 3.5–5.0)
Alkaline Phosphatase: 72 U/L (ref 38–126)
Anion gap: 10 (ref 5–15)
BUN: 18 mg/dL (ref 8–23)
CO2: 25 mmol/L (ref 22–32)
Calcium: 10.2 mg/dL (ref 8.9–10.3)
Chloride: 104 mmol/L (ref 98–111)
Creatinine: 1.05 mg/dL (ref 0.61–1.24)
GFR, Estimated: >60 mL/min
Glucose, Bld: 199 mg/dL — ABNORMAL HIGH (ref 70–99)
Potassium: 4.3 mmol/L (ref 3.5–5.1)
Sodium: 138 mmol/L (ref 135–145)
Total Bilirubin: 0.3 mg/dL (ref 0.0–1.2)
Total Protein: 6.9 g/dL (ref 6.5–8.1)

## 2024-12-10 LAB — IRON AND TIBC
Iron: 37 ug/dL — ABNORMAL LOW (ref 45–182)
Saturation Ratios: 8 % — ABNORMAL LOW (ref 17.9–39.5)
TIBC: 484 ug/dL — ABNORMAL HIGH (ref 250–450)
UIBC: 447 ug/dL

## 2024-12-10 LAB — FOLATE: Folate: 17.8 ng/mL

## 2024-12-10 LAB — VITAMIN B12: Vitamin B-12: 525 pg/mL (ref 180–914)

## 2024-12-10 LAB — PSA: Prostatic Specific Antigen: <0.02 ng/mL (ref 0.00–4.00)

## 2024-12-10 LAB — FERRITIN: Ferritin: 7 ng/mL — ABNORMAL LOW (ref 24–336)
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
## 2024-12-10 NOTE — Telephone Encounter (Signed)
 Oral Oncology Patient Advocate Encounter   Received notification that prior authorization for Orgovyx is required.   PA submitted on 12/10/24 Key [id number] Status is pending     Lucie Lamer, CPhT [place]  [facility] Pharm[facility] Services Oncology Pharmacy Patient Advocate Specialist [facility] [place] Phone: [phone]  Fax: [phone] [email or web address]

## 2024-12-11 ENCOUNTER — Encounter: Payer: Self-pay | Admitting: Oncology

## 2024-12-11 ENCOUNTER — Other Ambulatory Visit: Payer: Self-pay

## 2024-12-11 ENCOUNTER — Other Ambulatory Visit (HOSPITAL_COMMUNITY): Payer: Self-pay

## 2024-12-11 ENCOUNTER — Telehealth: Payer: Self-pay

## 2024-12-11 NOTE — Telephone Encounter (Signed)
 Oral Oncology Patient Advocate Encounter  Prior Authorization for Orgovyx  has been approved.    PA# [id number] Effective dates: 12/11/24 through 12/11/25  Patients co-pay is $100.    Lucie Lamer, CPhT [place]  [facility] Specialty Pharmacy Services Oncology Pharmacy Patient Advocate Specialist [facility] [place] Phone: [phone]  Fax: [phone] [email or web address]

## 2024-12-11 NOTE — Telephone Encounter (Signed)
 Oral Oncology Patient Advocate Encounter   Was successful in obtaining a copay card for Orgovyx.  This copay card will make the patients copay $10.  The copay card has an annual maximum benefit of $10,000   The billing information is as follows and has been shared with [facility].   RxBin: [id number] PCN:  Member ID: [id number] Group ID: [id number]    Lucie Lamer, CPhT [place]  [facility] Specialty Pharmacy Services Oncology Pharmacy Patient Advocate Specialist [facility] [place] Phone: [phone]  Fax: [phone] [email or web address]

## 2024-12-13 NOTE — Telephone Encounter (Signed)
 Oral Oncology Pharmacist Encounter  Received new prescription for Orgovyx (relugolix) for the treatment of locally advanced prostate cancer, planned duration until disease progression or unacceptable toxicity. This will be replacing the Lupron .   Labs from 12/10/2024 assessed, no changes needed. Prescription has not been entered at this time for assessment.   Current medication list in Epic reviewed, no significant/ relevant DDIs with Orgovyx identified.   Evaluated chart and no patient barriers to medication adherence noted.   Patient agreement for treatment documented in MD note on 12/10/2024.  Prescription has been e-scribed to the [facility] for benefits analysis and approval.  Oral Oncology Clinic will continue to follow for insurance authorization, copayment issues, initial counseling and start date.  Sherrill Buikema, PharmD Hematology/Oncology Clinical Pharmacist [facility] Oral Chemotherapy Navigation Clinic [phone] 12/13/2024 8:19 AM

## 2024-12-16 ENCOUNTER — Other Ambulatory Visit: Payer: Self-pay

## 2024-12-16 ENCOUNTER — Other Ambulatory Visit: Payer: Self-pay | Admitting: Oncology

## 2024-12-16 ENCOUNTER — Other Ambulatory Visit (HOSPITAL_COMMUNITY): Payer: Self-pay

## 2024-12-16 DIAGNOSIS — C61 Malignant neoplasm of prostate: Secondary | ICD-10-CM

## 2024-12-16 MED ORDER — RELUGOLIX 120 MG PO TABS
ORAL_TABLET | ORAL | 0 refills | Status: DC
  Filled 2024-12-16: qty 30, 28d supply, fill #0

## 2024-12-16 NOTE — Progress Notes (Signed)
 Patient counseled on orgovyx in telephone encounter opened on 12/10/24.   Chevon Laufer, PharmD Hematology/Oncology Clinical Pharmacist [facility] Oral Chemotherapy Navigation Clinic [phone]

## 2024-12-16 NOTE — Telephone Encounter (Signed)
 Oral Chemotherapy Pharmacist Encounter   I spoke with patient for overview of new oral chemotherapy medication: Orgovyx (relugolix) for the treatment of locally advanced prostate cancer, planned duration until disease progression or unacceptable drug toxicity.  Treatment goal: Supportive   Counseled patient on administration, dosing, side effects, monitoring, drug-food interactions, safe handling, storage, and disposal.   Patient will take Orgovyx 120mg  tablets, 3 tablets (360mg ) by mouth on day 1, then will take 1 tablet (120mg ) daily thereafter.    Start date: 12/20/2024   Side effects include but not limited to: hot flashes, fatigue, muscle or joint pain, changes in electrolytes.     Reviewed with patient importance of keeping a medication schedule and plan for any missed doses.   After discussion with patient no patient barriers to medication adherence identified.   Distress thermometer not completed during telephone call as patient has been on previous lines of therapy with Lupron .   Communication and Learning Assessment Primary learner: Patient Barriers to learning: No barriers Preferred language: English Learning preferences: Listening Reading   Patient voiced understanding and appreciation. All questions answered. Medication handout provided.   Provided patient with Oral Chemotherapy Navigation Clinic phone number. Patient knows to call the office with questions or concerns.  Cuahutemoc Attar, PharmD Hematology/Oncology Clinical Pharmacist [facility] Oral Chemotherapy Navigation Clinic [phone] 12/16/2024 11:15 AM

## 2024-12-16 NOTE — Progress Notes (Signed)
 Specialty Pharmacy Initial Fill Coordination Note  Adam Cruz is a 63 y.o. male contacted today regarding refills of specialty medication(s) Relugolix (ORGOVYX) .  Patient requested Delivery  on 12/18/24  to verified address [address]   [place] [address]   Medication will be filled on 12/17/24.   Patient is aware of $10 copayment.   He does have a coupon card on file  Lucie Lamer, CPhT [facility]  [facility] Specialty Pharmacy Services Oncology Pharmacy Patient Advocate Specialist [facility] [place] Phone: [phone]  Fax: [phone] [email or web address]

## 2024-12-18 NOTE — Assessment & Plan Note (Signed)
 Type 2 diabetes mellitus with diabetic polyneuropathy A1c improved to 7.8. Neuropathy symptoms persist, possibly influenced by lumbar spine issues and sciatica. - Continue pregabalin  three times a day. - Encouraged wearing socks at night to alleviate cold-related symptoms. - Provided exercises for lumbar spine and glute strengthening. Orders:   HgB A1c

## 2024-12-18 NOTE — Assessment & Plan Note (Signed)
 SABRA

## 2024-12-18 NOTE — Progress Notes (Unsigned)
 "  Subjective:  Patient ID: Adam Cruz, male    DOB: 07-Jun-1961  Age: 63 y.o. MRN: [id number]  No chief complaint on file.   HPI: Discussed the use of AI scribe software for clinical note transcription with the patient, who gave verbal consent to proceed.  History of Present Illness Adam Cruz is a 63 year old male with diabetes and prostate cancer who presents for a chronic follow-up.  He recently had his levothyroxine  dose adjusted to 125 mcg by an endocrinologist, which he takes in the morning with water. Following this adjustment, his blood test results normalized. He experiences insomnia, waking between 3:30 and 4:00 AM for the past month, possibly related to the change in timing of his thyroid  medication. He takes 2 ten milligram melatonin chewies nightly, which has been his routine for years.  He has been managing prostate cancer and previously received Lupron  injections. He recently switched to an oral medication, starting with three pills yesterday, which made him feel very sleepy, similar to the effects of the Lupron  shot. He obtained the medication at a reduced cost through grants and assistance programs.  He uses the [place] for glucose monitoring, which he finds more reliable than the Dexcom. His recent A1c was 7.8, improved from 8.4. He occasionally indulges in dietary treats, such as a sour cream coconut cake during the holidays. He experiences neuropathy in his toes, particularly in the evening, described as a burning sensation and feeling like something is under his toes. He takes pregabalin  three times a day, which provides some relief, but the burning persists. He wears socks to bed to alleviate symptoms, which worsen without them.  His cholesterol levels have improved, with total cholesterol at 129, triglycerides at 250, and HDL at 37. He notes a concern about his blood pressure, which was 100/60 today, lower than his usual 118-120/70. He attributes past high blood  pressure to cancer medication, which required multiple antihypertensives and led to retinopathy.  He has a history of back issues, exacerbated by chemotherapy, leading to muscle loss and difficulty maintaining posture. He experiences pain after physical activity and has previously benefited from physical therapy, though cost is a barrier. He walks regularly at [employer] and experiences discomfort in his hip area after prolonged walking, which he initially attributed to hip issues but may be related to sciatica.          12/10/2024    8:36 AM 09/18/2024    7:41 AM 08/09/2024    8:00 AM 06/17/2024    7:32 AM 03/13/2024    8:30 AM  Depression screen PHQ 2/9  Decreased Interest 0 0 0 0 0  Down, Depressed, Hopeless 0 0 0 0 0  PHQ - 2 Score 0 0 0 0 0  Altered sleeping  2  0 2  Tired, decreased energy  1  0 2  Change in appetite  0  0 0  Feeling bad or failure about yourself   0  0 0  Trouble concentrating  0  0 0  Moving slowly or fidgety/restless  0  0 0  Suicidal thoughts  0  0 0  PHQ-9 Score  3   0  4   Difficult doing work/chores  Not difficult at all  Not difficult at all Not difficult at all     Data saved with a previous flowsheet row definition        06/17/2024    7:32 AM  Fall Risk   Falls  in the past year? 0  Number falls in past yr: 0  Injury with Fall? 0   Risk for fall due to : No Fall Risks  Follow up Falls evaluation completed     Data saved with a previous flowsheet row definition    Patient Care Team: Milon Cleaves, [place] as PCP - General (Physician Assistant) Revankar, Jennifer SAUNDERS, MD as PCP - Cardiology (Cardiology)   Review of Systems  Constitutional:  Negative for chills, fatigue and fever.  HENT:  Negative for congestion, ear pain and sore throat.   Respiratory:  Negative for cough and shortness of breath.   Cardiovascular:  Negative for chest pain and palpitations.  Gastrointestinal:  Negative for abdominal pain, constipation, diarrhea, nausea and  vomiting.  Genitourinary:  Negative for difficulty urinating and dysuria.  Musculoskeletal:  Negative for arthralgias, back pain and myalgias.  Skin:  Negative for rash.  Neurological:  Negative for dizziness and headaches.  Psychiatric/Behavioral:  Negative for dysphoric mood.     Medications Ordered Prior to Encounter[1] Past Medical History:  Diagnosis Date   Acute pain due to injury 10/04/2016   Atherosclerotic heart disease of native coronary artery with other forms of angina pectoris    BRCA2 positive 09/08/2017   Burn any degree involving less than 10 percent of body surface 10/04/2016   CAD (coronary artery disease) 10/04/2013   Cardiac murmur 06/06/2022   COPD with acute bronchitis (HCC) 03/01/2019   Coronary artery disease of native artery of native heart with stable angina pectoris    Cough    Diabetes mellitus (HCC) 08/02/2013   Family history of breast cancer    Family history of breast cancer in male    Family history of pancreatic cancer    Genetic testing 09/08/2017   BRCA2 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  The Hereditary Gene Panel offered by [facility] includes sequencing and/or deletion duplication testing of the following 46 genes: APC, ATM, AXIN2, BARD1, BMPR1A, BRCA1, BRCA2, BRIP1, CDH1, CDKN2A (p14ARF), CDKN2A (p16INK4a), CHEK2, CTNNA1, DICER1, EPCAM (Deletion/duplication testing only), G   Hyperlipidemia    Hypertension    Hypokalemia 03/01/2019   Hypothyroidism    Malignant neoplasm of prostate (HCC) 07/28/2017   MI (myocardial infarction) (HCC) 2014   Mixed hyperlipidemia    Type 2 diabetes mellitus with right eye affected by proliferative retinopathy and macular edema, with long-term current use of insulin  (HCC)    Upper respiratory tract infection    Past Surgical History:  Procedure Laterality Date   APPENDECTOMY     CARDIAC CATHETERIZATION  2014   CATARACT EXTRACTION Right  02/12/2019   PROSTATE BIOPSY     RETINAL DETACHMENT SURGERY     Pneumatic retinopexy   SHOULDER SURGERY      Family History  Problem Relation Age of Onset   Breast cancer Mother 68   Pancreatic cancer Mother 35   Breast cancer Maternal Grandfather        dx in his 20s   Breast cancer Daughter 47       reportedly BRCA pos   Cirrhosis Father 66       non alcoholic cirrhosis   Dementia Maternal Grandmother    Dementia Paternal Aunt    Social History   Socioeconomic History   Marital status: Married    Spouse name: Not on file   Number of children: Not on file   Years of education: Not on file   Highest education level:  12th grade  Occupational History   Not on file  Tobacco Use   Smoking status: Never   Smokeless tobacco: Never  Vaping Use   Vaping status: Never Used  Substance and Sexual Activity   Alcohol  use: Never   Drug use: Never   Sexual activity: Yes  Other Topics Concern   Not on file  Social History Narrative   Not on file   Social Drivers of Health   Tobacco Use: Low Risk (12/10/2024)   Patient History    Smoking Tobacco Use: Never    Smokeless Tobacco Use: Never    Passive Exposure: Not on file  Financial Resource Strain: Low Risk (12/16/2024)   Overall Financial Resource Strain (CARDIA)    Difficulty of Paying Living Expenses: Not very hard  Food Insecurity: No Food Insecurity (12/16/2024)   Epic    Worried About Radiation Protection Practitioner of Food in the Last Year: Never true    Ran Out of Food in the Last Year: Never true  Transportation Needs: No Transportation Needs (12/16/2024)   Epic    Lack of Transportation (Medical): No    Lack of Transportation (Non-Medical): No  Physical Activity: Insufficiently Active (12/16/2024)   Exercise Vital Sign    Days of Exercise per Week: 3 days    Minutes of Exercise per Session: 30 min  Stress: No Stress Concern Present (12/16/2024)   [employer] of Occupational Health - Occupational Stress Questionnaire     Feeling of Stress: Not at all  Social Connections: Socially Integrated (12/16/2024)   Social Connection and Isolation Panel    Frequency of Communication with Friends and Family: Three times a week    Frequency of Social Gatherings with Friends and Family: Three times a week    Attends Religious Services: More than 4 times per year    Active Member of Clubs or Organizations: Yes    Attends Banker Meetings: More than 4 times per year    Marital Status: Married  Depression (PHQ2-9): Low Risk (12/10/2024)   Depression (PHQ2-9)    PHQ-2 Score: 0  Alcohol  Screen: Low Risk (09/18/2024)   Alcohol  Screen    Last Alcohol  Screening Score (AUDIT): 0  Housing: Low Risk (12/16/2024)   Epic    Unable to Pay for Housing in the Last Year: No    Number of Times Moved in the Last Year: 0    Homeless in the Last Year: No  Utilities: Not At Risk (09/18/2024)   Epic    Threatened with loss of utilities: No  Health Literacy: Not on file    Objective:  There were no vitals taken for this visit.     12/10/2024    8:33 AM 09/18/2024    7:39 AM 08/09/2024    8:34 AM  BP/Weight  Systolic BP 120 120 128  Diastolic BP 65 64 78  Wt. (Lbs) 206 210 209.2  BMI 28.73 kg/m2 29.29 kg/m2 29.18 kg/m2    Physical Exam Vitals reviewed.  Constitutional:      Appearance: Normal appearance.  Cardiovascular:     Rate and Rhythm: Normal rate and regular rhythm.     Heart sounds: Normal heart sounds.  Pulmonary:     Effort: Pulmonary effort is normal.     Breath sounds: Normal breath sounds.  Abdominal:     General: Bowel sounds are normal.     Palpations: Abdomen is soft.     Tenderness: There is no abdominal tenderness.  Neurological:  Mental Status: He is alert and oriented to person, place, and time.  Psychiatric:        Mood and Affect: Mood normal.        Behavior: Behavior normal.         Lab Results  Component Value Date   WBC 7.5 12/10/2024   HGB 11.2 (L) 12/10/2024    HCT 36.3 (L) 12/10/2024   PLT 205 12/10/2024   GLUCOSE 199 (H) 12/10/2024   CHOL 149 09/18/2024   TRIG 297 (H) 09/18/2024   HDL 34 (L) 09/18/2024   LDLCALC 68 09/18/2024   ALT 8 12/10/2024   AST 19 12/10/2024   NA 138 12/10/2024   K 4.3 12/10/2024   CL 104 12/10/2024   CREATININE 1.05 12/10/2024   BUN 18 12/10/2024   CO2 25 12/10/2024   TSH 12.700 (H) 09/18/2024   PSA <0.1 11/22/2023   INR 1.1 03/01/2019   HGBA1C 8.4 (H) 09/18/2024    Results for orders placed or performed in visit on 12/10/24  PSA   Collection Time: 12/10/24  8:14 AM  Result Value Ref Range   Prostatic Specific Antigen <0.02 0.00 - 4.00 ng/mL  Vitamin B12   Collection Time: 12/10/24  8:14 AM  Result Value Ref Range   Vitamin B-12 525 180 - 914 pg/mL  Iron and TIBC   Collection Time: 12/10/24  8:14 AM  Result Value Ref Range   Iron 37 (L) 45 - 182 ug/dL   TIBC 515 (H) 749 - 549 ug/dL   Saturation Ratios 8 (L) 17.9 - 39.5 %   UIBC 447 ug/dL  Folate   Collection Time: 12/10/24  8:14 AM  Result Value Ref Range   Folate 17.8 >5.9 ng/mL  Ferritin   Collection Time: 12/10/24  8:14 AM  Result Value Ref Range   Ferritin 7 (L) 24 - 336 ng/mL  CMP (Cancer Center only)   Collection Time: 12/10/24  8:15 AM  Result Value Ref Range   Sodium 138 135 - 145 mmol/L   Potassium 4.3 3.5 - 5.1 mmol/L   Chloride 104 98 - 111 mmol/L   CO2 25 22 - 32 mmol/L   Glucose, Bld 199 (H) 70 - 99 mg/dL   BUN 18 8 - 23 mg/dL   Creatinine 8.94 9.38 - 1.24 mg/dL   Calcium  10.2 8.9 - 10.3 mg/dL   Total Protein 6.9 6.5 - 8.1 g/dL   Albumin 4.4 3.5 - 5.0 g/dL   AST 19 15 - 41 U/L   ALT 8 0 - 44 U/L   Alkaline Phosphatase 72 38 - 126 U/L   Total Bilirubin 0.3 0.0 - 1.2 mg/dL   GFR, Estimated >39 >39 mL/min   Anion gap 10 5 - 15  CBC with Differential (Cancer Center Only)   Collection Time: 12/10/24  8:15 AM  Result Value Ref Range   WBC Count 7.5 4.0 - 10.5 K/uL   RBC 4.60 4.22 - 5.81 MIL/uL   Hemoglobin 11.2 (L) 13.0 -  17.0 g/dL   HCT 63.6 (L) 60.9 - 47.9 %   MCV 78.9 (L) 80.0 - 100.0 fL   MCH 24.3 (L) 26.0 - 34.0 pg   MCHC 30.9 30.0 - 36.0 g/dL   RDW 84.7 88.4 - 84.4 %   Platelet Count 205 150 - 400 K/uL   nRBC 0.0 0.0 - 0.2 %   Neutrophils Relative % 54 %   Neutro Abs 4.1 1.7 - 7.7 K/uL   Lymphocytes Relative 31 %  Lymphs Abs 2.3 0.7 - 4.0 K/uL   Monocytes Relative 10 %   Monocytes Absolute 0.7 0.1 - 1.0 K/uL   Eosinophils Relative 4 %   Eosinophils Absolute 0.3 0.0 - 0.5 K/uL   Basophils Relative 1 %   Basophils Absolute 0.1 0.0 - 0.1 K/uL   Immature Granulocytes 0 %   Abs Immature Granulocytes 0.01 0.00 - 0.07 K/uL  .  Assessment & Plan:   Assessment & Plan Type 2 diabetes mellitus with diabetic neuropathy, with long-term current use of insulin  (HCC) Type 2 diabetes mellitus with diabetic polyneuropathy A1c improved to 7.8. Neuropathy symptoms persist, possibly influenced by lumbar spine issues and sciatica. - Continue pregabalin  three times a day. - Encouraged wearing socks at night to alleviate cold-related symptoms. - Provided exercises for lumbar spine and glute strengthening. Orders:   HgB A1c  Acquired hypothyroidism Acquired hypothyroidism Thyroid  function improved with levothyroxine  125 mcg. Timing adjustment may contribute to insomnia. - Continue levothyroxine  125 mcg in the morning. - Monitor for changes in insomnia symptoms.    Gastroesophageal reflux disease, unspecified whether esophagitis present Controlled Denies any new or worsening symptoms Will adjust treatment based on symptoms Orders:   omeprazole  (PRILOSEC) 40 MG capsule; Take 1 capsule (40 mg total) by mouth daily.  Mixed hyperlipidemia Mixed hyperlipidemia Cholesterol levels improved. - Continue current lipid-lowering therapy. Orders:   POCT Lipid Panel  Primary hypertension Primary hypertension Blood pressure at 100/60, lower than usual. Recent medication changes may contribute. - Rechecked  blood pressure to ensure it was not too low. - Informed oncologist of low blood pressure readings. BP Readings from Last 3 Encounters:  12/20/24 104/64  12/10/24 120/65  09/18/24 120/64       Coronary artery disease involving native coronary artery of native heart without angina pectoris Continue to monitor diet and exercise Continue taking Synjardy  XR 12.5-1000mg  as prescribed Continue to follow up with cardiology    Primary insomnia Primary insomnia Insomnia possibly related to thyroid  medication timing. Melatonin effectiveness may be decreasing. - Monitor sleep patterns and adjust melatonin dosage if necessary.    Peripheral neuropathy associated with diabetes mellitus (HCC) Admits to symptoms continuing and getting worse at night Will attempt lumbar exercises to improve sciatica and neuropathy If no improvement will consider medicine adjustment or referral for physical therapy    Malignant neoplasm of prostate [facility]) Prostate cancer Transitioning to oral medication due to cost. Initial side effects include drowsiness. PET scan planned due to low red blood cells and history of lymphadenopathy. - Continue oral medication for prostate cancer. - Proceed with PET scan to monitor for metastasis.    Back pain of lumbar region with sciatica Low back pain with sciatica Chronic low back pain with sciatica, possibly exacerbated by muscle atrophy from previous chemotherapy. - Provided exercises for lumbar spine and glute strengthening. - Encouraged continuation of physical activity, such as walking.      There is no height or weight on file to calculate BMI.    No orders of the defined types were placed in this encounter.   No orders of the defined types were placed in this encounter.      Follow-up: No follow-ups on file.  An After Visit Summary was printed and given to the patient.  Nola Angles, [place] [facility] [phone]     [1]  Current  Outpatient Medications on File Prior to Visit  Medication Sig Dispense Refill   amitriptyline  (ELAVIL ) 25 MG tablet Take 1 tablet (25  mg total) by mouth at bedtime. 90 tablet 2   aspirin  EC (ASPIRIN  LOW DOSE) 81 MG tablet Take 1 tablet (81 mg total) by mouth daily. 90 tablet 2   Cholecalciferol (VITAMIN D3) 125 MCG (5000 UT) CAPS Take 1 capsule (5,000 Units total) by mouth daily. 30 capsule 3   Continuous Glucose Sensor (FREESTYLE LIBRE 3 PLUS SENSOR) MISC Use as directed and apply 1 sensor every 15 days. 2 each 11   levothyroxine  (SYNTHROID ) 125 MCG tablet Take 1 tablet (125 mcg total) by mouth daily. 90 tablet 3   Melatonin 10 MG CHEW Chew 1 tablet by mouth at bedtime as needed. 30 tablet 3   nitroGLYCERIN  (NITROSTAT ) 0.4 MG SL tablet Place 1 tablet (0.4 mg total) under the tongue every 5 (five) minutes as needed for chest pain. 25 tablet 3   NOVOLOG  FLEXPEN 100 UNIT/ML FlexPen Inject 5 Units into the skin with breakfast, with lunch, and with evening meal. 15 mL 1   omeprazole  (PRILOSEC) 40 MG capsule Take 1 capsule (40 mg total) by mouth daily. 90 capsule 0   pregabalin  (LYRICA ) 100 MG capsule Take 1 capsule (100 mg total) by mouth 3 (three) times daily. 90 capsule 2   relugolix (ORGOVYX) 120 MG tablet Take 3 tablets (360 mg total) by mouth on day 1, then take 1 tablet (120 mg total) daily thereafter. 30 tablet 0   rosuvastatin  (CRESTOR ) 40 MG tablet Take 1 tablet (40 mg total) by mouth daily. 90 tablet 3   Semaglutide , 2 MG/DOSE, (OZEMPIC , 2 MG/DOSE,) 8 MG/3ML SOPN Inject 2 mg into the skin once a week. 3 mL 1   SYNJARDY  XR 12.04-999 MG TB24 Take 2 tablets by mouth daily. 60 tablet 3   TRESIBA  FLEXTOUCH 100 UNIT/ML FlexTouch Pen Inject 40 Units into the skin daily. 12 mL 6   No current facility-administered medications on file prior to visit.   "

## 2024-12-18 NOTE — Assessment & Plan Note (Signed)
 Acquired hypothyroidism Thyroid  function improved with levothyroxine  125 mcg. Timing adjustment may contribute to insomnia. - Continue levothyroxine  125 mcg in the morning. - Monitor for changes in insomnia symptoms.

## 2024-12-18 NOTE — Assessment & Plan Note (Signed)
 Adam Cruz

## 2024-12-19 ENCOUNTER — Encounter: Payer: Self-pay | Admitting: Oncology

## 2024-12-19 DIAGNOSIS — D509 Iron deficiency anemia, unspecified: Secondary | ICD-10-CM | POA: Insufficient documentation

## 2024-12-20 ENCOUNTER — Encounter: Payer: Self-pay | Admitting: Oncology

## 2024-12-20 ENCOUNTER — Encounter: Payer: Self-pay | Admitting: Physician Assistant

## 2024-12-20 ENCOUNTER — Other Ambulatory Visit: Payer: Self-pay

## 2024-12-20 ENCOUNTER — Other Ambulatory Visit (HOSPITAL_BASED_OUTPATIENT_CLINIC_OR_DEPARTMENT_OTHER): Payer: Self-pay

## 2024-12-20 ENCOUNTER — Ambulatory Visit (INDEPENDENT_AMBULATORY_CARE_PROVIDER_SITE_OTHER): Admitting: Physician Assistant

## 2024-12-20 VITALS — BP 104/64 | HR 78 | Temp 98.2°F | Resp 18 | Ht 71.0 in | Wt 207.6 lb

## 2024-12-20 DIAGNOSIS — Z794 Long term (current) use of insulin: Secondary | ICD-10-CM

## 2024-12-20 DIAGNOSIS — I10 Essential (primary) hypertension: Secondary | ICD-10-CM | POA: Diagnosis not present

## 2024-12-20 DIAGNOSIS — F5101 Primary insomnia: Secondary | ICD-10-CM | POA: Diagnosis not present

## 2024-12-20 DIAGNOSIS — K219 Gastro-esophageal reflux disease without esophagitis: Secondary | ICD-10-CM | POA: Diagnosis not present

## 2024-12-20 DIAGNOSIS — M5440 Lumbago with sciatica, unspecified side: Secondary | ICD-10-CM

## 2024-12-20 DIAGNOSIS — E039 Hypothyroidism, unspecified: Secondary | ICD-10-CM | POA: Diagnosis not present

## 2024-12-20 DIAGNOSIS — E1142 Type 2 diabetes mellitus with diabetic polyneuropathy: Secondary | ICD-10-CM

## 2024-12-20 DIAGNOSIS — E782 Mixed hyperlipidemia: Secondary | ICD-10-CM | POA: Diagnosis not present

## 2024-12-20 DIAGNOSIS — C61 Malignant neoplasm of prostate: Secondary | ICD-10-CM

## 2024-12-20 DIAGNOSIS — E1140 Type 2 diabetes mellitus with diabetic neuropathy, unspecified: Secondary | ICD-10-CM | POA: Diagnosis not present

## 2024-12-20 DIAGNOSIS — I2510 Atherosclerotic heart disease of native coronary artery without angina pectoris: Secondary | ICD-10-CM

## 2024-12-20 LAB — POCT GLYCOSYLATED HEMOGLOBIN (HGB A1C): HbA1c POC (<> result, manual entry): 7.8 %

## 2024-12-20 LAB — POCT LIPID PANEL
HDL: 37
LDL: 42
Non-HDL: 92
TC/HDL: 1.1
TC: 129
TRG: 250

## 2024-12-20 MED ORDER — OMEPRAZOLE 40 MG PO CPDR
40.0000 mg | DELAYED_RELEASE_CAPSULE | Freq: Every day | ORAL | 3 refills | Status: AC
  Filled 2024-12-20: qty 90, 90d supply, fill #0
  Filled 2025-01-06: qty 90, 90d supply, fill #1

## 2024-12-20 NOTE — Assessment & Plan Note (Signed)
 Prostate cancer Transitioning to oral medication due to cost. Initial side effects include drowsiness. PET scan planned due to low red blood cells and history of lymphadenopathy. - Continue oral medication for prostate cancer. - Proceed with PET scan to monitor for metastasis.

## 2024-12-20 NOTE — Assessment & Plan Note (Signed)
 Low back pain with sciatica Chronic low back pain with sciatica, possibly exacerbated by muscle atrophy from previous chemotherapy. - Provided exercises for lumbar spine and glute strengthening. - Encouraged continuation of physical activity, such as walking.

## 2024-12-23 ENCOUNTER — Telehealth: Payer: Self-pay

## 2024-12-23 DIAGNOSIS — E1140 Type 2 diabetes mellitus with diabetic neuropathy, unspecified: Secondary | ICD-10-CM

## 2024-12-23 MED ORDER — PREGABALIN 100 MG PO CAPS
100.0000 mg | ORAL_CAPSULE | Freq: Three times a day (TID) | ORAL | 2 refills | Status: AC
  Filled 2024-12-23: qty 90, 30d supply, fill #0
  Filled 2025-01-06 – 2025-01-22 (×2): qty 90, 30d supply, fill #1

## 2024-12-23 NOTE — Telephone Encounter (Signed)
-----   Message from Wanda Cornish, MD sent at 12/19/2024  7:24 PM EST ----- Regarding: call Tell him his B12 and folate levels are good but he is very low on iron.  That is usually caused by blood loss so we will need to discuss at his appt regarding GI referral.  For now, I rec oral iron supplement 1 daily and we will recheck when he returns.  If unable to tolerate oral iron, we can give IV

## 2024-12-23 NOTE — Telephone Encounter (Signed)
 Called patient and notified him of message. Patient will start taking an oral iron supplement.   Patient questioning if we ever figured out why the PET scan was denied and what is the next step for this./

## 2024-12-24 ENCOUNTER — Other Ambulatory Visit (HOSPITAL_BASED_OUTPATIENT_CLINIC_OR_DEPARTMENT_OTHER): Payer: Self-pay

## 2024-12-25 ENCOUNTER — Telehealth: Payer: Self-pay | Admitting: Oncology

## 2024-12-25 NOTE — Telephone Encounter (Signed)
 12/25/24 Spoke to patient about pending PET SCAN. Dr Cornelius to call to request approval for authorization.

## 2024-12-26 ENCOUNTER — Telehealth: Payer: Self-pay

## 2024-12-26 NOTE — Telephone Encounter (Signed)
 Pt is taking the Orgovyx  daily in am. No missed doses. He states, I can't think of anything new that has happened since starting it. See the chemo f/u call flowsheet for further assessment of symptoms/side effects. Pt reminded to call us  if he develops temp of 100.4 or higher, day or night. He verbalized understanding. He did acknowledge he has started taking an iron pill as instructed. I told him the iron may make his BM black in color. He stated ok, and said he knew it could constipate him but he is taking stool softener everyday. Med list updated. Message sent to Kaitlyn Schomburg,RPH and Dr Cornelius.

## 2025-01-02 ENCOUNTER — Other Ambulatory Visit: Payer: Self-pay

## 2025-01-02 ENCOUNTER — Encounter: Payer: Self-pay | Admitting: Oncology

## 2025-01-02 NOTE — Progress Notes (Signed)
 Specialty Pharmacy Ongoing Clinical Assessment Note  Adam Cruz is a 64 y.o. male who is being followed by the specialty pharmacy service for RxSp Oncology   Patient's specialty medication(s) reviewed today: Relugolix  (ORGOVYX )   Missed doses in the last 4 weeks: 0   Patient/Caregiver did not have any additional questions or concerns.   Therapeutic benefit summary: Unable to assess   Adverse events/side effects summary: No adverse events/side effects   Patient's therapy is appropriate to: Continue    Goals Addressed             This Visit's Progress    Maintain optimal adherence to therapy   On track    Patient is on track. Patient will maintain adherence         Follow up: 3 months  [facility] Specialty Pharmacist

## 2025-01-06 ENCOUNTER — Other Ambulatory Visit (HOSPITAL_BASED_OUTPATIENT_CLINIC_OR_DEPARTMENT_OTHER): Payer: Self-pay

## 2025-01-06 MED ORDER — OZEMPIC (2 MG/DOSE) 8 MG/3ML ~~LOC~~ SOPN
2.0000 mg | PEN_INJECTOR | SUBCUTANEOUS | 1 refills | Status: AC
  Filled 2025-01-06: qty 3, 28d supply, fill #0

## 2025-01-07 ENCOUNTER — Other Ambulatory Visit: Payer: Self-pay | Admitting: Oncology

## 2025-01-07 ENCOUNTER — Other Ambulatory Visit: Payer: Self-pay

## 2025-01-07 ENCOUNTER — Other Ambulatory Visit (HOSPITAL_BASED_OUTPATIENT_CLINIC_OR_DEPARTMENT_OTHER): Payer: Self-pay

## 2025-01-07 ENCOUNTER — Other Ambulatory Visit (HOSPITAL_COMMUNITY): Payer: Self-pay

## 2025-01-07 DIAGNOSIS — C61 Malignant neoplasm of prostate: Secondary | ICD-10-CM

## 2025-01-07 NOTE — Progress Notes (Signed)
 Specialty Pharmacy Refill Coordination Note  Spoke with Adam VANDERVEER  [name] is a 64 y.o. male contacted today regarding refills of specialty medication(s) Relugolix  (ORGOVYX )  Doses on hand: 8  Patient requested: Delivery   Delivery date: 01/09/25   Verified address: [address] [place] [address]  Medication will be filled on 01/08/25  This fill date is pending response to refill request from provider. Patient is aware and if they have not received fill by intended date, they must follow up with pharmacy.

## 2025-01-08 ENCOUNTER — Other Ambulatory Visit (HOSPITAL_COMMUNITY): Payer: Self-pay

## 2025-01-08 ENCOUNTER — Other Ambulatory Visit: Payer: Self-pay

## 2025-01-08 MED ORDER — ORGOVYX 120 MG PO TABS
120.0000 mg | ORAL_TABLET | Freq: Every day | ORAL | 0 refills | Status: AC
  Filled 2025-01-08: qty 30, 30d supply, fill #0

## 2025-01-10 ENCOUNTER — Encounter: Payer: Self-pay | Admitting: Oncology

## 2025-01-10 ENCOUNTER — Inpatient Hospital Stay: Attending: Oncology | Admitting: Oncology

## 2025-01-10 ENCOUNTER — Other Ambulatory Visit: Payer: Self-pay | Admitting: Oncology

## 2025-01-10 ENCOUNTER — Telehealth: Payer: Self-pay

## 2025-01-10 ENCOUNTER — Telehealth: Payer: Self-pay | Admitting: Oncology

## 2025-01-10 ENCOUNTER — Inpatient Hospital Stay

## 2025-01-10 VITALS — BP 113/76 | HR 71 | Temp 97.9°F | Resp 16 | Ht 71.0 in | Wt 202.9 lb

## 2025-01-10 DIAGNOSIS — Z15068 Genetic susceptibility to other malignant neoplasm of digestive system: Secondary | ICD-10-CM | POA: Diagnosis not present

## 2025-01-10 DIAGNOSIS — C61 Malignant neoplasm of prostate: Secondary | ICD-10-CM | POA: Diagnosis not present

## 2025-01-10 DIAGNOSIS — Z1589 Genetic susceptibility to other disease: Secondary | ICD-10-CM | POA: Diagnosis not present

## 2025-01-10 DIAGNOSIS — Z1501 Genetic susceptibility to malignant neoplasm of breast: Secondary | ICD-10-CM | POA: Diagnosis not present

## 2025-01-10 DIAGNOSIS — Z1509 Genetic susceptibility to other malignant neoplasm: Secondary | ICD-10-CM

## 2025-01-10 DIAGNOSIS — Z1507 Genetic susceptibility to malignant neoplasm of urinary tract: Secondary | ICD-10-CM

## 2025-01-10 LAB — CBC WITH DIFFERENTIAL (CANCER CENTER ONLY)
Abs Immature Granulocytes: 0.02 K/uL (ref 0.00–0.07)
Basophils Absolute: 0 K/uL (ref 0.0–0.1)
Basophils Relative: 0 %
Eosinophils Absolute: 0.5 K/uL (ref 0.0–0.5)
Eosinophils Relative: 6 %
HCT: 40.4 % (ref 39.0–52.0)
Hemoglobin: 12.4 g/dL — ABNORMAL LOW (ref 13.0–17.0)
Immature Granulocytes: 0 %
Lymphocytes Relative: 23 %
Lymphs Abs: 1.8 K/uL (ref 0.7–4.0)
MCH: 24.1 pg — ABNORMAL LOW (ref 26.0–34.0)
MCHC: 30.7 g/dL (ref 30.0–36.0)
MCV: 78.6 fL — ABNORMAL LOW (ref 80.0–100.0)
Monocytes Absolute: 0.6 K/uL (ref 0.1–1.0)
Monocytes Relative: 8 %
Neutro Abs: 4.8 K/uL (ref 1.7–7.7)
Neutrophils Relative %: 63 %
Platelet Count: 221 K/uL (ref 150–400)
RBC: 5.14 MIL/uL (ref 4.22–5.81)
RDW: 18.6 % — ABNORMAL HIGH (ref 11.5–15.5)
WBC Count: 7.7 K/uL (ref 4.0–10.5)
nRBC: 0 % (ref 0.0–0.2)

## 2025-01-10 LAB — CMP (CANCER CENTER ONLY)
ALT: 16 U/L (ref 0–44)
AST: 24 U/L (ref 15–41)
Albumin: 4.2 g/dL (ref 3.5–5.0)
Alkaline Phosphatase: 57 U/L (ref 38–126)
Anion gap: 11 (ref 5–15)
BUN: 15 mg/dL (ref 8–23)
CO2: 26 mmol/L (ref 22–32)
Calcium: 10.1 mg/dL (ref 8.9–10.3)
Chloride: 102 mmol/L (ref 98–111)
Creatinine: 1.02 mg/dL (ref 0.61–1.24)
GFR, Estimated: >60 mL/min
Glucose, Bld: 143 mg/dL — ABNORMAL HIGH (ref 70–99)
Potassium: 4.4 mmol/L (ref 3.5–5.1)
Sodium: 140 mmol/L (ref 135–145)
Total Bilirubin: 0.4 mg/dL (ref 0.0–1.2)
Total Protein: 7.3 g/dL (ref 6.5–8.1)

## 2025-01-10 LAB — PSA: Prostatic Specific Antigen: <0.02 ng/mL (ref 0.00–4.00)
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
## 2025-01-10 NOTE — Telephone Encounter (Signed)
 Pt here in clinic for f/u with Dr Cornelius. Pt's wife is her with him. Pt looks well and is feeling good. He denies having any side effects. He is taking the Orgovyx  @ 730a. No missed doses.

## 2025-01-10 NOTE — Telephone Encounter (Signed)
 Patient has been scheduled for follow-up visit per 01/10/2025 LOS.  Pt given an appt calendar with date and time.

## 2025-01-17 ENCOUNTER — Other Ambulatory Visit: Payer: Self-pay | Admitting: Oncology

## 2025-01-17 ENCOUNTER — Ambulatory Visit (HOSPITAL_BASED_OUTPATIENT_CLINIC_OR_DEPARTMENT_OTHER)
Admission: RE | Admit: 2025-01-17 | Discharge: 2025-01-17 | Disposition: A | Source: Ambulatory Visit | Attending: Oncology | Admitting: Oncology

## 2025-01-17 ENCOUNTER — Encounter: Payer: Self-pay | Admitting: Oncology

## 2025-01-17 DIAGNOSIS — C61 Malignant neoplasm of prostate: Secondary | ICD-10-CM

## 2025-01-17 DIAGNOSIS — D509 Iron deficiency anemia, unspecified: Secondary | ICD-10-CM

## 2025-01-17 MED ORDER — IOHEXOL 300 MG/ML  SOLN
100.0000 mL | Freq: Once | INTRAMUSCULAR | Status: AC | PRN
  Administered 2025-01-17: 100 mL via INTRAVENOUS

## 2025-01-22 ENCOUNTER — Other Ambulatory Visit (HOSPITAL_BASED_OUTPATIENT_CLINIC_OR_DEPARTMENT_OTHER): Payer: Self-pay

## 2025-03-24 ENCOUNTER — Ambulatory Visit: Admitting: Physician Assistant

## 2025-04-10 ENCOUNTER — Inpatient Hospital Stay: Admitting: Oncology

## 2025-04-10 ENCOUNTER — Inpatient Hospital Stay
